# Patient Record
Sex: Male | Born: 1945 | Race: White | Hispanic: No | Marital: Single | Smoking: Never smoker
Health system: Southern US, Community
[De-identification: ages and names within clinical notes are randomized; demographics above are authoritative.]

## PROBLEM LIST (undated history)

## (undated) DIAGNOSIS — I639 Cerebral infarction, unspecified: Secondary | ICD-10-CM

## (undated) DIAGNOSIS — H269 Unspecified cataract: Secondary | ICD-10-CM

## (undated) DIAGNOSIS — N189 Chronic kidney disease, unspecified: Secondary | ICD-10-CM

## (undated) DIAGNOSIS — I10 Essential (primary) hypertension: Secondary | ICD-10-CM

## (undated) DIAGNOSIS — F329 Major depressive disorder, single episode, unspecified: Secondary | ICD-10-CM

## (undated) DIAGNOSIS — E11319 Type 2 diabetes mellitus with unspecified diabetic retinopathy without macular edema: Secondary | ICD-10-CM

## (undated) DIAGNOSIS — E119 Type 2 diabetes mellitus without complications: Secondary | ICD-10-CM

## (undated) DIAGNOSIS — F32A Depression, unspecified: Secondary | ICD-10-CM

## (undated) HISTORY — DX: Type 2 diabetes mellitus with unspecified diabetic retinopathy without macular edema: E11.319

## (undated) HISTORY — DX: Chronic kidney disease, unspecified: N18.9

## (undated) HISTORY — DX: Unspecified cataract: H26.9

## (undated) HISTORY — PX: SKIN BIOPSY: SHX1

## (undated) HISTORY — DX: Essential (primary) hypertension: I10

## (undated) HISTORY — DX: Cerebral infarction, unspecified: I63.9

## (undated) HISTORY — DX: Type 2 diabetes mellitus without complications: E11.9

## (undated) HISTORY — DX: Major depressive disorder, single episode, unspecified: F32.9

## (undated) HISTORY — PX: APPENDECTOMY: SHX54

## (undated) HISTORY — DX: Depression, unspecified: F32.A

---

## 2000-07-26 LAB — CBC AND DIFFERENTIAL
HCT: 36 % — AB (ref 41–53)
Hemoglobin: 11.8 g/dL — AB (ref 13.5–17.5)
Platelets: 228 10*3/uL (ref 150–399)
WBC: 7.6 10^3/mL

## 2015-12-29 LAB — HM DIABETES EYE EXAM

## 2016-05-04 LAB — HM COLONOSCOPY

## 2016-06-13 ENCOUNTER — Non-Acute Institutional Stay (SKILLED_NURSING_FACILITY): Payer: Medicare Other | Admitting: Internal Medicine

## 2016-06-13 ENCOUNTER — Encounter: Payer: Self-pay | Admitting: Internal Medicine

## 2016-06-13 DIAGNOSIS — N183 Chronic kidney disease, stage 3 unspecified: Secondary | ICD-10-CM | POA: Insufficient documentation

## 2016-06-13 DIAGNOSIS — N400 Enlarged prostate without lower urinary tract symptoms: Secondary | ICD-10-CM | POA: Diagnosis not present

## 2016-06-13 DIAGNOSIS — E1122 Type 2 diabetes mellitus with diabetic chronic kidney disease: Secondary | ICD-10-CM

## 2016-06-13 DIAGNOSIS — I69391 Dysphagia following cerebral infarction: Secondary | ICD-10-CM | POA: Diagnosis not present

## 2016-06-13 DIAGNOSIS — R561 Post traumatic seizures: Secondary | ICD-10-CM

## 2016-06-13 DIAGNOSIS — G8101 Flaccid hemiplegia affecting right dominant side: Secondary | ICD-10-CM

## 2016-06-13 DIAGNOSIS — E871 Hypo-osmolality and hyponatremia: Secondary | ICD-10-CM

## 2016-06-13 DIAGNOSIS — I119 Hypertensive heart disease without heart failure: Secondary | ICD-10-CM

## 2016-06-13 DIAGNOSIS — K8071 Calculus of gallbladder and bile duct without cholecystitis with obstruction: Secondary | ICD-10-CM

## 2016-06-13 DIAGNOSIS — F329 Major depressive disorder, single episode, unspecified: Secondary | ICD-10-CM | POA: Diagnosis not present

## 2016-06-13 DIAGNOSIS — N3281 Overactive bladder: Secondary | ICD-10-CM

## 2016-06-13 DIAGNOSIS — G4700 Insomnia, unspecified: Secondary | ICD-10-CM

## 2016-06-13 DIAGNOSIS — K5909 Other constipation: Secondary | ICD-10-CM

## 2016-06-13 DIAGNOSIS — L89892 Pressure ulcer of other site, stage 2: Secondary | ICD-10-CM

## 2016-06-13 DIAGNOSIS — K257 Chronic gastric ulcer without hemorrhage or perforation: Secondary | ICD-10-CM

## 2016-06-13 DIAGNOSIS — K5900 Constipation, unspecified: Secondary | ICD-10-CM

## 2016-06-13 DIAGNOSIS — D509 Iron deficiency anemia, unspecified: Secondary | ICD-10-CM | POA: Diagnosis not present

## 2016-06-13 DIAGNOSIS — G894 Chronic pain syndrome: Secondary | ICD-10-CM

## 2016-06-13 DIAGNOSIS — I69920 Aphasia following unspecified cerebrovascular disease: Secondary | ICD-10-CM

## 2016-06-13 DIAGNOSIS — E46 Unspecified protein-calorie malnutrition: Secondary | ICD-10-CM

## 2016-06-13 DIAGNOSIS — Z86718 Personal history of other venous thrombosis and embolism: Secondary | ICD-10-CM | POA: Insufficient documentation

## 2016-06-13 NOTE — Progress Notes (Signed)
LOCATION: [facility]   Code Status: Full Code  Goals of care: Advanced Directive information No flowsheet data found.   No emergency contact information on file.   Allergies  Allergen Reactions  . Latex   . Macrobid [employer] Macro]   . Morphine And Related     Chief Complaint  Patient presents with  . New Admit To SNF    New Admission     HPI:  Patient is a 70 y.o. male seen today for long term care from another SNF/ [facility] in [place]. He has PMH of hypertensive heart disease, CVA with hemiplegia, dysphagia, aphasia, post traumatic seizures, chronic pain syndrome, iron def anemia and gastric ulcer among others. He has been getting wound care to his right foot wound and completed antibiotics for it in august per monthly note at the other SNF. He has medication compliance. No fall reported.     Review of Systems:  Constitutional: Negative for fever, chills, diaphoresis.  HENT: Negative for headache, congestion, nasal discharge. Has dysphagia. Eyes: Negative for blurred vision and discharge.  Respiratory: Negative for cough, shortness of breath and wheezing.   Cardiovascular: Negative for chest pain, palpitations.  Gastrointestinal: Negative for heartburn, nausea, vomiting, abdominal pain. Last bowel movement was yesterday.  Genitourinary: Negative for dysuria and flank pain.  Musculoskeletal: Negative for back pain, fall in the facility.  Skin: Negative for itching, rash.  Neurological: Negative for dizziness Psychiatric/Behavioral: Negative for depression    Past Medical History:  Diagnosis Date  . Cataract   . CKD (chronic kidney disease)   . Depression   . Diabetes mellitus without complication (HCC)   . Diabetic retinopathy (HCC)   . Hypertension   . Stroke [facility])    History reviewed. No pertinent surgical history. Social History:   has no tobacco, alcohol, and drug history on file.  History reviewed. No pertinent family  history.  Medications:   Medication List       Accurate as of 06/13/16  2:25 PM. Always use your most recent med list.          acetaminophen 325 MG tablet Commonly known as:  TYLENOL Take 650 mg by mouth every 4 (four) hours as needed for mild pain.   acetaminophen-codeine 300-60 MG tablet Commonly known as:  TYLENOL #4 Take 1 tablet by mouth every 4 (four) hours as needed for pain.   baclofen 10 MG tablet Commonly known as:  LIORESAL Take 5 mg by mouth 3 (three) times daily.   ferrous sulfate 325 (65 FE) MG tablet Take 325 mg by mouth daily with breakfast.   finasteride 5 MG tablet Commonly known as:  PROSCAR Take 5 mg by mouth daily.   levETIRAcetam 250 MG tablet Commonly known as:  KEPPRA Take 250 mg by mouth 2 (two) times daily.   LORazepam 0.5 MG tablet Commonly known as:  ATIVAN Take 0.25 mg by mouth at bedtime.   losartan 50 MG tablet Commonly known as:  COZAAR Take 50 mg by mouth daily.   Melatonin 10 MG Tabs Take 1 tablet by mouth at bedtime.   metFORMIN 1000 MG tablet Commonly known as:  GLUCOPHAGE Take 1,000 mg by mouth 2 (two) times daily with a meal.   metoprolol tartrate 25 MG tablet Commonly known as:  LOPRESSOR Take 12.5 mg by mouth 2 (two) times daily.   multivitamin tablet Take 1 tablet by mouth daily.   omeprazole 20 MG capsule Commonly known as:  PRILOSEC Take  20 mg by mouth daily.   ondansetron 8 MG tablet Commonly known as:  ZOFRAN Take by mouth every 8 (eight) hours as needed for nausea or vomiting.   oxycodone 5 MG capsule Commonly known as:  OXY-IR Take 10 mg by mouth every 6 (six) hours as needed for pain.   pioglitazone 15 MG tablet Commonly known as:  ACTOS Take 7.5 mg by mouth daily.   polyethylene glycol packet Commonly known as:  MIRALAX / GLYCOLAX Take 17 g by mouth daily.   rivaroxaban 20 MG Tabs tablet Commonly known as:  XARELTO Take 20 mg by mouth daily.   senna 8.6 MG tablet Commonly known as:   SENOKOT Take 2 tablets by mouth 2 (two) times daily.   sertraline 100 MG tablet Commonly known as:  ZOLOFT Take 100 mg by mouth daily. Take along with 50 mg to equal 150 mg   sertraline 50 MG tablet Commonly known as:  ZOLOFT Take 50 mg by mouth daily. Take along with 100 mg to equal 150 mg   sodium chloride 1 g tablet Take 2 g by mouth 2 (two) times daily with a meal.   solifenacin 5 MG tablet Commonly known as:  VESICARE Take 5 mg by mouth daily.   sucralfate 1 g tablet Commonly known as:  CARAFATE Take 1 g by mouth 4 (four) times daily -  with meals and at bedtime.   tamsulosin 0.4 MG Caps capsule Commonly known as:  FLOMAX Take 0.4 mg by mouth daily after supper.   traZODone 50 MG tablet Commonly known as:  DESYREL Take 25 mg by mouth at bedtime.   ursodiol 500 MG tablet Commonly known as:  ACTIGALL Take 500 mg by mouth 2 (two) times daily.   vitamin B-12 1000 MCG tablet Commonly known as:  CYANOCOBALAMIN Take 1,000 mcg by mouth daily.       Immunizations:  There is no immunization history on file for this patient.   Physical Exam:  Vitals:   06/13/16 1406  BP: 127/65  Pulse: 78  Resp: 18  Temp: 98 F (36.7 C)  TempSrc: Oral  SpO2: 97%    General- elderly male, frail, in no acute distress Head- normocephalic, atraumatic Nose- no nasal discharge Throat- moist mucus membrane Eyes- PERRLA, EOMI, no pallor, no icterus, no discharge, normal conjunctiva, normal sclera Neck- no cervical lymphadenopathy Cardiovascular- normal s1,s2, + systolic murmur, trace left leg edema and 1+ right leg edema Respiratory- bilateral clear to auscultation, no wheeze, no rhonchi, no crackles, no use of accessory muscles Abdomen- bowel sounds present, soft, non tender Musculoskeletal- right sided hemiplegia present, on wheelchair, right sided spasticity present Neurological- alert and oriented to self only Skin- warm and dry, dressing to his right foot Psychiatry-  normal mood and affect    Labs reviewed: none available   Assessment/Plan   Hypertensive heart disease Without heart failure. Continue losartan 50 mg daily with metoprolol tartrate 12.5 mg bid and monitor BP and check bmp  Dysphagia SLP to evaluate, aspiration precautions  Aphasia Post CVA, to work with SLP team  Right sided hemiplegia Continue baclofen 5 mg tid for his spasticity  Dm type 2 with ckd No recent a1c for review. Monitor cbg. Continue actos 7.5 mg daily and metformin 1000 mg bid  Chronic depression Continue sertraline 150 mg daily , get psych consult. Continue lorazepam 0.25 mg qhs  BPH continue proscar 5 mg daily with tamsulosin 0.4 mg daily  OAB Continue vesicare 5 mg daily  Iron def anemia Check cbc. Continue feso4 325 mg daily  Post traumatic seizure Remains seizure free. Continue keppra 250 mg bid  Insomnia Continue trazodone 25 mg daily with his melatonin  gerd With gastric ulcer. Continue omeprazole 20 mg daily and carafate. Cbc check  Constipation continue senna 2 tab bid and miralax 17 g daily  Chronic hyponatremia Continue nacl 2 g bid and monitor bmp  ckd stage 3 Monitor bmp  Chronic pain syndrome Continue oxyIR 10 mg q6h prn pain  Cholelithiasis Continue urosidol  History of DVT On xarelto with hx of dvt for stroke prophylaxis.   Right foot ulcer Stage 2 PU to right foot, wound nurse to follow and monitor for signs of infection. Pressure ulcer prophylaxis  Protein calorie malnutrition RD to evaluate, weekly weight check   Goals of care: long term care   Labs/tests ordered: cbc, cmp, lipid panel, a1c, vitamin d  Family/ staff Communication: reviewed care plan with patient and nursing supervisor    Oneal GroutMAHIMA Orry Sigl, MD Internal Medicine [facility] [address] [place], [place] [address] Cell Phone (Monday-Friday 8 am - 5 pm): [phone] On Call: [phone] and follow  prompts after 5 pm and on weekends Office Phone: [phone] Office Fax: [phone]

## 2016-06-29 ENCOUNTER — Non-Acute Institutional Stay (SKILLED_NURSING_FACILITY): Payer: Medicare Other | Admitting: Family

## 2016-06-29 ENCOUNTER — Encounter: Payer: Self-pay | Admitting: Family

## 2016-06-29 DIAGNOSIS — M7989 Other specified soft tissue disorders: Secondary | ICD-10-CM | POA: Diagnosis not present

## 2016-06-29 NOTE — Progress Notes (Signed)
Location:  [facility] Room Number: 1102B Place of Service:  SNF [phone]) Provider: Richarda Blade, FNP-C  Extended Emergency Contact Information Primary Emergency Contact: Kretschmer,Shirley Address: [address]          [place], [place] [address] [place] Lewis of [place] Home Phone: [phone] Mobile Phone: [phone] Relation: Spouse Secondary Emergency Contact: Everitt Lewis Address: [address]          [place], [place] [address] Gilbert Lewis of [place] Home Phone: [phone] Mobile Phone: [phone] Relation: Daughter  Code Status:  Full Code Goals of care: Advanced Directive information Advanced Directives 06/29/2016  Does patient have an advance directive? No     Chief Complaint  Patient presents with  . Acute Visit    HPI:  Pt is a 70 y.o. male seen today at [facility] for an acute visit for evaluation of right leg swelling. He has a significant medical history of HTN, CVA with hemiplegia,Aphasia,Type 2 DM, BPH, Hx DVT among other conditions. He is seen in his room today. Facility Nurse reports worsening right leg swelling.     Past Medical History:  Diagnosis Date  . Cataract   . CKD (chronic kidney disease)   . Depression   . Diabetes mellitus without complication (HCC)   . Diabetic retinopathy (HCC)   . Hypertension   . Stroke [facility])    No past surgical history on file.  Allergies  Allergen Reactions  . Latex   . Macrobid [employer] Macro]   . Morphine And Related       Medication List       Accurate as of 06/29/16  3:53 PM. Always use your most recent med list.          acetaminophen 325 MG tablet Commonly known as:  TYLENOL Take 650 mg by mouth every 4 (four) hours as needed for mild pain.   acetaminophen-codeine 300-60 MG tablet Commonly known as:  TYLENOL #4 Take 1 tablet by mouth every 4 (four) hours as needed for pain.   baclofen 10 MG tablet Commonly  known as:  LIORESAL Take 5 mg by mouth 3 (three) times daily.   ferrous sulfate 325 (65 FE) MG tablet Take 325 mg by mouth daily with breakfast.   finasteride 5 MG tablet Commonly known as:  PROSCAR Take 5 mg by mouth daily.   levETIRAcetam 250 MG tablet Commonly known as:  KEPPRA Take 250 mg by mouth 2 (two) times daily.   LORazepam 0.5 MG tablet Commonly known as:  ATIVAN Take 0.25 mg by mouth at bedtime.   losartan 50 MG tablet Commonly known as:  COZAAR Take 50 mg by mouth daily.   Melatonin 10 MG Tabs Take 1 tablet by mouth at bedtime.   metFORMIN 1000 MG tablet Commonly known as:  GLUCOPHAGE Take 1,000 mg by mouth 2 (two) times daily with a meal.   metoprolol tartrate 25 MG tablet Commonly known as:  LOPRESSOR Take 12.5 mg by mouth 2 (two) times daily.   multivitamin tablet Take 1 tablet by mouth daily.   omeprazole 20 MG capsule Commonly known as:  PRILOSEC Take 20 mg by mouth daily.   ondansetron 8 MG tablet Commonly known as:  ZOFRAN Take by mouth every 8 (eight) hours as needed for nausea or vomiting.   oxycodone 5 MG capsule Commonly known as:  OXY-IR Take 10 mg by mouth every 6 (six) hours as needed for pain.  pioglitazone 15 MG tablet Commonly known as:  ACTOS Take 7.5 mg by mouth daily.   polyethylene glycol packet Commonly known as:  MIRALAX / GLYCOLAX Take 17 g by mouth daily.   PROSTAT PO Take by mouth. 30 ml twice daily for 30 days started 06/16/16   rivaroxaban 20 MG Tabs tablet Commonly known as:  XARELTO Take 20 mg by mouth daily.   senna 8.6 MG tablet Commonly known as:  SENOKOT Take 2 tablets by mouth 2 (two) times daily.   sertraline 100 MG tablet Commonly known as:  ZOLOFT Take 100 mg by mouth daily. Take along with 50 mg to equal 150 mg   sertraline 50 MG tablet Commonly known as:  ZOLOFT Take 50 mg by mouth daily. Take along with 100 mg to equal 150 mg   sodium chloride 1 g tablet Take 2 g by mouth 2 (two) times  daily with a meal.   solifenacin 5 MG tablet Commonly known as:  VESICARE Take 5 mg by mouth daily.   sucralfate 1 g tablet Commonly known as:  CARAFATE Take 1 g by mouth 4 (four) times daily -  with meals and at bedtime.   tamsulosin 0.4 MG Caps capsule Commonly known as:  FLOMAX Take 0.4 mg by mouth daily after supper.   traZODone 50 MG tablet Commonly known as:  DESYREL Take 25 mg by mouth at bedtime.   ursodiol 500 MG tablet Commonly known as:  ACTIGALL Take 500 mg by mouth 2 (two) times daily.   vitamin B-12 1000 MCG tablet Commonly known as:  CYANOCOBALAMIN Take 1,000 mcg by mouth daily.       Review of Systems  Constitutional: Negative for activity change, appetite change, chills, fatigue and fever.  HENT: Negative for congestion, rhinorrhea, sinus pain, sinus pressure, sneezing and sore throat.   Eyes: Negative.   Respiratory: Negative for cough, chest tightness, shortness of breath and wheezing.   Cardiovascular: Positive for leg swelling. Negative for chest pain and palpitations.  Gastrointestinal: Negative for abdominal distention, abdominal pain, constipation, diarrhea, nausea and vomiting.  Genitourinary: Negative for dysuria, flank pain, frequency and urgency.  Musculoskeletal: Positive for gait problem.  Skin: Negative for color change, pallor and rash.  Neurological: Negative for dizziness, seizures and headaches.  Hematological: Does not bruise/bleed easily.  Psychiatric/Behavioral: Negative for agitation, confusion, hallucinations and sleep disturbance. The patient is not nervous/anxious.     Immunization History  Administered Date(s) Administered  . Influenza-Unspecified 07/15/2015  . Pneumococcal-Unspecified 09/02/2013   Pertinent  Health Maintenance Due  Topic Date Due  . HEMOGLOBIN A1C  02-12-1946  . FOOT EXAM  07/18/1957  . OPHTHALMOLOGY EXAM  07/18/1957  . COLONOSCOPY  07/18/1997  . PNA vac Low Risk Adult (1 of 2 - PCV13) 07/18/2012  .  INFLUENZA VACCINE  05/03/2016    Vitals:   06/29/16 1529  BP: 140/74  Pulse: 70  Resp: 20  Temp: 98.8 F (37.1 C)  SpO2: 97%  Weight: 187 lb (84.8 kg)  Height: 6\' 4"  (1.93 m)   Body mass index is 22.76 kg/m. Physical Exam  Constitutional: He appears well-developed and well-nourished. No distress.  HENT:  Head: Normocephalic.  Mouth/Throat: Oropharynx is clear and moist. No oropharyngeal exudate.  Aphasia   Eyes: Conjunctivae and EOM are normal. Pupils are equal, round, and reactive to light. Right eye exhibits no discharge. Left eye exhibits no discharge. No scleral icterus.  Neck: Neck supple. No JVD present.  Cardiovascular: Normal rate, regular rhythm and  intact distal pulses.  Exam reveals no gallop and no friction rub.   No murmur heard. Pulmonary/Chest: Breath sounds normal. No respiratory distress. He has no wheezes. He has no rales.  Abdominal: Soft. Bowel sounds are normal. He exhibits no distension. There is no tenderness. There is no rebound and no guarding.  Musculoskeletal: He exhibits no tenderness or deformity.  Moves x 4 extremities. Right leg non-pitting edema  Lymphadenopathy:    He has no cervical adenopathy.  Neurological: He is alert.  Aphasia   Skin: Skin is warm and dry. No rash noted. No erythema.  Psychiatric: He has a normal mood and affect.    Assessment/Plan Right leg swelling Non-pitting edema, skin warm, non-tender to touch. Will obtain doppler studies to rule out DVT.     Family/ staff Communication: Reviewed plan of care with patient and facility Nurse supervisor.   Labs/tests ordered: doppler studies to rule out DVT.

## 2016-06-30 ENCOUNTER — Emergency Department (HOSPITAL_COMMUNITY): Payer: Medicare Other

## 2016-06-30 ENCOUNTER — Inpatient Hospital Stay (HOSPITAL_COMMUNITY)
Admission: EM | Admit: 2016-06-30 | Discharge: 2016-07-03 | DRG: 301 | Disposition: A | Discharge status: Skilled Nursing Facility | Payer: Medicare Other | Attending: Internal Medicine | Admitting: Internal Medicine

## 2016-06-30 ENCOUNTER — Encounter (HOSPITAL_COMMUNITY): Payer: Self-pay | Admitting: *Deleted

## 2016-06-30 DIAGNOSIS — Z7901 Long term (current) use of anticoagulants: Secondary | ICD-10-CM

## 2016-06-30 DIAGNOSIS — E11319 Type 2 diabetes mellitus with unspecified diabetic retinopathy without macular edema: Secondary | ICD-10-CM | POA: Diagnosis present

## 2016-06-30 DIAGNOSIS — I1310 Hypertensive heart and chronic kidney disease without heart failure, with stage 1 through stage 4 chronic kidney disease, or unspecified chronic kidney disease: Secondary | ICD-10-CM | POA: Diagnosis present

## 2016-06-30 DIAGNOSIS — R4781 Slurred speech: Secondary | ICD-10-CM | POA: Diagnosis not present

## 2016-06-30 DIAGNOSIS — Z7984 Long term (current) use of oral hypoglycemic drugs: Secondary | ICD-10-CM

## 2016-06-30 DIAGNOSIS — Z86718 Personal history of other venous thrombosis and embolism: Secondary | ICD-10-CM | POA: Diagnosis not present

## 2016-06-30 DIAGNOSIS — E1122 Type 2 diabetes mellitus with diabetic chronic kidney disease: Secondary | ICD-10-CM | POA: Diagnosis present

## 2016-06-30 DIAGNOSIS — L8990 Pressure ulcer of unspecified site, unspecified stage: Secondary | ICD-10-CM | POA: Insufficient documentation

## 2016-06-30 DIAGNOSIS — M79604 Pain in right leg: Secondary | ICD-10-CM | POA: Diagnosis present

## 2016-06-30 DIAGNOSIS — K743 Primary biliary cirrhosis: Secondary | ICD-10-CM | POA: Diagnosis present

## 2016-06-30 DIAGNOSIS — I82441 Acute embolism and thrombosis of right tibial vein: Secondary | ICD-10-CM | POA: Diagnosis not present

## 2016-06-30 DIAGNOSIS — F329 Major depressive disorder, single episode, unspecified: Secondary | ICD-10-CM | POA: Diagnosis present

## 2016-06-30 DIAGNOSIS — I82409 Acute embolism and thrombosis of unspecified deep veins of unspecified lower extremity: Secondary | ICD-10-CM | POA: Diagnosis present

## 2016-06-30 DIAGNOSIS — R609 Edema, unspecified: Secondary | ICD-10-CM | POA: Diagnosis not present

## 2016-06-30 DIAGNOSIS — I82401 Acute embolism and thrombosis of unspecified deep veins of right lower extremity: Secondary | ICD-10-CM

## 2016-06-30 DIAGNOSIS — N400 Enlarged prostate without lower urinary tract symptoms: Secondary | ICD-10-CM | POA: Diagnosis present

## 2016-06-30 DIAGNOSIS — N183 Chronic kidney disease, stage 3 unspecified: Secondary | ICD-10-CM | POA: Diagnosis present

## 2016-06-30 DIAGNOSIS — I119 Hypertensive heart disease without heart failure: Secondary | ICD-10-CM | POA: Diagnosis present

## 2016-06-30 DIAGNOSIS — E11621 Type 2 diabetes mellitus with foot ulcer: Secondary | ICD-10-CM | POA: Diagnosis present

## 2016-06-30 DIAGNOSIS — I69320 Aphasia following cerebral infarction: Secondary | ICD-10-CM | POA: Diagnosis not present

## 2016-06-30 DIAGNOSIS — H269 Unspecified cataract: Secondary | ICD-10-CM | POA: Diagnosis present

## 2016-06-30 DIAGNOSIS — R479 Unspecified speech disturbances: Secondary | ICD-10-CM

## 2016-06-30 DIAGNOSIS — G40909 Epilepsy, unspecified, not intractable, without status epilepticus: Secondary | ICD-10-CM | POA: Diagnosis present

## 2016-06-30 DIAGNOSIS — D509 Iron deficiency anemia, unspecified: Secondary | ICD-10-CM | POA: Diagnosis present

## 2016-06-30 LAB — BRAIN NATRIURETIC PEPTIDE: B Natriuretic Peptide: 57.3 pg/mL (ref 0.0–100.0)

## 2016-06-30 LAB — PROTIME-INR
INR: 1.2
Prothrombin Time: 15.3 seconds — ABNORMAL HIGH (ref 11.4–15.2)

## 2016-06-30 LAB — CBC WITH DIFFERENTIAL/PLATELET
BASOS ABS: 0 10*3/uL (ref 0.0–0.1)
Basophils Relative: 0 %
EOS ABS: 0.1 10*3/uL (ref 0.0–0.7)
EOS PCT: 1 %
HCT: 37.1 % — ABNORMAL LOW (ref 39.0–52.0)
Hemoglobin: 12 g/dL — ABNORMAL LOW (ref 13.0–17.0)
LYMPHS PCT: 27 %
Lymphs Abs: 2 10*3/uL (ref 0.7–4.0)
MCH: 28.4 pg (ref 26.0–34.0)
MCHC: 32.3 g/dL (ref 30.0–36.0)
MCV: 87.7 fL (ref 78.0–100.0)
Monocytes Absolute: 0.6 10*3/uL (ref 0.1–1.0)
Monocytes Relative: 8 %
NEUTROS PCT: 64 %
Neutro Abs: 4.9 10*3/uL (ref 1.7–7.7)
PLATELETS: 253 10*3/uL (ref 150–400)
RBC: 4.23 MIL/uL (ref 4.22–5.81)
RDW: 13.4 % (ref 11.5–15.5)
WBC: 7.6 10*3/uL (ref 4.0–10.5)

## 2016-06-30 LAB — COMPREHENSIVE METABOLIC PANEL
ALT: 13 U/L — ABNORMAL LOW (ref 17–63)
AST: 18 U/L (ref 15–41)
Albumin: 3.5 g/dL (ref 3.5–5.0)
Alkaline Phosphatase: 142 U/L — ABNORMAL HIGH (ref 38–126)
Anion gap: 8 (ref 5–15)
BILIRUBIN TOTAL: 0.6 mg/dL (ref 0.3–1.2)
BUN: 19 mg/dL (ref 6–20)
CO2: 28 mmol/L (ref 22–32)
CREATININE: 0.87 mg/dL (ref 0.61–1.24)
Calcium: 9.2 mg/dL (ref 8.9–10.3)
Chloride: 100 mmol/L — ABNORMAL LOW (ref 101–111)
Glucose, Bld: 122 mg/dL — ABNORMAL HIGH (ref 65–99)
POTASSIUM: 4.3 mmol/L (ref 3.5–5.1)
Sodium: 136 mmol/L (ref 135–145)
TOTAL PROTEIN: 6.6 g/dL (ref 6.5–8.1)

## 2016-06-30 LAB — GLUCOSE, CAPILLARY: GLUCOSE-CAPILLARY: 91 mg/dL (ref 65–99)

## 2016-06-30 LAB — APTT: aPTT: 29 seconds (ref 24–36)

## 2016-06-30 LAB — TROPONIN I: Troponin I: <0.03 ng/mL (ref <0.03)

## 2016-06-30 LAB — HEPARIN LEVEL (UNFRACTIONATED): Heparin Unfractionated: 1.28 IU/mL — ABNORMAL HIGH (ref 0.30–0.70)

## 2016-06-30 MED ORDER — OXYCODONE HCL 5 MG PO TABS
10.0000 mg | ORAL_TABLET | Freq: Four times a day (QID) | ORAL | Status: DC | PRN
  Administered 2016-07-03: 10 mg via ORAL
  Filled 2016-06-30: qty 2

## 2016-06-30 MED ORDER — INSULIN ASPART 100 UNIT/ML ~~LOC~~ SOLN
0.0000 [IU] | Freq: Three times a day (TID) | SUBCUTANEOUS | Status: DC
  Administered 2016-07-01: 1 [IU] via SUBCUTANEOUS
  Administered 2016-07-01: 2 [IU] via SUBCUTANEOUS
  Administered 2016-07-02: 1 [IU] via SUBCUTANEOUS
  Administered 2016-07-02 – 2016-07-03 (×2): 2 [IU] via SUBCUTANEOUS

## 2016-06-30 MED ORDER — PIOGLITAZONE HCL 15 MG PO TABS
7.5000 mg | ORAL_TABLET | Freq: Every day | ORAL | Status: DC
  Administered 2016-07-01 – 2016-07-03 (×2): 7.5 mg via ORAL
  Filled 2016-06-30 (×3): qty 0.5

## 2016-06-30 MED ORDER — TRAZODONE HCL 50 MG PO TABS: 25.0000 mg | ORAL_TABLET | Freq: Every evening | ORAL | Status: DC | PRN

## 2016-06-30 MED ORDER — URSODIOL 500 MG PO TABS: 500.0000 mg | ORAL_TABLET | Freq: Two times a day (BID) | ORAL | Status: DC

## 2016-06-30 MED ORDER — INSULIN ASPART 100 UNIT/ML ~~LOC~~ SOLN: 0.0000 [IU] | Freq: Every day | SUBCUTANEOUS | Status: DC

## 2016-06-30 MED ORDER — PRO-STAT SUGAR FREE PO LIQD
30.0000 mL | Freq: Two times a day (BID) | ORAL | Status: DC
  Administered 2016-07-01 – 2016-07-03 (×4): 30 mL via ORAL
  Filled 2016-06-30 (×5): qty 30

## 2016-06-30 MED ORDER — SODIUM CHLORIDE 0.9% FLUSH: 3.0000 mL | INTRAVENOUS | Status: DC | PRN

## 2016-06-30 MED ORDER — TAMSULOSIN HCL 0.4 MG PO CAPS
0.4000 mg | ORAL_CAPSULE | Freq: Every day | ORAL | Status: DC
  Administered 2016-07-01 – 2016-07-02 (×2): 0.4 mg via ORAL
  Filled 2016-06-30 (×2): qty 1

## 2016-06-30 MED ORDER — DARIFENACIN HYDROBROMIDE ER 7.5 MG PO TB24
7.5000 mg | ORAL_TABLET | Freq: Every day | ORAL | Status: DC
  Administered 2016-07-01 – 2016-07-03 (×2): 7.5 mg via ORAL
  Filled 2016-06-30 (×3): qty 1

## 2016-06-30 MED ORDER — SERTRALINE HCL 50 MG PO TABS
150.0000 mg | ORAL_TABLET | Freq: Every day | ORAL | Status: DC
  Administered 2016-07-01: 50 mg via ORAL
  Administered 2016-07-03: 150 mg via ORAL
  Filled 2016-06-30 (×3): qty 1

## 2016-06-30 MED ORDER — ACETAMINOPHEN-CODEINE #3 300-30 MG PO TABS
1.0000 | ORAL_TABLET | ORAL | Status: DC | PRN
  Administered 2016-07-02: 1 via ORAL
  Filled 2016-06-30: qty 1

## 2016-06-30 MED ORDER — LOSARTAN POTASSIUM 50 MG PO TABS
50.0000 mg | ORAL_TABLET | Freq: Every day | ORAL | Status: DC
  Administered 2016-07-01 – 2016-07-03 (×2): 50 mg via ORAL
  Filled 2016-06-30 (×3): qty 1

## 2016-06-30 MED ORDER — SUCRALFATE 1 G PO TABS
1.0000 g | ORAL_TABLET | Freq: Three times a day (TID) | ORAL | Status: DC
  Administered 2016-07-01 – 2016-07-03 (×9): 1 g via ORAL
  Filled 2016-06-30 (×9): qty 1

## 2016-06-30 MED ORDER — SODIUM CHLORIDE 0.9% FLUSH
3.0000 mL | Freq: Two times a day (BID) | INTRAVENOUS | Status: DC
  Administered 2016-07-01 – 2016-07-02 (×2): 3 mL via INTRAVENOUS

## 2016-06-30 MED ORDER — SODIUM CHLORIDE 0.9 % IV SOLN: 250.0000 mL | INTRAVENOUS | Status: DC | PRN

## 2016-06-30 MED ORDER — IOPAMIDOL (ISOVUE-370) INJECTION 76%
INTRAVENOUS | Status: AC
  Administered 2016-06-30: 100 mL
  Filled 2016-06-30: qty 100

## 2016-06-30 MED ORDER — HEPARIN (PORCINE) IN NACL 100-0.45 UNIT/ML-% IJ SOLN
1300.0000 [IU]/h | INTRAMUSCULAR | Status: DC
  Administered 2016-06-30: 1350 [IU]/h via INTRAVENOUS
  Administered 2016-07-01: 1450 [IU]/h via INTRAVENOUS
  Administered 2016-07-03: 1300 [IU]/h via INTRAVENOUS
  Filled 2016-06-30 (×4): qty 250

## 2016-06-30 MED ORDER — SODIUM CHLORIDE 0.9% FLUSH
3.0000 mL | Freq: Two times a day (BID) | INTRAVENOUS | Status: DC
  Administered 2016-07-01 – 2016-07-02 (×3): 3 mL via INTRAVENOUS

## 2016-06-30 MED ORDER — MELATONIN 10 MG PO TABS: 10.0000 mg | ORAL_TABLET | Freq: Every day | ORAL | Status: DC

## 2016-06-30 MED ORDER — METOPROLOL TARTRATE 12.5 MG HALF TABLET
12.5000 mg | ORAL_TABLET | Freq: Two times a day (BID) | ORAL | Status: DC
  Administered 2016-07-01 – 2016-07-03 (×5): 12.5 mg via ORAL
  Filled 2016-06-30 (×6): qty 1

## 2016-06-30 MED ORDER — SENNA 8.6 MG PO TABS
2.0000 | ORAL_TABLET | Freq: Two times a day (BID) | ORAL | Status: DC
  Administered 2016-07-01 – 2016-07-03 (×5): 17.2 mg via ORAL
  Filled 2016-06-30 (×6): qty 2

## 2016-06-30 MED ORDER — ACETAMINOPHEN 650 MG RE SUPP: 650.0000 mg | Freq: Four times a day (QID) | RECTAL | Status: DC | PRN

## 2016-06-30 MED ORDER — BACLOFEN 10 MG PO TABS
10.0000 mg | ORAL_TABLET | Freq: Three times a day (TID) | ORAL | Status: DC
  Administered 2016-07-01 – 2016-07-03 (×7): 10 mg via ORAL
  Filled 2016-06-30 (×10): qty 1

## 2016-06-30 MED ORDER — MELATONIN 3 MG PO TABS
9.0000 mg | ORAL_TABLET | Freq: Every day | ORAL | Status: DC
  Administered 2016-07-01 – 2016-07-02 (×3): 9 mg via ORAL
  Filled 2016-06-30 (×3): qty 3

## 2016-06-30 MED ORDER — FINASTERIDE 5 MG PO TABS
5.0000 mg | ORAL_TABLET | Freq: Every evening | ORAL | Status: DC
  Administered 2016-07-01 – 2016-07-02 (×3): 5 mg via ORAL
  Filled 2016-06-30 (×2): qty 1

## 2016-06-30 MED ORDER — FERROUS SULFATE 325 (65 FE) MG PO TABS
325.0000 mg | ORAL_TABLET | Freq: Every day | ORAL | Status: DC
  Administered 2016-07-01 – 2016-07-03 (×3): 325 mg via ORAL
  Filled 2016-06-30 (×3): qty 1

## 2016-06-30 MED ORDER — LORAZEPAM 0.5 MG PO TABS
0.2500 mg | ORAL_TABLET | Freq: Every day | ORAL | Status: DC
  Administered 2016-07-01 – 2016-07-02 (×3): 0.25 mg via ORAL
  Filled 2016-06-30 (×3): qty 1

## 2016-06-30 MED ORDER — ADULT MULTIVITAMIN W/MINERALS CH
1.0000 | ORAL_TABLET | Freq: Every day | ORAL | Status: DC
  Administered 2016-07-01 – 2016-07-03 (×2): 1 via ORAL
  Filled 2016-06-30 (×3): qty 1

## 2016-06-30 MED ORDER — ACETAMINOPHEN-CODEINE 300-60 MG PO TABS: 1.0000 | ORAL_TABLET | ORAL | Status: DC | PRN

## 2016-06-30 MED ORDER — LEVETIRACETAM 250 MG PO TABS
250.0000 mg | ORAL_TABLET | Freq: Two times a day (BID) | ORAL | Status: DC
  Administered 2016-07-01 – 2016-07-02 (×3): 250 mg via ORAL
  Filled 2016-06-30 (×4): qty 1

## 2016-06-30 MED ORDER — ACETAMINOPHEN 325 MG PO TABS: 650.0000 mg | ORAL_TABLET | Freq: Four times a day (QID) | ORAL | Status: DC | PRN

## 2016-06-30 MED ORDER — LORAZEPAM 2 MG/ML IJ SOLN
1.0000 mg | Freq: Once | INTRAMUSCULAR | Status: AC
  Administered 2016-06-30: 1 mg via INTRAMUSCULAR
  Filled 2016-06-30: qty 1

## 2016-06-30 MED ORDER — PANTOPRAZOLE SODIUM 40 MG PO TBEC
40.0000 mg | DELAYED_RELEASE_TABLET | Freq: Every day | ORAL | Status: DC
  Administered 2016-07-01 – 2016-07-03 (×2): 40 mg via ORAL
  Filled 2016-06-30 (×3): qty 1

## 2016-06-30 MED ORDER — ONDANSETRON HCL 4 MG PO TABS: 8.0000 mg | ORAL_TABLET | Freq: Three times a day (TID) | ORAL | Status: DC | PRN

## 2016-06-30 MED ORDER — URSODIOL 300 MG PO CAPS
600.0000 mg | ORAL_CAPSULE | Freq: Two times a day (BID) | ORAL | Status: DC
  Administered 2016-07-01 – 2016-07-03 (×5): 600 mg via ORAL
  Filled 2016-06-30 (×7): qty 2

## 2016-06-30 MED ORDER — SODIUM CHLORIDE 1 G PO TABS
2.0000 g | ORAL_TABLET | Freq: Two times a day (BID) | ORAL | Status: DC
  Administered 2016-07-01 – 2016-07-03 (×5): 2 g via ORAL
  Filled 2016-06-30 (×5): qty 2

## 2016-06-30 MED ORDER — VITAMIN B-12 1000 MCG PO TABS
1000.0000 ug | ORAL_TABLET | Freq: Every day | ORAL | Status: DC
  Administered 2016-07-01 – 2016-07-03 (×2): 1000 ug via ORAL
  Filled 2016-06-30 (×3): qty 1

## 2016-06-30 MED ORDER — POLYETHYLENE GLYCOL 3350 17 G PO PACK
17.0000 g | PACK | Freq: Every day | ORAL | Status: DC
  Administered 2016-07-01: 17 g via ORAL
  Filled 2016-06-30 (×2): qty 1

## 2016-06-30 NOTE — ED Notes (Signed)
Patient placed on 2L O2 

## 2016-06-30 NOTE — Progress Notes (Signed)
ANTICOAGULATION CONSULT NOTE - Initial Consult  Pharmacy Consult for heparin (on Xarelto PTA for DVT) Indication: rule-out pulmonary embolus  Allergies  Allergen Reactions  . Latex Other (See Comments)    Per MAR  . Macrobid [employer][Nitrofurantoin Monohyd Macro] Other (See Comments)    Per MAR  . Morphine And Related Other (See Comments)    [facility]    Patient Measurements: Height: 6\' 4"  (193 cm) Weight: 187 lb (84.8 kg) IBW/kg (Calculated) : 86.8 Heparin Dosing Weight: 85 kg   Vital Signs: Temp: 97.4 F (36.3 C) (09/28 1558) Temp Source: Oral (09/28 1558) BP: 167/85 (09/28 1558) Pulse Rate: 76 (09/28 1558)  Labs:  Recent Labs  06/30/16 1616  HGB 12.0*  HCT 37.1*  PLT 253  APTT 29  LABPROT 15.3*  INR 1.20    Estimated Creatinine Clearance: 97.5 mL/min (by C-G formula based on SCr of 0.87 mg/dL).   Medical History: Past Medical History:  Diagnosis Date  . Cataract   . CKD (chronic kidney disease)   . Depression   . Diabetes mellitus without complication (HCC)   . Diabetic retinopathy (HCC)   . Hypertension   . Stroke [facility](HCC)    Assessment: [age] yo male admitted from nursing home with leg swelling and shortness of breath. Ultrasound at facility PTA shows DVT. Patient has been on Xarelto for prior DVT - unknown if same as current finding. New concern for PE - pharmacy to dose heparin.   Last dose of Xarelto 9/27 at unknown time.   Due to possible new DVT/PE on Xarelto, heparin gtt started at without bolus. Baseline aPTT 29 and baseline heparin level 1.28.   CBC stable, with slightly low Hgb at 12. No overt signs or symptoms of bleeding noted.   Goal of Therapy:  Heparin level 0.3-0.7 units/ml aPTT 66-102 seconds Monitor platelets by anticoagulation protocol: Yes   Plan:  No bolus - PTA Xarelto  Start heparin infusion at 1350 units/hr Check anti-Xa level and aPTT in 6 hours and daily while on heparin Continue to monitor H&H and platelets  York CeriseKatherine Cook,  PharmD Pharmacy Resident  Pager [phone] 06/30/16 5:37 PM

## 2016-06-30 NOTE — ED Triage Notes (Signed)
[facility] c/o right leg pain, swelling, warmth and redness. Per EMS pt received ultrasound at facility that showed a DVT. Pt is on xarelto. Pt appears to now be SOB at times.

## 2016-06-30 NOTE — ED Notes (Signed)
Patient taken to CT.

## 2016-06-30 NOTE — H&P (Addendum)
TRH H&P   Patient Demographics:    Gilbert Lewis, is a 70 y.o. male  MRN: [id number]   DOB - 1946/01/03  Admit Date - 06/30/2016  Outpatient Primary MD for the patient is No PCP Per Patient  Dr. Glade Lloyd [facility])  Referring MD/NP/PA: Jonette Eva   Outpatient Specialists:    Patient coming from: [facility]  Chief Complaint  Patient presents with  . Leg Pain      HPI:    Gilbert Lewis  is a 70 y.o. male,  w ? Hx of DVT (at SNF), has been on xarelto prior to admission 06/10/2016 to [facility] place, ) CVA  w aphasia, dysphagia, apparently presents due to leg pain. And slight dyspnea.   Pt denies fever, chills, cough, cp, palp, n/v.   In ED, CTA chest negative for PE.  Pt on xarelto, while in ED ? Slurred speech, MRI brain pending.  Pt will be admitted for DVT, unclear how old the DVT is at this time.    Review of systems:    In addition to the HPI above,   No Fever-chills, No Headache, No changes with Vision or hearing, No problems swallowing food or Liquids, No Chest pain, Cough  No Abdominal pain, No Nausea or Vommitting, Bowel movements are regular, No Blood in stool or Urine, No dysuria, No new skin rashes or bruises, No new joints pains-aches,  No new weakness, tingling, numbness in any extremity, No recent weight gain or loss, No polyuria, polydypsia or polyphagia, No significant Mental Stressors.  A full 10 point Review of Systems was done, except as stated above, all other Review of Systems were negative.   With Past History of the following :    Past Medical History:  Diagnosis Date  . Cataract   . CKD (chronic kidney disease)   . Depression   . Diabetes mellitus without complication (HCC)   . Diabetic retinopathy (HCC)   . Hypertension   . Stroke [facility])       History reviewed. No pertinent surgical history.  Unknown due to patient being a  poor historian   Social History:     Social History  Substance Use Topics  . Smoking status: Never Smoker  . Smokeless tobacco: Never Used  . Alcohol use No     Lives - at [facility] -   Able to walk a few steps per patient   Family History :     Family History  Problem Relation Age of Onset  . Family history unknown: Yes   Unknown due to aphasia   Home Medications:   Prior to Admission medications   Medication Sig Start Date End Date Taking? Authorizing Provider  acetaminophen (TYLENOL) 325 MG tablet Take 650 mg by mouth every 4 (four) hours as needed for mild pain.   Yes Historical Provider, MD  acetaminophen-codeine (TYLENOL #  4) 300-60 MG tablet Take 1 tablet by mouth every 4 (four) hours as needed for pain.   Yes Historical Provider, MD  Amino Acids-Protein Hydrolys (FEEDING SUPPLEMENT, PRO-STAT SUGAR FREE 64,) LIQD Take 30 mLs by mouth 2 (two) times daily.   Yes Historical Provider, MD  baclofen (LIORESAL) 10 MG tablet Take 10 mg by mouth 3 (three) times daily.   Yes Historical Provider, MD  ferrous sulfate 325 (65 FE) MG tablet Take 325 mg by mouth daily with breakfast.   Yes Historical Provider, MD  finasteride (PROSCAR) 5 MG tablet Take 5 mg by mouth every evening.    Yes Historical Provider, MD  levETIRAcetam (KEPPRA) 250 MG tablet Take 250 mg by mouth 2 (two) times daily.   Yes Historical Provider, MD  LORazepam (ATIVAN) 0.5 MG tablet Take 0.25 mg by mouth at bedtime.   Yes Historical Provider, MD  losartan (COZAAR) 50 MG tablet Take 50 mg by mouth daily.   Yes Historical Provider, MD  Melatonin 10 MG TABS Take 10 mg by mouth at bedtime.    Yes Historical Provider, MD  metFORMIN (GLUCOPHAGE) 1000 MG tablet Take 1,000 mg by mouth 2 (two) times daily with a meal.   Yes Historical Provider, MD  metoprolol tartrate (LOPRESSOR) 25 MG tablet Take 12.5 mg by mouth 2 (two) times daily.   Yes Historical Provider, MD  Multiple Vitamins-Minerals (DECUBI-VITE)  CAPS Take 1 capsule by mouth daily.   Yes Historical Provider, MD  omeprazole (PRILOSEC) 20 MG capsule Take 20 mg by mouth daily.   Yes Historical Provider, MD  ondansetron (ZOFRAN) 8 MG tablet Take 8 mg by mouth every 8 (eight) hours as needed for nausea or vomiting.    Yes Historical Provider, MD  Oxycodone HCl 10 MG TABS Take 10 mg by mouth every 6 (six) hours as needed (pain).   Yes Historical Provider, MD  pioglitazone (ACTOS) 15 MG tablet Take 7.5 mg by mouth daily.   Yes Historical Provider, MD  polyethylene glycol (MIRALAX / GLYCOLAX) packet Take 17 g by mouth daily.   Yes Historical Provider, MD  rivaroxaban (XARELTO) 20 MG TABS tablet Take 20 mg by mouth daily with supper.    Yes Historical Provider, MD  senna (SENOKOT) 8.6 MG tablet Take 2 tablets by mouth 2 (two) times daily.   Yes Historical Provider, MD  sertraline (ZOLOFT) 100 MG tablet Take 150 mg by mouth daily.   Yes Historical Provider, MD  sodium chloride 1 g tablet Take 2 g by mouth 2 (two) times daily with a meal.   Yes Historical Provider, MD  solifenacin (VESICARE) 5 MG tablet Take 5 mg by mouth daily.   Yes Historical Provider, MD  sucralfate (CARAFATE) 1 g tablet Take 1 g by mouth 4 (four) times daily -  with meals and at bedtime.   Yes Historical Provider, MD  tamsulosin (FLOMAX) 0.4 MG CAPS capsule Take 0.4 mg by mouth daily after supper.   Yes Historical Provider, MD  traZODone (DESYREL) 50 MG tablet Take 25 mg by mouth at bedtime as needed for sleep.    Yes Historical Provider, MD  ursodiol (ACTIGALL) 500 MG tablet Take 500 mg by mouth 2 (two) times daily.   Yes Historical Provider, MD  vitamin B-12 (CYANOCOBALAMIN) 1000 MCG tablet Take 1,000 mcg by mouth daily.   Yes Historical Provider, MD     Allergies:     Allergies  Allergen Reactions  . Latex Other (See Comments)    Per MAR  .  Macrobid [employer][Nitrofurantoin Monohyd Macro] Other (See Comments)    Per MAR  . Morphine And Related Other (See Comments)    Per MAR       Physical Exam:   Vitals  Blood pressure 138/72, pulse 91, temperature 97.4 F (36.3 C), temperature source Oral, resp. rate 12, height 6\' 4"  (1.93 m), weight 84.8 kg (187 lb), SpO2 96 %.   1. General lying in bed in NAD,    2. Normal affect and insight, Not Suicidal or Homicidal, Awake Alert, Oriented X 3.  3. No F.N deficits, ALL C.Nerves Intact, Strength 5/5 on the left,  0/5 on the right upper and lower ext, Sensation intact all 4 extremities, Plantars down going.  + aphasia  4. Ears and Eyes appear Normal, Conjunctivae clear, PERRLA. Moist Oral Mucosa.  5. Supple Neck, No JVD, No cervical lymphadenopathy appriciated, No Carotid Bruits.  6. Symmetrical Chest wall movement, Good air movement bilaterally, CTAB.  7. RRR, No Gallops, Rubs or Murmurs, No Parasternal Heave.  8. Positive Bowel Sounds, Abdomen Soft, No tenderness, No organomegaly appriciated,No rebound -guarding or rigidity.  9.  No Cyanosis, Normal Skin Turgor, + edema of the right distal lower ext  10. Good muscle tone,  joints appear normal , no effusions, Normal ROM.  11. No Palpable Lymph Nodes in Neck or Axillae   Small 1x22mm skin ulcer on the bottom of the right foot under 5th mtp    Data Review:    CBC  Recent Labs Lab 06/30/16 1616  WBC 7.6  HGB 12.0*  HCT 37.1*  PLT 253  MCV 87.7  MCH 28.4  MCHC 32.3  RDW 13.4  LYMPHSABS 2.0  MONOABS 0.6  EOSABS 0.1  BASOSABS 0.0   ------------------------------------------------------------------------------------------------------------------  Chemistries   Recent Labs Lab 06/30/16 1616  NA 136  K 4.3  CL 100*  CO2 28  GLUCOSE 122*  BUN 19  CREATININE 0.87  CALCIUM 9.2  AST 18  ALT 13*  ALKPHOS 142*  BILITOT 0.6   ------------------------------------------------------------------------------------------------------------------ estimated creatinine clearance is 97.5 mL/min (by C-G formula based on SCr of 0.87  mg/dL). ------------------------------------------------------------------------------------------------------------------ No results for input(s): TSH, T4TOTAL, T3FREE, THYROIDAB in the last 72 hours.  Invalid input(s): FREET3  Coagulation profile  Recent Labs Lab 06/30/16 1616  INR 1.20   ------------------------------------------------------------------------------------------------------------------- No results for input(s): DDIMER in the last 72 hours. -------------------------------------------------------------------------------------------------------------------  Cardiac Enzymes  Recent Labs Lab 06/30/16 1616  TROPONINI <0.03   ------------------------------------------------------------------------------------------------------------------    Component Value Date/Time   BNP 57.3 06/30/2016 1616     ---------------------------------------------------------------------------------------------------------------  Urinalysis No results found for: COLORURINE, APPEARANCEUR, LABSPEC, PHURINE, GLUCOSEU, HGBUR, BILIRUBINUR, KETONESUR, PROTEINUR, UROBILINOGEN, NITRITE, LEUKOCYTESUR  ----------------------------------------------------------------------------------------------------------------   Imaging Results:    Ct Angio Chest Pe W And/or Wo Contrast  Result Date: 06/30/2016 CLINICAL DATA:  70 year old presenting from a nursing home with lower extremity edema and shortness of breath. By report, ultrasound performed at the facility demonstrates DVT. EXAM: CT ANGIOGRAPHY CHEST WITH CONTRAST TECHNIQUE: Multidetector CT imaging of the chest was performed using the standard protocol during bolus administration of intravenous contrast. Multiplanar CT image reconstructions and MIPs were obtained to evaluate the vascular anatomy. CONTRAST:  100 mL Isovue 370 IV. COMPARISON:  None. FINDINGS: Technical quality: Adequate. Respiratory motion blurs images of the lung bases. As the  patient was unable to raise the arms, beam hardening streak artifact is present. Cardiovascular: No filling defects within either main pulmonary artery or their branches in either lung to suggest pulmonary embolism. Heart  size upper normal to slightly enlarged. No pericardial effusion. Mild LAD coronary atherosclerosis. Compression of the right atrium by the elevated right hemidiaphragm. Thoracic and upper abdominal aorta tortuous and mildly atherosclerotic without evidence of aneurysm or dissection. Proximal great vessels widely patent with mild atherosclerosis. Mediastinum/Nodes: No pathologically enlarged mediastinal, hilar or axillary lymph nodes. No mediastinal masses. Normal-appearing esophagus. Thyroid gland normal in size containing a 7 mm nodule in the lower pole of the right lobe. Lungs/Pleura: Scar/atelectasis involving the right lower lobe due to the elevated right hemidiaphragm. Mild dependent atelectasis posteriorly in both lower lobes, as expected. Lungs otherwise clear. No pulmonary parenchymal nodules or masses. No pleural effusions. Central airways patent without significant bronchial wall thickening. Upper Abdomen: Large stool burden throughout the visualized colon. Interposition of the hepatic flexure the colon between the liver and the right hemidiaphragm. Likely benign complex cysts arising from the upper pole and mid right kidney. Musculoskeletal: Osseous demineralization. 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 Exaggeration of the usual thoracic kyphosis. Review of the MIP images confirms the above findings. IMPRESSION: 1. No evidence of pulmonary embolism. 2. Borderline heart size. Mild LAD coronary atherosclerosis. Mild thoracic and upper abdominal aortic atherosclerosis. 3. Elevation of the right hemidiaphragm with scar/atelectasis involving the right lower lobe. No acute cardiopulmonary disease otherwise. 4. Likely benign complex cysts involving the upper pole and mid right  kidney. 5. Large stool burden throughout the visualized colon. 6. 7 mm right lobe thyroid nodule which does not require further imaging follow-up. Electronically Signed   By: Hulan Saas M.D.   On: 06/30/2016 18:33      Assessment & Plan:    Principal Problem:   DVT (deep venous thrombosis), right Active Problems:   Benign hypertensive heart disease without heart failure   Type 2 diabetes mellitus with stage 3 chronic kidney disease, without long-term current use of insulin (HCC)   Anemia, iron deficiency    1. DVT while on xarelto Start on heparin iv Consider starting coumadin in the am.  Check pt , ptt, cbc   2. Anemia Check cbc in am, check ferritin, iron, tibc, b12, folate  3. Dm2 fsbs ac and qhs, iss Hold metformin due to recent dye exposure with CTA  4. 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 Slurred speech ? appears to be old per pt Check MRI brain  6. Seizure do Cont keppra  7. Bph Cont flomax Cont finasteride Cont enablex  DVT Prophylaxis Heparin -  SCDs  AM Labs Ordered, also please review Full Orders  Family Communication: Admission, patients condition and plan of care including tests being ordered have been discussed with the patient who indicate understanding and agree with the plan and Code Status.  Code Status  FULL  CODE  Likely DC to  SNF  Condition GUARDED    Consults called:   Admission status: inpatient  Time spent in minutes : 45 minutes   Pearson Grippe M.D on 06/30/2016 at 8:35 PM  Between 7am to 7pm - Pager - [phone]. After 7pm go to [email or web address] - password [facility]  [facility] - Office  [phone]

## 2016-06-30 NOTE — ED Provider Notes (Signed)
[facility]-EMERGENCY DEPT Provider Note  CSN: [id number] Arrival Date & Time: 06/30/16 @ 1545  History    Chief Complaint Chief Complaint  Patient presents with  . Leg Pain    HPI Gilbert Lewis is a 70 y.o. male. Patient presents as a transfer from nursing facility after patient had right extremity venous ultrasound for concerns of leg swelling that is been chronic and per the patient over several months. Patient endorses double ache that is diffuse in right lower family. Patient has focal thrombus in mid femoral vein. Patient has been on Xarelto 20 mg daily for unknown time and per documentation was placed on this for previous DVT. Patient endorses significant shortness of breath and endorses no chest pain or abdominal pain. Patient denies fevers and chills. Patient is a poor historian and when asked regarding other questions he states that he does not know.  Past Medical & Surgical History    Past Medical History:  Diagnosis Date  . Cataract   . CKD (chronic kidney disease)   . Depression   . Diabetes mellitus without complication (HCC)   . Diabetic retinopathy (HCC)   . Hypertension   . Stroke [facility])    Patient Active Problem List   Diagnosis Date Noted  . Pressure injury of skin 07/01/2016  . DVT (deep venous thrombosis), right 06/30/2016  . Slurred speech   . Benign hypertensive heart disease without heart failure 06/13/2016  . 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 Type 2 diabetes mellitus with stage 3 chronic kidney disease, without long-term current use of insulin (HCC) 06/13/2016  . Major depression, chronic 06/13/2016  . BPH (benign prostatic hyperplasia) 06/13/2016  . OAB (overactive bladder) 06/13/2016  . Anemia, iron deficiency 06/13/2016  . 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 Insomnia 06/13/2016  . Chronic  gastric ulcer 06/13/2016  . CKD (chronic kidney disease) stage 3, GFR 30-59 ml/min 06/13/2016  . Chronic constipation 06/13/2016  . Chronic hyponatremia 06/13/2016  . Chronic pain syndrome 06/13/2016  . Calculus of gallbladder and bile duct with obstruction without cholecystitis 06/13/2016  . History of DVT (deep vein thrombosis) 06/13/2016   History reviewed. No pertinent surgical history.  Family & Social History    Family History  Problem Relation Age of Onset  . Family history unknown: Yes   Social History  Substance Use Topics  . Smoking status: Never Smoker  . Smokeless tobacco: Never Used  . Alcohol use No    Home Medications    Prior to Admission medications   Medication Sig Start Date End Date Taking? Authorizing Provider  acetaminophen (TYLENOL) 325 MG tablet Take 650 mg by mouth every 4 (four) hours as needed for mild pain.   Yes Historical Provider, MD  Amino Acids-Protein Hydrolys (FEEDING SUPPLEMENT, PRO-STAT SUGAR FREE 64,) LIQD Take 30 mLs by mouth 2 (two) times daily.   Yes Historical Provider, MD  baclofen (LIORESAL) 10 MG tablet Take 10 mg by mouth 3 (three) times daily.   Yes Historical Provider, MD  ferrous sulfate 325 (65 FE) MG tablet Take 325 mg by mouth daily with breakfast.   Yes Historical Provider, MD  finasteride (PROSCAR) 5 MG tablet Take 5 mg by mouth every evening.    Yes Historical Provider, MD  levETIRAcetam (KEPPRA) 250 MG tablet Take 250 mg by mouth 2 (two) times daily.   Yes Historical Provider, MD  losartan (COZAAR) 50 MG tablet Take 50 mg by mouth daily.   Yes Historical Provider, MD  Melatonin 10 MG TABS Take 10 mg by mouth at bedtime.    Yes Historical Provider, MD  metFORMIN (GLUCOPHAGE) 1000 MG tablet Take 1,000 mg by mouth 2 (two) times daily with a meal.   Yes Historical Provider, MD  metoprolol tartrate (LOPRESSOR) 25 MG tablet Take 12.5 mg by mouth 2 (two) times daily.   Yes Historical Provider, MD  Multiple Vitamins-Minerals  (DECUBI-VITE) CAPS Take 1 capsule by mouth daily.   Yes Historical Provider, MD  omeprazole (PRILOSEC) 20 MG capsule Take 20 mg by mouth daily.   Yes Historical Provider, MD  ondansetron (ZOFRAN) 8 MG tablet Take 8 mg by mouth every 8 (eight) hours as needed for nausea or vomiting.    Yes Historical Provider, MD  pioglitazone (ACTOS) 15 MG tablet Take 7.5 mg by mouth daily.   Yes Historical Provider, MD  polyethylene glycol (MIRALAX / GLYCOLAX) packet Take 17 g by mouth daily.   Yes Historical Provider, MD  senna (SENOKOT) 8.6 MG tablet Take 2 tablets by mouth 2 (two) times daily.   Yes Historical Provider, MD  sertraline (ZOLOFT) 100 MG tablet Take 150 mg by mouth daily.   Yes Historical Provider, MD  sodium chloride 1 g tablet Take 2 g by mouth 2 (two) times daily with a meal.   Yes Historical Provider, MD  solifenacin (VESICARE) 5 MG tablet Take 5 mg by mouth daily.   Yes Historical Provider, MD  sucralfate (CARAFATE) 1 g tablet Take 1 g by mouth 4 (four) times daily -  with meals and at bedtime.   Yes Historical Provider, MD  tamsulosin (FLOMAX) 0.4 MG CAPS capsule Take 0.4 mg by mouth daily after supper.   Yes Historical Provider, MD  traZODone (DESYREL) 50 MG tablet Take 25 mg by mouth at bedtime as needed for sleep.    Yes Historical Provider, MD  ursodiol (ACTIGALL) 500 MG tablet Take 500 mg by mouth 2 (two) times daily.   Yes Historical Provider, MD  vitamin B-12 (CYANOCOBALAMIN) 1000 MCG tablet Take 1,000 mcg by mouth daily.   Yes Historical Provider, MD  enoxaparin (LOVENOX) 120 MG/0.8ML injection Inject 0.8 mLs (120 mg total) into the skin daily. 07/03/16   Belkys A Regalado, MD  LORazepam (ATIVAN) 0.5 MG tablet Take 0.5 tablets (0.25 mg total) by mouth at bedtime. 07/03/16   Belkys A Regalado, MD  Oxycodone HCl 10 MG TABS Take 1 tablet (10 mg total) by mouth every 6 (six) hours as needed (pain). 07/03/16   Belkys A Regalado, MD  warfarin (COUMADIN) 7.5 MG tablet Take 1 tablet (7.5 mg  total) by mouth daily at 6 PM. 07/03/16   Alba CoryBelkys A Regalado, MD    Allergies    Latex; Macrobid [nitrofurantoin monohyd macro]; and Morphine and related  I reviewed & agree with nursing's documentation on the patient's past medical, surgical, social & family histories as well as their allergies.  Review of Systems  Complete ROS obtained, and is negative except as stated in HPI.   Physical Exam  Updated Vital Signs BP (!) 145/64 (BP Location: Right Arm)   Pulse 60   Temp 97.5 F (36.4 C) (Oral)   Resp 12   Ht 6\' 4"  (1.93 m)   Wt 81.1 kg   SpO2 100%   BMI 21.78 kg/m  I have reviewed the triage vital signs and the nursing notes. Physical Exam  Constitutional:  He is oriented to person, place, and time. He appears well-developed and well-nourished.  Non-toxic appearance. He does not appear ill. No distress.  HENT:  Head: Normocephalic and atraumatic.  Right Ear: External ear normal.  Left Ear: External ear normal.  Mouth/Throat: Oropharynx is clear and moist.  Eyes: EOM are normal. Pupils are equal, round, and reactive to light. No scleral icterus.  Neck: Normal range of motion. Neck supple. No JVD present. No tracheal deviation present.  Cardiovascular: Normal rate, regular rhythm, normal heart sounds and intact distal pulses.   No murmur heard. Pulmonary/Chest: Breath sounds normal. No stridor. He is in respiratory distress (Effort increased w/ significant dyspnea). He has no wheezes. He has no rales.  Abdominal: Soft. Bowel sounds are normal. He exhibits no distension. There is no tenderness. There is no rebound and no guarding.  Musculoskeletal: Normal range of motion. He exhibits edema (RLE) and tenderness (RLE). He exhibits no deformity.  Neurological: He is alert and oriented to person, place, and time. He has normal strength and normal reflexes. No cranial nerve deficit or sensory deficit. He exhibits abnormal muscle tone (known contractures in RUE).  Right sided weakness  in upper and lower extremities known baseline from previous CVA.  Skin: Skin is warm and dry. Capillary refill takes less than 2 seconds. No rash noted. There is erythema. No pallor.  No warmth or induration or d/c or streaking erythema per RLE. No ulcers or wound or abrasions per lower extremity.  Psychiatric: He has a normal mood and affect. His behavior is normal.  Nursing note and vitals reviewed.   ED Treatments & Results   Labs (only abnormal results are displayed) Labs Reviewed  COMPREHENSIVE METABOLIC PANEL - Abnormal; Notable for the following:       Result Value   Chloride 100 (*)    Glucose, Bld 122 (*)    ALT 13 (*)    Alkaline Phosphatase 142 (*)    All other components within normal limits  CBC WITH DIFFERENTIAL/PLATELET - Abnormal; Notable for the following:    Hemoglobin 12.0 (*)    HCT 37.1 (*)    All other components within normal limits  PROTIME-INR - Abnormal; Notable for the following:    Prothrombin Time 15.3 (*)    All other components within normal limits  HEPARIN LEVEL (UNFRACTIONATED) - Abnormal; Notable for the following:    Heparin Unfractionated 1.28 (*)    All other components within normal limits  APTT - Abnormal; Notable for the following:    aPTT 58 (*)    All other components within normal limits  HEPARIN LEVEL (UNFRACTIONATED) - Abnormal; Notable for the following:    Heparin Unfractionated 0.77 (*)    All other components within normal limits  CBC - Abnormal; Notable for the following:    RBC 3.94 (*)    Hemoglobin 11.1 (*)    HCT 34.4 (*)    All other components within normal limits  COMPREHENSIVE METABOLIC PANEL - Abnormal; Notable for the following:    Sodium 134 (*)    Chloride 99 (*)    Glucose, Bld 116 (*)    Total Protein 6.2 (*)    Albumin 3.3 (*)    ALT 12 (*)    Alkaline Phosphatase 134 (*)    All other components within normal limits  LIPID PANEL - Abnormal; Notable for the following:    HDL 37 (*)    All other  components within normal limits  FERRITIN - Abnormal; Notable  for the following:    Ferritin 17 (*)    All other components within normal limits  IRON AND TIBC - Abnormal; Notable for the following:    Iron 44 (*)    Saturation Ratios 15 (*)    All other components within normal limits  HEPARIN LEVEL (UNFRACTIONATED) - Abnormal; Notable for the following:    Heparin Unfractionated 0.89 (*)    All other components within normal limits  APTT - Abnormal; Notable for the following:    aPTT 104 (*)    All other components within normal limits  GLUCOSE, CAPILLARY - Abnormal; Notable for the following:    Glucose-Capillary 116 (*)    All other components within normal limits  GLUCOSE, CAPILLARY - Abnormal; Notable for the following:    Glucose-Capillary 142 (*)    All other components within normal limits  GLUCOSE, CAPILLARY - Abnormal; Notable for the following:    Glucose-Capillary 183 (*)    All other components within normal limits  CBC - Abnormal; Notable for the following:    RBC 3.89 (*)    Hemoglobin 11.1 (*)    HCT 33.6 (*)    All other components within normal limits  APTT - Abnormal; Notable for the following:    aPTT 120 (*)    All other components within normal limits  GLUCOSE, CAPILLARY - Abnormal; Notable for the following:    Glucose-Capillary 115 (*)    All other components within normal limits  APTT - Abnormal; Notable for the following:    aPTT 70 (*)    All other components within normal limits  GLUCOSE, CAPILLARY - Abnormal; Notable for the following:    Glucose-Capillary 114 (*)    All other components within normal limits  GLUCOSE, CAPILLARY - Abnormal; Notable for the following:    Glucose-Capillary 151 (*)    All other components within normal limits  GLUCOSE, CAPILLARY - Abnormal; Notable for the following:    Glucose-Capillary 130 (*)    All other components within normal limits  CBC - Abnormal; Notable for the following:    RBC 3.93 (*)     Hemoglobin 11.3 (*)    HCT 34.5 (*)    All other components within normal limits  PROTIME-INR - Abnormal; Notable for the following:    Prothrombin Time 17.4 (*)    All other components within normal limits  GLUCOSE, CAPILLARY - Abnormal; Notable for the following:    Glucose-Capillary 145 (*)    All other components within normal limits  GLUCOSE, CAPILLARY - Abnormal; Notable for the following:    Glucose-Capillary 102 (*)    All other components within normal limits  GLUCOSE, CAPILLARY - Abnormal; Notable for the following:    Glucose-Capillary 156 (*)    All other components within normal limits  MRSA PCR SCREENING  APTT  BRAIN NATRIURETIC PEPTIDE  TROPONIN I  HEMOGLOBIN A1C  VITAMIN B12  HEMOGLOBIN A1C  GLUCOSE, CAPILLARY  PROTIME-INR  HEPARIN LEVEL (UNFRACTIONATED)  HEPARIN LEVEL (UNFRACTIONATED)  FOLATE RBC    EKG    EKG Interpretation  Date/Time:  Thursday June 30 2016 15:57:09 EDT Ventricular Rate:  76 PR Interval:    QRS Duration: 99 QT Interval:  391 QTC Calculation: 440 R Axis:   4 Text Interpretation:  Sinus rhythm Confirmed by HAVILAND MD, JULIE ([id number]) on 06/30/2016 4:18:02 PM       Radiology No results found.  Pertinent labs & imaging results that were available during my care of the patient  were personally visualized by me and considered in my medical decision making, please see chart for details.  Procedures (including critical care time) Procedures  Medications Ordered in ED Medications  iopamidol (ISOVUE-370) 76 % injection (100 mLs  Contrast Given 06/30/16 1755)  LORazepam (ATIVAN) injection 1 mg (1 mg Intramuscular Given 06/30/16 1918)  warfarin (COUMADIN) tablet 7.5 mg (7.5 mg Oral Given 07/01/16 1808)  warfarin (COUMADIN) tablet 7.5 mg (7.5 mg Oral Given 07/02/16 1820)    Initial Impression & Assessment and Plan & ED Course   Patient presents emergency department after recently been discovered to have unprovoked right lower  extremity DVT in mid [place] only diagnosed on right extremity ultrasound which I independently reviewed. Review also reveals that they were unable to visualize popliteal and distal veins along with the distal femoral vein. Patient is known to be on Xarelto documentation of nursing facility patient has been taking. Patient was originally placed on this medication for previous DVT.  Patient is not hypotensive hypoxic or tachycardic however is significantly short of breath. Consideration involves extension of DVT into pulmonary arteries.   I obtained consultation from hematology oncology for consideration of further anticoagulation given patient's high risk for pulmonary embolism at this time. Decision made to place patient on Heparin. I obtained a CT chest angiogram to evaluate for extension into PE along with other cardiac laboratory work including BNP and troponin along with EKG. Patient's EKG is unremarkable for signs of ischemia and shows no evidence of acute myocardial infarction. Patient has no arrhythmia and no evidence of right heart strain on EKG.  CTA Chest w/o acute PE. Due to unprovoked DVT of unknown chronicity on Xarelto will require admission and further evaluation of hypercoagulability.  In light of above & the complexity of the patient's medical condition, I believe the patient will require admission for continued medical intervention. ED Course in its entirety was reviewed w/ the patient. I therefore consulted the Hospitalist Service for admission & we discussed the patient's ED course & they have agreed to admit. They request no further interventions prior to the patient's transport from the ED. Patient stable for transport. Level of Care determined by the Admitting Service.   Final Clinical Impressions(s) & ED Diagnoses   1. Slurred speech   2. Speech abnormality    Patient care discussed with the attending physician, who oversaw their evaluation & treatment & voiced agreement.  Note: This document was prepared using Dragon voice recognition software and may include unintentional dictation errors.  House Officer: Jonette Eva, MD, Emergency Medicine Resident.   Jonette Eva, MD 07/04/16 1610    Jacalyn Lefevre, MD 07/05/16 [phone]

## 2016-06-30 NOTE — ED Notes (Signed)
During shift change patient was found to be confused and trying to get out of bed. Patient was aphasic with slurred speech. Hx of CVA. MD aware and 1 mg ativan given. Patient calm at this time. Denies pain.

## 2016-07-01 ENCOUNTER — Inpatient Hospital Stay (HOSPITAL_COMMUNITY): Payer: Medicare Other

## 2016-07-01 DIAGNOSIS — L8990 Pressure ulcer of unspecified site, unspecified stage: Secondary | ICD-10-CM | POA: Insufficient documentation

## 2016-07-01 LAB — COMPREHENSIVE METABOLIC PANEL
ALK PHOS: 134 U/L — AB (ref 38–126)
ALT: 12 U/L — AB (ref 17–63)
AST: 16 U/L (ref 15–41)
Albumin: 3.3 g/dL — ABNORMAL LOW (ref 3.5–5.0)
Anion gap: 8 (ref 5–15)
BILIRUBIN TOTAL: 0.7 mg/dL (ref 0.3–1.2)
BUN: 16 mg/dL (ref 6–20)
CALCIUM: 8.9 mg/dL (ref 8.9–10.3)
CHLORIDE: 99 mmol/L — AB (ref 101–111)
CO2: 27 mmol/L (ref 22–32)
CREATININE: 0.86 mg/dL (ref 0.61–1.24)
Glucose, Bld: 116 mg/dL — ABNORMAL HIGH (ref 65–99)
Potassium: 3.6 mmol/L (ref 3.5–5.1)
Sodium: 134 mmol/L — ABNORMAL LOW (ref 135–145)
TOTAL PROTEIN: 6.2 g/dL — AB (ref 6.5–8.1)

## 2016-07-01 LAB — GLUCOSE, CAPILLARY
GLUCOSE-CAPILLARY: 116 mg/dL — AB (ref 65–99)
GLUCOSE-CAPILLARY: 142 mg/dL — AB (ref 65–99)
Glucose-Capillary: 183 mg/dL — ABNORMAL HIGH (ref 65–99)
Glucose-Capillary: 115 mg/dL — ABNORMAL HIGH (ref 65–99)

## 2016-07-01 LAB — IRON AND TIBC
IRON: 44 ug/dL — AB (ref 45–182)
Saturation Ratios: 15 % — ABNORMAL LOW (ref 17.9–39.5)
TIBC: 291 ug/dL (ref 250–450)
UIBC: 247 ug/dL

## 2016-07-01 LAB — CBC
HCT: 34.4 % — ABNORMAL LOW (ref 39.0–52.0)
Hemoglobin: 11.1 g/dL — ABNORMAL LOW (ref 13.0–17.0)
MCH: 28.2 pg (ref 26.0–34.0)
MCHC: 32.3 g/dL (ref 30.0–36.0)
MCV: 87.3 fL (ref 78.0–100.0)
PLATELETS: 237 10*3/uL (ref 150–400)
RBC: 3.94 MIL/uL — ABNORMAL LOW (ref 4.22–5.81)
RDW: 13.4 % (ref 11.5–15.5)
WBC: 6.7 10*3/uL (ref 4.0–10.5)

## 2016-07-01 LAB — LIPID PANEL
CHOLESTEROL: 127 mg/dL (ref 0–200)
HDL: 37 mg/dL — ABNORMAL LOW (ref >40)
LDL Cholesterol: 78 mg/dL (ref 0–99)
Total CHOL/HDL Ratio: 3.4 RATIO
Triglycerides: 59 mg/dL (ref <150)
VLDL: 12 mg/dL (ref 0–40)

## 2016-07-01 LAB — MRSA PCR SCREENING: MRSA by PCR: NEGATIVE

## 2016-07-01 LAB — FERRITIN: FERRITIN: 17 ng/mL — AB (ref 24–336)

## 2016-07-01 LAB — VITAMIN B12: Vitamin B-12: 700 pg/mL (ref 180–914)

## 2016-07-01 LAB — APTT
APTT: 104 s — AB (ref 24–36)
APTT: 58 s — AB (ref 24–36)

## 2016-07-01 LAB — HEPARIN LEVEL (UNFRACTIONATED)
Heparin Unfractionated: 0.77 IU/mL — ABNORMAL HIGH (ref 0.30–0.70)
Heparin Unfractionated: 0.89 IU/mL — ABNORMAL HIGH (ref 0.30–0.70)

## 2016-07-01 MED ORDER — WARFARIN SODIUM 7.5 MG PO TABS
7.5000 mg | ORAL_TABLET | Freq: Once | ORAL | Status: AC
  Administered 2016-07-01: 7.5 mg via ORAL
  Filled 2016-07-01: qty 1

## 2016-07-01 MED ORDER — COUMADIN BOOK
Freq: Once | Status: DC
  Filled 2016-07-01: qty 1

## 2016-07-01 MED ORDER — WARFARIN - PHARMACIST DOSING INPATIENT: Freq: Every day | Status: DC

## 2016-07-01 NOTE — Progress Notes (Signed)
MRI not done. Patient unable to lay flat for MRI.

## 2016-07-01 NOTE — Progress Notes (Signed)
PROGRESS NOTE    Gilbert Lewis  ZOX:[id number] DOB: 1946-05-25 DOA: 06/30/2016 PCP: No PCP Per Patient   Brief Narrative: Gilbert Lewis  is a 70 y.o. male,  w ? Hx of DVT (at SNF), has been on xarelto prior to admission 06/10/2016 to [facility], ) CVA  w aphasia, dysphagia, apparently presents due to leg pain. And slight dyspnea.   Pt denies fever, chills, cough, cp, palp, n/v.   In ED, CTA chest negative for PE.  Pt on xarelto, while in ED ? Slurred speech, MRI brain pending.  Pt will be admitted for DVT, unclear how old the DVT is at this time.    Assessment & Plan:   Principal Problem:   DVT (deep venous thrombosis), right Active Problems:   Benign hypertensive heart disease without heart failure   Type 2 diabetes mellitus with stage 3 chronic kidney disease, without long-term current use of insulin (HCC)   Anemia, iron deficiency   Pressure injury of skin  1-DVT while on xarelto Continue with  heparin iv Start coumadin.  Repeat doppler  Family wishes for patient to be on coumadin.   2. Anemia Iron 44, sat 15, B 12 700  3. Dm2 fsbs ac and qhs, iss Hold metformin due to recent dye exposure with CTA  4. 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 Slurred speech ? appears to be old per pt Old finding per family.  Patient at baseline.  Cancel MRI.   6. Seizure do Cont keppra  7. BPH Cont flomax Cont finasteride Cont enablex    DVT prophylaxis: on heparin  Code Status:  Full code.  Family Communication: care discussed with wife and daughter  Disposition Plan: remain inpatient for treatment of DVT   Consultants:   none  Procedures:  doppler   Antimicrobials;  none   Subjective: Sitting in the chair.  Wife at bedside.  Patient has history of DVT last year.  He was at one time on coumadin, now on xarelto.  She does not wants him on xarelto again, whishes coumadin for patient  Per family patient has at time  slurred speech, he is at baseline at this time'  Objective: Vitals:   06/30/16 2100 06/30/16 2131 06/30/16 2222 07/01/16 0523  BP: 135/71 146/80 (!) 145/75 (!) 143/75  Pulse: 84 86 80 69  Resp: 12 13 16 18   Temp:   97.7 F (36.5 C) 97.4 F (36.3 C)  TempSrc:   Oral Oral  SpO2: 97% 98% 97% 99%  Weight:   81.7 kg (180 lb 1.9 oz) 82.2 kg (181 lb 3.5 oz)  Height:        Intake/Output Summary (Last 24 hours) at 07/01/16 1507 Last data filed at 07/01/16 0300  Gross per 24 hour  Intake              786 ml  Output                0 ml  Net              786 ml   Filed Weights   06/30/16 1553 06/30/16 2222 07/01/16 0523  Weight: 84.8 kg (187 lb) 81.7 kg (180 lb 1.9 oz) 82.2 kg (181 lb 3.5 oz)    Examination:  General exam: Appears calm and comfortable , sitting in the chair.  Respiratory system: Clear to auscultation. Respiratory effort normal. Cardiovascular system: S1 & S2 heard, RRR. No JVD, murmurs,  rubs, gallops or clicks. No pedal edema. Gastrointestinal system: Abdomen is nondistended, soft and nontender. No organomegaly or masses felt. Normal bowel sounds heard. Central nervous system: Alert and oriented. No focal neurological deficits. Extremities: edema right LE.  Skin: No rashes, lesions or ulcers     Data Reviewed: I have personally reviewed following labs and imaging studies  CBC:  Recent Labs Lab 06/30/16 1616 07/01/16 0338  WBC 7.6 6.7  NEUTROABS 4.9  --   HGB 12.0* 11.1*  HCT 37.1* 34.4*  MCV 87.7 87.3  PLT 253 237   Basic Metabolic Panel:  Recent Labs Lab 06/30/16 1616 07/01/16 0338  NA 136 134*  K 4.3 3.6  CL 100* 99*  CO2 28 27  GLUCOSE 122* 116*  BUN 19 16  CREATININE 0.87 0.86  CALCIUM 9.2 8.9   GFR: Estimated Creatinine Clearance: 95.6 mL/min (by C-G formula based on SCr of 0.86 mg/dL). Liver Function Tests:  Recent Labs Lab 06/30/16 1616 07/01/16 0338  AST 18 16  ALT 13* 12*  ALKPHOS 142* 134*  BILITOT 0.6 0.7  PROT 6.6  6.2*  ALBUMIN 3.5 3.3*   No results for input(s): LIPASE, AMYLASE in the last 168 hours. No results for input(s): AMMONIA in the last 168 hours. Coagulation Profile:  Recent Labs Lab 06/30/16 1616  INR 1.20   Cardiac Enzymes:  Recent Labs Lab 06/30/16 1616  TROPONINI <0.03   BNP (last 3 results) No results for input(s): PROBNP in the last 8760 hours. HbA1C: No results for input(s): HGBA1C in the last 72 hours. CBG:  Recent Labs Lab 06/30/16 2252 07/01/16 0652 07/01/16 1228  GLUCAP 91 116* 142*   Lipid Profile:  Recent Labs  07/01/16 0338  CHOL 127  HDL 37*  LDLCALC 78  TRIG 59  CHOLHDL 3.4   Thyroid Function Tests: No results for input(s): TSH, T4TOTAL, FREET4, T3FREE, THYROIDAB in the last 72 hours. Anemia Panel:  Recent Labs  07/01/16 0338  VITAMINB12 700  FERRITIN 17*  TIBC 291  IRON 44*   Sepsis Labs: No results for input(s): PROCALCITON, LATICACIDVEN in the last 168 hours.  Recent Results (from the past 240 hour(s))  MRSA PCR Screening     Status: None   Collection Time: 06/30/16 10:26 PM  Result Value Ref Range Status   MRSA by PCR NEGATIVE NEGATIVE Final    Comment:        The GeneXpert MRSA Assay (FDA approved for NASAL specimens only), is one component of a comprehensive MRSA colonization surveillance program. It is not intended to diagnose MRSA infection nor to guide or monitor treatment for MRSA infections.          Radiology Studies: Ct Angio Chest Pe W And/or Wo Contrast  Result Date: 06/30/2016 CLINICAL DATA:  70 year old presenting from a nursing home with lower extremity edema and shortness of breath. By report, ultrasound performed at the facility demonstrates DVT. EXAM: CT ANGIOGRAPHY CHEST WITH CONTRAST TECHNIQUE: Multidetector CT imaging of the chest was performed using the standard protocol during bolus administration of intravenous contrast. Multiplanar CT image reconstructions and MIPs were obtained to evaluate  the vascular anatomy. CONTRAST:  100 mL Isovue 370 IV. COMPARISON:  None. FINDINGS: Technical quality: Adequate. Respiratory motion blurs images of the lung bases. As the patient was unable to raise the arms, beam hardening streak artifact is present. Cardiovascular: No filling defects within either main pulmonary artery or their branches in either lung to suggest pulmonary embolism. Heart size upper normal to slightly  enlarged. No pericardial effusion. Mild LAD coronary atherosclerosis. Compression of the right atrium by the elevated right hemidiaphragm. Thoracic and upper abdominal aorta tortuous and mildly atherosclerotic without evidence of aneurysm or dissection. Proximal great vessels widely patent with mild atherosclerosis. Mediastinum/Nodes: No pathologically enlarged mediastinal, hilar or axillary lymph nodes. No mediastinal masses. Normal-appearing esophagus. Thyroid gland normal in size containing a 7 mm nodule in the lower pole of the right lobe. Lungs/Pleura: Scar/atelectasis involving the right lower lobe due to the elevated right hemidiaphragm. Mild dependent atelectasis posteriorly in both lower lobes, as expected. Lungs otherwise clear. No pulmonary parenchymal nodules or masses. No pleural effusions. Central airways patent without significant bronchial wall thickening. Upper Abdomen: Large stool burden throughout the visualized colon. Interposition of the hepatic flexure the colon between the liver and the right hemidiaphragm. Likely benign complex cysts arising from the upper pole and mid right kidney. Musculoskeletal: Osseous demineralization. 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 Exaggeration of the usual thoracic kyphosis. Review of the MIP images confirms the above findings. IMPRESSION: 1. No evidence of pulmonary embolism. 2. Borderline heart size. Mild LAD coronary atherosclerosis. Mild thoracic and upper abdominal aortic atherosclerosis. 3. Elevation of the right hemidiaphragm  with scar/atelectasis involving the right lower lobe. No acute cardiopulmonary disease otherwise. 4. Likely benign complex cysts involving the upper pole and mid right kidney. 5. Large stool burden throughout the visualized colon. 6. 7 mm right lobe thyroid nodule which does not require further imaging follow-up. Electronically Signed   By: Hulan Saashomas  Lawrence M.D.   On: 06/30/2016 18:33        Scheduled Meds: . baclofen  10 mg Oral TID  . darifenacin  7.5 mg Oral Daily  . feeding supplement (PRO-STAT SUGAR FREE 64)  30 mL Oral BID  . ferrous sulfate  325 mg Oral Q breakfast  . finasteride  5 mg Oral QPM  . insulin aspart  0-5 Units Subcutaneous QHS  . insulin aspart  0-9 Units Subcutaneous TID WC  . levETIRAcetam  250 mg Oral BID  . LORazepam  0.25 mg Oral QHS  . losartan  50 mg Oral Daily  . Melatonin  9 mg Oral QHS  . metoprolol tartrate  12.5 mg Oral BID  . multivitamin with minerals  1 tablet Oral Daily  . pantoprazole  40 mg Oral Daily  . pioglitazone  7.5 mg Oral Daily  . polyethylene glycol  17 g Oral Daily  . senna  2 tablet Oral BID  . sertraline  150 mg Oral Daily  . sodium chloride flush  3 mL Intravenous Q12H  . sodium chloride flush  3 mL Intravenous Q12H  . sodium chloride  2 g Oral BID WC  . sucralfate  1 g Oral TID WC & HS  . tamsulosin  0.4 mg Oral QPC supper  . ursodiol  600 mg Oral BID  . vitamin B-12  1,000 mcg Oral Daily   Continuous Infusions: . heparin 1,400 Units/hr (07/01/16 1121)     LOS: 1 day    Time spent: 35 minutes.     Alba Coryegalado, Hadrian Yarbrough A, MD [facility] Pager [phone] If 7PM-7AM, please contact night-coverage [email or web address] Password [email or web address] 07/01/2016, 3:07 PM

## 2016-07-01 NOTE — Progress Notes (Signed)
ANTICOAGULATION CONSULT NOTE - Initial Consult  Pharmacy Consult:  Coumadin Indication:  DVT  Allergies  Allergen Reactions  . Latex Other (See Comments)    Per MAR  . Macrobid [employer][Nitrofurantoin Monohyd Macro] Other (See Comments)    Per MAR  . Morphine And Related Other (See Comments)    [facility]    Patient Measurements: Height: 6\' 4"  (193 cm) Weight: 181 lb 3.5 oz (82.2 kg) IBW/kg (Calculated) : 86.8  Vital Signs: Temp: 97.4 F (36.3 C) (09/29 0523) Temp Source: Oral (09/29 0523) BP: 143/75 (09/29 0523) Pulse Rate: 69 (09/29 0523)  Labs:  Recent Labs  06/30/16 1616 07/01/16 0017 07/01/16 0338 07/01/16 0955 07/01/16 1002  HGB 12.0*  --  11.1*  --   --   HCT 37.1*  --  34.4*  --   --   PLT 253  --  237  --   --   APTT 29 58*  --   --  104*  LABPROT 15.3*  --   --   --   --   INR 1.20  --   --   --   --   HEPARINUNFRC 1.28* 0.77*  --  0.89*  --   CREATININE 0.87  --  0.86  --   --   TROPONINI <0.03  --   --   --   --     Estimated Creatinine Clearance: 95.6 mL/min (by C-G formula based on SCr of 0.86 mg/dL).   Medical History: Past Medical History:  Diagnosis Date  . Cataract   . CKD (chronic kidney disease)   . Depression   . Diabetes mellitus without complication (HCC)   . Diabetic retinopathy (HCC)   . Hypertension   . Stroke [facility](HCC)      Assessment: [age] YOM on Xarelto PTA for history of DVT.  Ultrasound from outside facility showed continued DVT and patient was transitioned to IV heparin for possible PE.  PE has been ruled out.  Patient continues on IV heparin and now to add Coumadin due to possible Xarelto failure.  Baseline INR 1.2, which could have been affected by Xarelto.  No overt bleeding reported.   Goal of Therapy:  INR 2-3    Plan:  - Coumadin 7.5mg  PO today - Daily PT / INR - Coumadin book   Duglas Heier D. Laney Potashang, PharmD, BCPS Pager:  [phone] 07/01/2016, 3:14 PM

## 2016-07-01 NOTE — Consult Note (Addendum)
WOC Nurse wound consult note Reason for Consult: Foot ulcer (base of 5th toe, R foot) Wound type: Full thickness Pressure Ulcer POA: No, not pressure ulcer Measurement: 0.5cm x 1cm x 0.1cm Wound bed: dry, pale Drainage (amount, consistency, odor) no drainage, no odor Periwound: dry, intact Dressing procedure/placement/frequency: I have provided nurses with orders for Cleanse with NS, pat dry, apply hydrogel, cover with NS moistened 2x2, cover with dry gauze and paper tape. We will not follow, but will remain available to this patient, to nursing, and the medical and/or surgical teams.  Please re-consult if we need to assist further.    Barnett HatterMelinda Kamora Vossler, RN-C, WTA-C Wound Treatment Associate

## 2016-07-01 NOTE — NC FL2 (Signed)
[place] MEDICAID [id number] LEVEL OF CARE SCREENING TOOL     IDENTIFICATION  Patient Name: Gilbert Lewis Birthdate: 03/01/1946 Sex: male Admission Date (Current Location): 06/30/2016  [facility] and [place] Number:  Producer, television/film/video and Address:  [facility], [address], [place], [place] [address]      Provider Number: [id number]  Attending Physician Name and Address:  Alba Cory, MD  Relative Name and Phone Number:       Current Level of Care: Hospital Recommended Level of Care: Skilled Nursing Facility Prior Approval Number:    Date Approved/Denied:   PASRR Number:    Discharge Plan: SNF    Current Diagnoses: Patient Active Problem List   Diagnosis Date Noted  . Pressure injury of skin 07/01/2016  . DVT (deep venous thrombosis), right 06/30/2016  . Slurred speech   . Benign hypertensive heart disease without heart failure 06/13/2016  . 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 Type 2 diabetes mellitus with stage 3 chronic kidney disease, without long-term current use of insulin (HCC) 06/13/2016  . Major depression, chronic (HCC) 06/13/2016  . BPH (benign prostatic hyperplasia) 06/13/2016  . OAB (overactive bladder) 06/13/2016  . Anemia, iron deficiency 06/13/2016  . 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 Insomnia 06/13/2016  . Chronic gastric ulcer 06/13/2016  . CKD (chronic kidney disease) stage 3, GFR 30-59 ml/min 06/13/2016  . Chronic constipation 06/13/2016  . Chronic hyponatremia 06/13/2016  . Chronic pain syndrome 06/13/2016  . Calculus of gallbladder and bile duct with obstruction without cholecystitis 06/13/2016  . History of DVT (deep vein thrombosis) 06/13/2016    Orientation RESPIRATION BLADDER Height & Weight     Self, Time, Situation, Place  Normal  Incontinent Weight: 181 lb 3.5 oz (82.2 kg) Height:  6\' 4"  (193 cm)  BEHAVIORAL SYMPTOMS/MOOD NEUROLOGICAL BOWEL NUTRITION STATUS   (none)  (None) Incontinent Diet (Heart health/ carb modifed )  AMBULATORY STATUS COMMUNICATION OF NEEDS Skin   Total Care Verbally PU Stage and Appropriate Care   PU Stage 2 Dressing:  (right foam dressing )                   Personal Care Assistance Level of Assistance  Bathing, Feeding, Dressing Bathing Assistance: Maximum assistance Feeding assistance: Maximum assistance Dressing Assistance: Maximum assistance     Functional Limitations Info  Sight, Hearing, Speech Sight Info: Adequate Hearing Info: Adequate Speech Info: Adequate    SPECIAL CARE FACTORS FREQUENCY                       Contractures Contractures Info: Not present    Additional Factors Info  Code Status, Insulin Sliding Scale, Allergies, Psychotropic Code Status Info: FULL Allergies Info: Latex, Macrobid Nitrofurantoin Monohyd Macro, Morphine And Related Psychotropic Info: sertraline (ZOLOFT) tablet 150 mg Dose: 150 mg Freq: Daily Route: PO Insulin Sliding Scale Info: insulin aspart (novoLOG) injection 0-9 Units Dose: 0-9 Units Freq: 3 times daily with meals Route: [place]       Current Medications (07/01/2016):  This is the current hospital active medication list Current Facility-Administered Medications  Medication Dose Route Frequency Provider Last Rate Last Dose  . 0.9 %  sodium chloride infusion  250 mL Intravenous PRN Pearson Grippe, MD      . acetaminophen (TYLENOL) tablet 650 mg  650 mg Oral  Q6H PRN Pearson Grippe, MD       Or  . acetaminophen (TYLENOL) suppository 650 mg  650 mg Rectal Q6H PRN Pearson Grippe, MD      . acetaminophen-codeine (TYLENOL #3) 300-30 MG per tablet 1 tablet  1 tablet Oral Q4H PRN Pearson Grippe, MD      . baclofen (LIORESAL) tablet 10 mg  10 mg Oral TID Pearson Grippe, MD   10 mg at 07/01/16 0950  . darifenacin (ENABLEX) 24 hr tablet 7.5 mg  7.5 mg Oral Daily  Pearson Grippe, MD   7.5 mg at 07/01/16 0950  . feeding supplement (PRO-STAT SUGAR FREE 64) liquid 30 mL  30 mL Oral BID Pearson Grippe, MD   30 mL at 07/01/16 0946  . ferrous sulfate tablet 325 mg  325 mg Oral Q breakfast Pearson Grippe, MD   325 mg at 07/01/16 0751  . finasteride (PROSCAR) tablet 5 mg  5 mg Oral QPM Pearson Grippe, MD   5 mg at 07/01/16 0009  . heparin ADULT infusion 100 units/mL (25000 units/273mL sodium chloride 0.45%)  1,400 Units/hr Intravenous Continuous Belkys A Regalado, MD 14 mL/hr at 07/01/16 1121 1,400 Units/hr at 07/01/16 1121  . insulin aspart (novoLOG) injection 0-5 Units  0-5 Units Subcutaneous QHS Pearson Grippe, MD      . insulin aspart (novoLOG) injection 0-9 Units  0-9 Units Subcutaneous TID WC Pearson Grippe, MD      . levETIRAcetam (KEPPRA) tablet 250 mg  250 mg Oral BID Pearson Grippe, MD   250 mg at 07/01/16 0950  . LORazepam (ATIVAN) tablet 0.25 mg  0.25 mg Oral QHS Pearson Grippe, MD   0.25 mg at 07/01/16 0011  . losartan (COZAAR) tablet 50 mg  50 mg Oral Daily Pearson Grippe, MD   50 mg at 07/01/16 0951  . Melatonin TABS 9 mg  9 mg Oral QHS Pearson Grippe, MD   9 mg at 07/01/16 0008  . metoprolol tartrate (LOPRESSOR) tablet 12.5 mg  12.5 mg Oral BID Pearson Grippe, MD   12.5 mg at 07/01/16 0950  . multivitamin with minerals tablet 1 tablet  1 tablet Oral Daily Pearson Grippe, MD   1 tablet at 07/01/16 0949  . ondansetron (ZOFRAN) tablet 8 mg  8 mg Oral Q8H PRN Pearson Grippe, MD      . oxyCODONE (Oxy IR/ROXICODONE) immediate release tablet 10 mg  10 mg Oral Q6H PRN Pearson Grippe, MD      . pantoprazole (PROTONIX) EC tablet 40 mg  40 mg Oral Daily Pearson Grippe, MD   40 mg at 07/01/16 1000  . pioglitazone (ACTOS) tablet 7.5 mg  7.5 mg Oral Daily Pearson Grippe, MD   7.5 mg at 07/01/16 0947  . polyethylene glycol (MIRALAX / GLYCOLAX) packet 17 g  17 g Oral Daily Pearson Grippe, MD   17 g at 07/01/16 0954  . senna (SENOKOT) tablet 17.2 mg  2 tablet Oral BID Pearson Grippe, MD   17.2 mg at 07/01/16 0949  . sertraline (ZOLOFT) tablet 150 mg  150  mg Oral Daily Pearson Grippe, MD   50 mg at 07/01/16 0947  . sodium chloride flush (NS) 0.9 % injection 3 mL  3 mL Intravenous Q12H Pearson Grippe, MD   3 mL at 07/01/16 1000  . sodium chloride flush (NS) 0.9 % injection 3 mL  3 mL Intravenous Q12H Pearson Grippe, MD   3 mL at 07/01/16 1000  . sodium chloride flush (NS) 0.9 % injection 3 mL  3 mL Intravenous PRN Pearson GrippeJames Kim, MD      . sodium chloride tablet 2 g  2 g Oral BID WC Pearson GrippeJames Kim, MD   2 g at 07/01/16 0959  . sucralfate (CARAFATE) tablet 1 g  1 g Oral TID WC & HS Pearson GrippeJames Kim, MD   1 g at 07/01/16 0948  . tamsulosin (FLOMAX) capsule 0.4 mg  0.4 mg Oral QPC supper Pearson GrippeJames Kim, MD      . traZODone (DESYREL) tablet 25 mg  25 mg Oral QHS PRN Pearson GrippeJames Kim, MD      . ursodiol (ACTIGALL) capsule 600 mg  600 mg Oral BID Pearson GrippeJames Kim, MD   600 mg at 07/01/16 0949  . vitamin B-12 (CYANOCOBALAMIN) tablet 1,000 mcg  1,000 mcg Oral Daily Pearson GrippeJames Kim, MD   1,000 mcg at 07/01/16 [id number]     Discharge Medications: Please see discharge summary for a list of discharge medications.  Relevant Imaging Results:  Relevant Lab Results:   Additional Information SSN: [id number]  Reggy EyeLaShonda A Vaniyah Lansky, LCSW

## 2016-07-01 NOTE — Progress Notes (Signed)
ANTICOAGULATION CONSULT NOTE - Follow Up Consult  Pharmacy Consult for Heparin (Xarelto on hold) Indication: DVT  Allergies  Allergen Reactions  . Latex Other (See Comments)    Per MAR  . Macrobid [employer][Nitrofurantoin Monohyd Macro] Other (See Comments)    Per MAR  . Morphine And Related Other (See Comments)    [facility]    Patient Measurements: Height: 6\' 4"  (193 cm) Weight: 180 lb 1.9 oz (81.7 kg) IBW/kg (Calculated) : 86.8  Vital Signs: Temp: 97.7 F (36.5 C) (09/28 2222) Temp Source: Oral (09/28 2222) BP: 145/75 (09/28 2222) Pulse Rate: 80 (09/28 2222)  Labs:  Recent Labs  06/30/16 1616 07/01/16 0017  HGB 12.0*  --   HCT 37.1*  --   PLT 253  --   APTT 29 58*  LABPROT 15.3*  --   INR 1.20  --   HEPARINUNFRC 1.28* 0.77*  CREATININE 0.87  --   TROPONINI <0.03  --     Estimated Creatinine Clearance: 93.9 mL/min (by C-G formula based on SCr of 0.87 mg/dL).  Assessment: Pt was initially placed on heparin due to concern for PE, CT Angio negative for PE, pt on Xarelto PTA for DVT, U/S at outside facility shows continued DVT, continuing heparin drip for now. Using aPTT to dose for now given Xarelto influence on anti-Xa levels.   Goal of Therapy:  Heparin level 0.3-0.7 units/ml aPTT 66-102 seconds Monitor platelets by anticoagulation protocol: Yes   Plan:  -Inc heparin to 1450 units/hr -1000 aPTT/HL  Abran DukeLedford, Oralia Criger 07/01/2016,1:49 AM

## 2016-07-01 NOTE — Clinical Social Work Note (Signed)
Clinical Social Work Assessment  Patient Details  Name: Gilbert Lewis MRN: [id number] Date of Birth: 06-16-1946  Date of referral:  07/01/16               Reason for consult:  Discharge Planning                Permission sought to share information with:  Facility Industrial/product designerContact Representative Permission granted to share information::     Name::        Agency::  SNFs  Relationship::     Contact Information:     Housing/Transportation Living arrangements for the past 2 months:  Skilled Nursing Facility Source of Information:  Spouse Patient Interpreter Needed:  None Criminal Activity/Legal Involvement Pertinent to Current Situation/Hospitalization:  No - Comment as needed Significant Relationships:  Spouse Lives with:  Facility Resident Do you feel safe going back to the place where you live?  Yes Need for family participation in patient care:  No (Coment)  Care giving concerns:  Patient's family reported that patient is a long term resident of [facility]. [facility]. They reported they would like CSW to explore long term bed placements.   Social Worker assessment / plan: CSW explained SNF long term bed search and placement process to the  patient's family and answered their questions. CSW explained that long term bed placement would be limited, but CSW would start search.  CSW will follow up with bed offers if any are received.  Employment status:  Retired Database administratornsurance information:  Managed Medicare PT Recommendations:  Skilled Nursing Facility Information / Referral to community resources:  Skilled Nursing Facility  Patient/Family's Response to care:  The patient's wife is happy with the care the patient has received.   Patient/Family's Understanding of and Emotional Response to Diagnosis, Current Treatment, and Prognosis:The patient's wife has a good understanding of why the patient  was admitted. She understands the care plan  and what the patient will need post discharge. Emotional Assessment Appearance:  Other (Comment Required (Unable to assess.) Attitude/Demeanor/Rapport:  Unable to Assess Affect (typically observed):  Unable to Assess Orientation:   (Unable to assess patient was receiving RN care. ) Alcohol / Substance use:  Not Applicable Psych involvement (Current and /or in the community):  No (Comment)  Discharge Needs  Concerns to be addressed:  Discharge Planning Concerns Readmission within the last 30 days:  No Current discharge risk:  Physical Impairment Barriers to Discharge:  Continued Medical Work up   Electronic Data SystemsLaShonda A Yitzel Shasteen, LCSW 07/01/2016, 3:52 PM

## 2016-07-01 NOTE — Progress Notes (Signed)
ANTICOAGULATION CONSULT NOTE - Follow Up Consult  Pharmacy Consult for Heparin (Xarelto on hold) Indication: DVT  Allergies  Allergen Reactions  . Latex Other (See Comments)    Per MAR  . Macrobid [employer][Nitrofurantoin Monohyd Macro] Other (See Comments)    Per MAR  . Morphine And Related Other (See Comments)    [facility]    Patient Measurements: Height: 6\' 4"  (193 cm) Weight: 181 lb 3.5 oz (82.2 kg) IBW/kg (Calculated) : 86.8  Vital Signs: Temp: 97.4 F (36.3 C) (09/29 0523) Temp Source: Oral (09/29 0523) BP: 143/75 (09/29 0523) Pulse Rate: 69 (09/29 0523)  Labs:  Recent Labs  06/30/16 1616 07/01/16 0017 07/01/16 0338 07/01/16 0955 07/01/16 1002  HGB 12.0*  --  11.1*  --   --   HCT 37.1*  --  34.4*  --   --   PLT 253  --  237  --   --   APTT 29 58*  --   --  104*  LABPROT 15.3*  --   --   --   --   INR 1.20  --   --   --   --   HEPARINUNFRC 1.28* 0.77*  --  0.89*  --   CREATININE 0.87  --  0.86  --   --   TROPONINI <0.03  --   --   --   --     Estimated Creatinine Clearance: 95.6 mL/min (by C-G formula based on SCr of 0.86 mg/dL).  Assessment: Pt was initially placed on heparin due to concern for PE, CT Angio negative for PE, pt on Xarelto PTA for DVT, U/S at outside facility shows continued DVT, continuing heparin drip for now. Using aPTT to dose for now given Xarelto influence on anti-Xa levels.   Goal of Therapy:  Heparin level 0.3-0.7 units/ml aPTT 66-102 seconds Monitor platelets by anticoagulation protocol: Yes   Plan:  Decrease heparin to 1400 units / hr Follow up AM labs  Thank you Okey RegalLisa Tonika Eden, PharmD [phone] 07/01/2016,11:21 AM

## 2016-07-01 NOTE — Plan of Care (Signed)
Problem: Safety: Goal: Ability to remain free from injury will improve Outcome: Progressing Bed alarm on, bed in low position.

## 2016-07-01 NOTE — Care Management Note (Signed)
Case Management Note Donn PieriniKristi Shetara Launer RN, BSN Unit 2W-Case Manager [phone]  Patient Details  Name: Gilbert OpitzBobby Lewis MRN: [id number] Date of Birth: 1946-05-16  Subjective/Objective:   Pt admitted with DVT             Action/Plan: PTA [facility]- CSW following for return to SNF when medically stable ([facility]  Expected Discharge Date:                  Expected Discharge Plan:  Skilled Nursing Facility  In-House Referral:  Clinical Social Work  Discharge planning Services  CM Consult  Post Acute Care Choice:    Choice offered to:     DME Arranged:    DME Agency:     HH Arranged:    HH Agency:     Status of Service:  Completed, signed off  If discussed at [employer] Meetings, dates discussed:    Additional Comments:  Darrold SpanWebster, Johnross Nabozny Hall, RN 07/01/2016, 3:00 PM

## 2016-07-02 ENCOUNTER — Inpatient Hospital Stay (HOSPITAL_COMMUNITY): Payer: Medicare Other

## 2016-07-02 DIAGNOSIS — R609 Edema, unspecified: Secondary | ICD-10-CM

## 2016-07-02 LAB — GLUCOSE, CAPILLARY
GLUCOSE-CAPILLARY: 114 mg/dL — AB (ref 65–99)
GLUCOSE-CAPILLARY: 151 mg/dL — AB (ref 65–99)
GLUCOSE-CAPILLARY: 130 mg/dL — AB (ref 65–99)
GLUCOSE-CAPILLARY: 145 mg/dL — AB (ref 65–99)

## 2016-07-02 LAB — HEMOGLOBIN A1C
Hgb A1c MFr Bld: 5.4 % (ref 4.8–5.6)
Hgb A1c MFr Bld: 5.4 % (ref 4.8–5.6)
MEAN PLASMA GLUCOSE: 108 mg/dL
Mean Plasma Glucose: 108 mg/dL

## 2016-07-02 LAB — PROTIME-INR
INR: 1.14
Prothrombin Time: 14.7 seconds (ref 11.4–15.2)

## 2016-07-02 LAB — CBC
HEMATOCRIT: 33.6 % — AB (ref 39.0–52.0)
HEMOGLOBIN: 11.1 g/dL — AB (ref 13.0–17.0)
MCH: 28.5 pg (ref 26.0–34.0)
MCHC: 33 g/dL (ref 30.0–36.0)
MCV: 86.4 fL (ref 78.0–100.0)
Platelets: 229 10*3/uL (ref 150–400)
RBC: 3.89 MIL/uL — AB (ref 4.22–5.81)
RDW: 13.5 % (ref 11.5–15.5)
WBC: 5.9 10*3/uL (ref 4.0–10.5)

## 2016-07-02 LAB — APTT
APTT: 120 s — AB (ref 24–36)
APTT: 70 s — AB (ref 24–36)

## 2016-07-02 LAB — HEPARIN LEVEL (UNFRACTIONATED): HEPARIN UNFRACTIONATED: 0.5 [IU]/mL (ref 0.30–0.70)

## 2016-07-02 MED ORDER — SODIUM CHLORIDE 0.9 % IV SOLN
250.0000 mg | Freq: Two times a day (BID) | INTRAVENOUS | Status: DC
  Administered 2016-07-02 – 2016-07-03 (×2): 250 mg via INTRAVENOUS
  Filled 2016-07-02 (×3): qty 2.5

## 2016-07-02 MED ORDER — WARFARIN SODIUM 7.5 MG PO TABS
7.5000 mg | ORAL_TABLET | Freq: Once | ORAL | Status: AC
  Administered 2016-07-02: 7.5 mg via ORAL
  Filled 2016-07-02: qty 1

## 2016-07-02 MED ORDER — ENSURE ENLIVE PO LIQD: 237.0000 mL | Freq: Two times a day (BID) | ORAL | Status: DC

## 2016-07-02 NOTE — Progress Notes (Signed)
VASCULAR LAB PRELIMINARY  PRELIMINARY  PRELIMINARY  PRELIMINARY  Right lower extremity venous duplex completed.    Preliminary report:  There is acute DVT noted in one of the branches of the right posterior tibial vein.  All other veins appear thrombus free.    Called report to Greeley CenterBrionna, Charity fundraiserN.  Marshal Schrecengost, RVT 07/02/2016, 8:07 AM

## 2016-07-02 NOTE — Progress Notes (Signed)
ANTICOAGULATION CONSULT NOTE - Follow Up Consult  Pharmacy Consult for Heparin / Coumadin Indication: DVT  Allergies  Allergen Reactions  . Latex Other (See Comments)    Per MAR  . Macrobid [employer][Nitrofurantoin Monohyd Macro] Other (See Comments)    Per MAR  . Morphine And Related Other (See Comments)    [facility]    Patient Measurements: Height: 6\' 4"  (193 cm) Weight: 176 lb 2.4 oz (79.9 kg) IBW/kg (Calculated) : 86.8  Vital Signs: Temp: 97.4 F (36.3 C) (09/30 1350) Temp Source: Oral (09/30 1350) BP: 134/63 (09/30 1350) Pulse Rate: 62 (09/30 1350)  Labs:  Recent Labs  06/30/16 1616 07/01/16 0017 07/01/16 0338 07/01/16 0955 07/01/16 1002 07/02/16 0508 07/02/16 1406  HGB 12.0*  --  11.1*  --   --  11.1*  --   HCT 37.1*  --  34.4*  --   --  33.6*  --   PLT 253  --  237  --   --  229  --   APTT 29 58*  --   --  104* 120* 70*  LABPROT 15.3*  --   --   --   --  14.7  --   INR 1.20  --   --   --   --  1.14  --   HEPARINUNFRC 1.28* 0.77*  --  0.89*  --   --  0.50  CREATININE 0.87  --  0.86  --   --   --   --   TROPONINI <0.03  --   --   --   --   --   --     Estimated Creatinine Clearance: 92.9 mL/min (by C-G formula based on SCr of 0.86 mg/dL).  Assessment: Pt was initially placed on heparin due to concern for PE, CT Angio negative for PE, pt on Xarelto PTA for DVT, U/S at outside facility shows continued DVT, continuing heparin drip for now. Using aPTT to dose for now given Xarelto influence on anti-Xa levels. APTT this AM is elevated  PM HL =0.50, PTT = 70 seconds (coorelating)  Goal of Therapy:  Heparin level = 0.3-0.7 Monitor platelets by anticoagulation protocol: Yes  INR = 2 to 3   Plan:  -Heparin at 1300 units / hr -Coumadin 7.5 mg po x 1 tonight -Daily heparin levek, CBC, INR  Thank you  Okey RegalLisa Lynard Postlewait, PharmD [phone] 07/02/2016,3:12 PM

## 2016-07-02 NOTE — Progress Notes (Signed)
ANTICOAGULATION CONSULT NOTE - Follow Up Consult  Pharmacy Consult for Heparin (Xarelto on hold) Indication: DVT  Allergies  Allergen Reactions  . Latex Other (See Comments)    Per MAR  . Macrobid [Nitrofurantoin Monohyd Macro] Other (See Comments)    [employer] MAR  . Morphine And Related Other (See Comments)    [facility]    Patient Measurements: Height: 6\' 4"  (193 cm) Weight: 176 lb 2.4 oz (79.9 kg) IBW/kg (Calculated) : 86.8  Vital Signs: Temp: 97.9 F (36.6 C) (09/30 0551) Temp Source: Oral (09/30 0551) BP: 146/77 (09/30 0551) Pulse Rate: 59 (09/30 0551)  Labs:  Recent Labs  06/30/16 1616 07/01/16 0017 07/01/16 0338 07/01/16 0955 07/01/16 1002 07/02/16 0508  HGB 12.0*  --  11.1*  --   --  11.1*  HCT 37.1*  --  34.4*  --   --  33.6*  PLT 253  --  237  --   --  229  APTT 29 58*  --   --  104* 120*  LABPROT 15.3*  --   --   --   --  14.7  INR 1.20  --   --   --   --  1.14  HEPARINUNFRC 1.28* 0.77*  --  0.89*  --   --   CREATININE 0.87  --  0.86  --   --   --   TROPONINI <0.03  --   --   --   --   --     Estimated Creatinine Clearance: 92.9 mL/min (by C-G formula based on SCr of 0.86 mg/dL).  Assessment: Pt was initially placed on heparin due to concern for PE, CT Angio negative for PE, pt on Xarelto PTA for DVT, U/S at outside facility shows continued DVT, continuing heparin drip for now. Using aPTT to dose for now given Xarelto influence on anti-Xa levels. APTT this AM is elevated.   Goal of Therapy:  Heparin level 0.3-0.7 units/ml aPTT 66-102 seconds Monitor platelets by anticoagulation protocol: Yes   Plan:  -Decrease heparin to 1300 units/hr -1400 aPTT/anti-Xa -Hopefully can start using anti-Xa alone later today/tomorrow  Abran DukeLedford, Encarnacion Scioneaux 07/02/2016,6:30 AM

## 2016-07-02 NOTE — Progress Notes (Signed)
PROGRESS NOTE    Gilbert OpitzBobby Madia  WGN:[id number] DOB: 1946/05/07 DOA: 06/30/2016 PCP: No PCP Per Patient   Brief Narrative: Gilbert Lewis  is a 70 y.o. male,  w ? Hx of DVT (at SNF), has been on xarelto prior to [facility], ) CVA  w aphasia, dysphagia, apparently presents due to leg pain. And slight dyspnea.   Pt denies fever, chills, cough, cp, palp, n/v.   In ED, CTA chest negative for PE.  Pt on xarelto, while in ED ? Slurred speech,. Pt will be admitted for DVT, unclear how old the DVT is at this time.    Assessment & Plan:   Principal Problem:   DVT (deep venous thrombosis), right Active Problems:   Benign hypertensive heart disease without heart failure   Type 2 diabetes mellitus with stage 3 chronic kidney disease, without long-term current use of insulin (HCC)   Anemia, iron deficiency   Pressure injury of skin  1-DVT while on xarelto Started  coumadin.  Repeated  doppler There is acute DVT noted in one of the branches of the right posterior tibial vein. Family wishes for patient to be on coumadin.  change heparin to Lovenox.   2. Anemia Iron 44, sat 15, B 12 700 Hb stable.   3. Dm2 fsbs ac and qhs, iss Hold metformin due to recent dye exposure with CTA  4. 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 Slurred speech ? appears to be old per pt Old finding per family.  Patient at baseline.  Cancel MRI.   6. Seizure do Cont keppra  7. BPH Cont flomax Cont finasteride Cont enablex    DVT prophylaxis: on heparin --lovenox.  Code Status:  Full code.  Family Communication: care discussed with daughter over phone Disposition Plan: remain inpatient for treatment of DVT   Consultants:   none  Procedures:  doppler   Antimicrobials;  none   Subjective: No new complaints.     Objective: Vitals:   07/01/16 2100 07/02/16 0039 07/02/16 0551 07/02/16 1350  BP: (!) 143/74 (!) 150/68 (!)  146/77 134/63  Pulse: 63 67 (!) 59 62  Resp: 18  18 18   Temp: 97.7 F (36.5 C)  97.9 F (36.6 C) 97.4 F (36.3 C)  TempSrc: Oral  Oral Oral  SpO2: 100%  100% 100%  Weight:   79.9 kg (176 lb 2.4 oz)   Height:       No intake or output data in the 24 hours ending 07/02/16 1415 Filed Weights   06/30/16 2222 07/01/16 0523 07/02/16 0551  Weight: 81.7 kg (180 lb 1.9 oz) 82.2 kg (181 lb 3.5 oz) 79.9 kg (176 lb 2.4 oz)    Examination:  General exam: Appears calm and comfortable , sitting in the chair.  Respiratory system: Clear to auscultation. Respiratory effort normal. Cardiovascular system: S1 & S2 heard, RRR. No JVD, murmurs, rubs, gallops or clicks. No pedal edema. Gastrointestinal system: Abdomen is nondistended, soft and nontender. No organomegaly or masses felt. Normal bowel sounds heard. Central nervous system: Alert and oriented. No focal neurological deficits. Extremities: edema right LE.  Skin: No rashes, lesions or ulcers     Data Reviewed: I have personally reviewed following labs and imaging studies  CBC:  Recent Labs Lab 06/30/16 1616 07/01/16 0338 07/02/16 0508  WBC 7.6 6.7 5.9  NEUTROABS 4.9  --   --   HGB 12.0* 11.1* 11.1*  HCT 37.1* 34.4*  33.6*  MCV 87.7 87.3 86.4  PLT 253 237 229   Basic Metabolic Panel:  Recent Labs Lab 06/30/16 1616 07/01/16 0338  NA 136 134*  K 4.3 3.6  CL 100* 99*  CO2 28 27  GLUCOSE 122* 116*  BUN 19 16  CREATININE 0.87 0.86  CALCIUM 9.2 8.9   GFR: Estimated Creatinine Clearance: 92.9 mL/min (by C-G formula based on SCr of 0.86 mg/dL). Liver Function Tests:  Recent Labs Lab 06/30/16 1616 07/01/16 0338  AST 18 16  ALT 13* 12*  ALKPHOS 142* 134*  BILITOT 0.6 0.7  PROT 6.6 6.2*  ALBUMIN 3.5 3.3*   No results for input(s): LIPASE, AMYLASE in the last 168 hours. No results for input(s): AMMONIA in the last 168 hours. Coagulation Profile:  Recent Labs Lab 06/30/16 1616 07/02/16 0508  INR 1.20 1.14    Cardiac Enzymes:  Recent Labs Lab 06/30/16 1616  TROPONINI <0.03   BNP (last 3 results) No results for input(s): PROBNP in the last 8760 hours. HbA1C:  Recent Labs  07/01/16 0017 07/01/16 0338  HGBA1C 5.4 5.4   CBG:  Recent Labs Lab 07/01/16 1228 07/01/16 1640 07/01/16 2110 07/02/16 0638 07/02/16 1055  GLUCAP 142* 183* 115* 114* 151*   Lipid Profile:  Recent Labs  07/01/16 0338  CHOL 127  HDL 37*  LDLCALC 78  TRIG 59  CHOLHDL 3.4   Thyroid Function Tests: No results for input(s): TSH, T4TOTAL, FREET4, T3FREE, THYROIDAB in the last 72 hours. Anemia Panel:  Recent Labs  07/01/16 0338  VITAMINB12 700  FERRITIN 17*  TIBC 291  IRON 44*   Sepsis Labs: No results for input(s): PROCALCITON, LATICACIDVEN in the last 168 hours.  Recent Results (from the past 240 hour(s))  MRSA PCR Screening     Status: None   Collection Time: 06/30/16 10:26 PM  Result Value Ref Range Status   MRSA by PCR NEGATIVE NEGATIVE Final    Comment:        The GeneXpert MRSA Assay (FDA approved for NASAL specimens only), is one component of a comprehensive MRSA colonization surveillance program. It is not intended to diagnose MRSA infection nor to guide or monitor treatment for MRSA infections.          Radiology Studies: Ct Angio Chest Pe W And/or Wo Contrast  Result Date: 06/30/2016 CLINICAL DATA:  70 year old presenting from a nursing home with lower extremity edema and shortness of breath. By report, ultrasound performed at the facility demonstrates DVT. EXAM: CT ANGIOGRAPHY CHEST WITH CONTRAST TECHNIQUE: Multidetector CT imaging of the chest was performed using the standard protocol during bolus administration of intravenous contrast. Multiplanar CT image reconstructions and MIPs were obtained to evaluate the vascular anatomy. CONTRAST:  100 mL Isovue 370 IV. COMPARISON:  None. FINDINGS: Technical quality: Adequate. Respiratory motion blurs images of the lung bases.  As the patient was unable to raise the arms, beam hardening streak artifact is present. Cardiovascular: No filling defects within either main pulmonary artery or their branches in either lung to suggest pulmonary embolism. Heart size upper normal to slightly enlarged. No pericardial effusion. Mild LAD coronary atherosclerosis. Compression of the right atrium by the elevated right hemidiaphragm. Thoracic and upper abdominal aorta tortuous and mildly atherosclerotic without evidence of aneurysm or dissection. Proximal great vessels widely patent with mild atherosclerosis. Mediastinum/Nodes: No pathologically enlarged mediastinal, hilar or axillary lymph nodes. No mediastinal masses. Normal-appearing esophagus. Thyroid gland normal in size containing a 7 mm nodule in the lower pole of the  right lobe. Lungs/Pleura: Scar/atelectasis involving the right lower lobe due to the elevated right hemidiaphragm. Mild dependent atelectasis posteriorly in both lower lobes, as expected. Lungs otherwise clear. No pulmonary parenchymal nodules or masses. No pleural effusions. Central airways patent without significant bronchial wall thickening. Upper Abdomen: Large stool burden throughout the visualized colon. Interposition of the hepatic flexure the colon between the liver and the right hemidiaphragm. Likely benign complex cysts arising from the upper pole and mid right kidney. Musculoskeletal: Osseous demineralization. 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 Exaggeration of the usual thoracic kyphosis. Review of the MIP images confirms the above findings. IMPRESSION: 1. No evidence of pulmonary embolism. 2. Borderline heart size. Mild LAD coronary atherosclerosis. Mild thoracic and upper abdominal aortic atherosclerosis. 3. Elevation of the right hemidiaphragm with scar/atelectasis involving the right lower lobe. No acute cardiopulmonary disease otherwise. 4. Likely benign complex cysts involving the upper pole and mid right  kidney. 5. Large stool burden throughout the visualized colon. 6. 7 mm right lobe thyroid nodule which does not require further imaging follow-up. Electronically Signed   By: Hulan Saas M.D.   On: 06/30/2016 18:33        Scheduled Meds: . baclofen  10 mg Oral TID  . coumadin book   Does not apply Once  . darifenacin  7.5 mg Oral Daily  . feeding supplement (PRO-STAT SUGAR FREE 64)  30 mL Oral BID  . ferrous sulfate  325 mg Oral Q breakfast  . finasteride  5 mg Oral QPM  . insulin aspart  0-5 Units Subcutaneous QHS  . insulin aspart  0-9 Units Subcutaneous TID WC  . levETIRAcetam  250 mg Intravenous Q12H  . LORazepam  0.25 mg Oral QHS  . losartan  50 mg Oral Daily  . Melatonin  9 mg Oral QHS  . metoprolol tartrate  12.5 mg Oral BID  . multivitamin with minerals  1 tablet Oral Daily  . pantoprazole  40 mg Oral Daily  . pioglitazone  7.5 mg Oral Daily  . polyethylene glycol  17 g Oral Daily  . senna  2 tablet Oral BID  . sertraline  150 mg Oral Daily  . sodium chloride flush  3 mL Intravenous Q12H  . sodium chloride flush  3 mL Intravenous Q12H  . sodium chloride  2 g Oral BID WC  . sucralfate  1 g Oral TID WC & HS  . tamsulosin  0.4 mg Oral QPC supper  . ursodiol  600 mg Oral BID  . vitamin B-12  1,000 mcg Oral Daily  . warfarin  7.5 mg Oral ONCE-1800  . Warfarin - Pharmacist Dosing Inpatient   Does not apply q1800   Continuous Infusions: . heparin 1,300 Units/hr (07/02/16 0636)     LOS: 2 days    Time spent: 35 minutes.     Alba Cory, MD [facility] Pager [phone] If 7PM-7AM, please contact night-coverage [email or web address] Password [email or web address] 07/02/2016, 2:15 PM

## 2016-07-02 NOTE — Progress Notes (Signed)
ANTICOAGULATION CONSULT NOTE - Follow Up Consult  Pharmacy Consult for Heparin / Coumadin Indication: DVT  Allergies  Allergen Reactions  . Latex Other (See Comments)    Per MAR  . Macrobid [employer][Nitrofurantoin Monohyd Macro] Other (See Comments)    Per MAR  . Morphine And Related Other (See Comments)    [facility]    Patient Measurements: Height: 6\' 4"  (193 cm) Weight: 176 lb 2.4 oz (79.9 kg) IBW/kg (Calculated) : 86.8  Vital Signs: Temp: 97.9 F (36.6 C) (09/30 0551) Temp Source: Oral (09/30 0551) BP: 146/77 (09/30 0551) Pulse Rate: 59 (09/30 0551)  Labs:  Recent Labs  06/30/16 1616 07/01/16 0017 07/01/16 0338 07/01/16 0955 07/01/16 1002 07/02/16 0508  HGB 12.0*  --  11.1*  --   --  11.1*  HCT 37.1*  --  34.4*  --   --  33.6*  PLT 253  --  237  --   --  229  APTT 29 58*  --   --  104* 120*  LABPROT 15.3*  --   --   --   --  14.7  INR 1.20  --   --   --   --  1.14  HEPARINUNFRC 1.28* 0.77*  --  0.89*  --   --   CREATININE 0.87  --  0.86  --   --   --   TROPONINI <0.03  --   --   --   --   --     Estimated Creatinine Clearance: 92.9 mL/min (by C-G formula based on SCr of 0.86 mg/dL).  Assessment: Pt was initially placed on heparin due to concern for PE, CT Angio negative for PE, pt on Xarelto PTA for DVT, U/S at outside facility shows continued DVT, continuing heparin drip for now. Using aPTT to dose for now given Xarelto influence on anti-Xa levels. APTT this AM is elevated.   Goal of Therapy:  Heparin level 0.3-0.7 units/ml aPTT 66-102 seconds Monitor platelets by anticoagulation protocol: Yes  INR = 2 to 3   Plan:  -Decrease heparin to 1300 units/hr -1400 labs -Coumadin 7.5 mg po x 1 tonight -Hopefully can start using anti-Xa alone later today/tomorrow  Thank you  Okey RegalLisa Jaquille Kau, PharmD [phone] 07/02/2016,11:06 AM

## 2016-07-02 NOTE — Progress Notes (Signed)
Pt wife and daughter called to help with pt care and medication administration. Will continue to monitor.

## 2016-07-02 NOTE — Progress Notes (Signed)
Pt refused morning medication. When asked the reasoning for not taking medication pt stated "No".  MD made aware. Will continue to monitor.

## 2016-07-03 LAB — CBC
HCT: 34.5 % — ABNORMAL LOW (ref 39.0–52.0)
HEMOGLOBIN: 11.3 g/dL — AB (ref 13.0–17.0)
MCH: 28.8 pg (ref 26.0–34.0)
MCHC: 32.8 g/dL (ref 30.0–36.0)
MCV: 87.8 fL (ref 78.0–100.0)
PLATELETS: 232 10*3/uL (ref 150–400)
RBC: 3.93 MIL/uL — AB (ref 4.22–5.81)
RDW: 13.4 % (ref 11.5–15.5)
WBC: 6.7 10*3/uL (ref 4.0–10.5)

## 2016-07-03 LAB — PROTIME-INR
INR: 1.42
PROTHROMBIN TIME: 17.4 s — AB (ref 11.4–15.2)

## 2016-07-03 LAB — HEPARIN LEVEL (UNFRACTIONATED): HEPARIN UNFRACTIONATED: 0.58 [IU]/mL (ref 0.30–0.70)

## 2016-07-03 LAB — GLUCOSE, CAPILLARY
GLUCOSE-CAPILLARY: 156 mg/dL — AB (ref 65–99)
Glucose-Capillary: 102 mg/dL — ABNORMAL HIGH (ref 65–99)

## 2016-07-03 MED ORDER — LEVETIRACETAM 250 MG PO TABS
250.0000 mg | ORAL_TABLET | Freq: Two times a day (BID) | ORAL | Status: DC
Start: 2016-07-03 — End: 2016-07-03
  Administered 2016-07-03: 250 mg via ORAL
  Filled 2016-07-03: qty 1

## 2016-07-03 MED ORDER — ENOXAPARIN SODIUM 120 MG/0.8ML ~~LOC~~ SOLN
120.0000 mg | SUBCUTANEOUS | Status: DC
  Administered 2016-07-03: 120 mg via SUBCUTANEOUS
  Filled 2016-07-03: qty 0.8

## 2016-07-03 MED ORDER — ENOXAPARIN SODIUM 120 MG/0.8ML ~~LOC~~ SOLN: 120.0000 mg | SUBCUTANEOUS | 0 refills | Status: DC

## 2016-07-03 MED ORDER — WARFARIN SODIUM 7.5 MG PO TABS: 7.5000 mg | ORAL_TABLET | Freq: Every day | ORAL | 0 refills | Status: DC

## 2016-07-03 MED ORDER — WARFARIN SODIUM 7.5 MG PO TABS: 7.5000 mg | ORAL_TABLET | Freq: Every day | ORAL | Status: DC

## 2016-07-03 MED ORDER — LORAZEPAM 0.5 MG PO TABS: 0.2500 mg | ORAL_TABLET | Freq: Every day | ORAL | 0 refills | Status: DC

## 2016-07-03 MED ORDER — OXYCODONE HCL 10 MG PO TABS: 10.0000 mg | ORAL_TABLET | Freq: Four times a day (QID) | ORAL | 0 refills | Status: DC | PRN

## 2016-07-03 NOTE — Progress Notes (Signed)
Patient is set to discharge to [facility] today. Patient & daughter aware. Discharge packet given to RN. [facility] called for transport.   Stacy GardnerErin Velmer Woelfel, LCSWA Clinical Social Worker [phone]

## 2016-07-03 NOTE — Discharge Summary (Signed)
Physician Discharge Summary  Shane Badeaux WUJ:[id number] DOB: 1945/12/27 DOA: 06/30/2016  PCP: No PCP Per Patient  Admit date: 06/30/2016 Discharge date: 07/03/2016  Admitted From: SNF Disposition:  SNF  Recommendations for Outpatient Follow-up:  1. Follow up with PCP in 1-2 weeks 2. Please obtain BMP/CBC in one week 3. Patient needs to be on lovenox for at least 5 days and  24 hours after  his INR is at goal.  4. Need to check INR in 24 hours and adjust coumadin as needed.     Discharge Condition; Stable.  CODE STATUS: Full code.  Diet recommendation: Heart Healthy  Brief/Interim Summary: Brief Narrative: BobbyDavisis a 70 y.o.male,w ? Hx of DVT (at SNF), has been on xarelto prior to admission 06/10/2016 to [facility], ) CVA w aphasia, dysphagia, apparently presents due to leg pain. And slight dyspnea. Pt denies fever, chills, cough, cp, palp, n/v.   In ED, CTA chest negative for PE. Pt on xarelto, while in ED ? Slurred speech,. Pt will be admitted for DVT, unclear how old the DVT is at this time.   Assessment & Plan:  1-DVT while on xarelto Started  coumadin.  Repeated  doppler There is acute DVT noted in one of the branches of the right posterior tibial vein. Family wishes for patient to be on coumadin.  Discharge on lovenox 120 daily.  Continue with coumadin adjust dose depending on INR .  Patient needs to be on lovenox for at least 5 days for bridge and  24 hours after  his INR is at goal.  Day 2 bridge.   2. Anemia Iron 44, sat 15, B 12 700 Hb stable.   3. Dm2 fsbs ac and qhs, iss Resume  metformin at discharge   4. 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 Slurred speech ? appears to be old per pt Old finding per family.  Patient at baseline.  Cancel MRI.   6. Seizure do Cont keppra  7. BPH Cont flomax Cont finasteride Cont enablex   Discharge Diagnoses:  Principal Problem:   DVT (deep  venous thrombosis), right Active Problems:   Benign hypertensive heart disease without heart failure   Type 2 diabetes mellitus with stage 3 chronic kidney disease, without long-term current use of insulin (HCC)   Anemia, iron deficiency   Pressure injury of skin    Discharge Instructions  Discharge Instructions    Diet - low sodium heart healthy    Complete by:  As directed    Increase activity slowly    Complete by:  As directed        Medication List    STOP taking these medications   acetaminophen-codeine 300-60 MG tablet Commonly known as:  TYLENOL #4   rivaroxaban 20 MG Tabs tablet Commonly known as:  XARELTO     TAKE these medications   acetaminophen 325 MG tablet Commonly known as:  TYLENOL Take 650 mg by mouth every 4 (four) hours as needed for mild pain.   baclofen 10 MG tablet Commonly known as:  LIORESAL Take 10 mg by mouth 3 (three) times daily.   DECUBI-VITE Caps Take 1 capsule by mouth daily.   enoxaparin 120 MG/0.8ML injection Commonly known as:  LOVENOX Inject 0.8 mLs (120 mg total) into the skin daily.   feeding supplement (PRO-STAT SUGAR FREE 64) Liqd Take 30 mLs by mouth 2 (two) times daily.   ferrous sulfate 325 (65 FE) MG tablet Take  325 mg by mouth daily with breakfast.   finasteride 5 MG tablet Commonly known as:  PROSCAR Take 5 mg by mouth every evening.   levETIRAcetam 250 MG tablet Commonly known as:  KEPPRA Take 250 mg by mouth 2 (two) times daily.   LORazepam 0.5 MG tablet Commonly known as:  ATIVAN Take 0.25 mg by mouth at bedtime.   losartan 50 MG tablet Commonly known as:  COZAAR Take 50 mg by mouth daily.   Melatonin 10 MG Tabs Take 10 mg by mouth at bedtime.   metFORMIN 1000 MG tablet Commonly known as:  GLUCOPHAGE Take 1,000 mg by mouth 2 (two) times daily with a meal.   metoprolol tartrate 25 MG tablet Commonly known as:  LOPRESSOR Take 12.5 mg by mouth 2 (two) times daily.   omeprazole 20 MG  capsule Commonly known as:  PRILOSEC Take 20 mg by mouth daily.   ondansetron 8 MG tablet Commonly known as:  ZOFRAN Take 8 mg by mouth every 8 (eight) hours as needed for nausea or vomiting.   Oxycodone HCl 10 MG Tabs Take 10 mg by mouth every 6 (six) hours as needed (pain).   pioglitazone 15 MG tablet Commonly known as:  ACTOS Take 7.5 mg by mouth daily.   polyethylene glycol packet Commonly known as:  MIRALAX / GLYCOLAX Take 17 g by mouth daily.   senna 8.6 MG tablet Commonly known as:  SENOKOT Take 2 tablets by mouth 2 (two) times daily.   sertraline 100 MG tablet Commonly known as:  ZOLOFT Take 150 mg by mouth daily.   sodium chloride 1 g tablet Take 2 g by mouth 2 (two) times daily with a meal.   solifenacin 5 MG tablet Commonly known as:  VESICARE Take 5 mg by mouth daily.   sucralfate 1 g tablet Commonly known as:  CARAFATE Take 1 g by mouth 4 (four) times daily -  with meals and at bedtime.   tamsulosin 0.4 MG Caps capsule Commonly known as:  FLOMAX Take 0.4 mg by mouth daily after supper.   traZODone 50 MG tablet Commonly known as:  DESYREL Take 25 mg by mouth at bedtime as needed for sleep.   ursodiol 500 MG tablet Commonly known as:  ACTIGALL Take 500 mg by mouth 2 (two) times daily.   vitamin B-12 1000 MCG tablet Commonly known as:  CYANOCOBALAMIN Take 1,000 mcg by mouth daily.   warfarin 7.5 MG tablet Commonly known as:  COUMADIN Take 1 tablet (7.5 mg total) by mouth daily at 6 PM.       Allergies  Allergen Reactions  . Latex Other (See Comments)    Per MAR  . Macrobid [employer] Macro] Other (See Comments)    Per MAR  . Morphine And Related Other (See Comments)    Per MAR    Consultations:  none   Procedures/Studies: Ct Angio Chest Pe W And/or Wo Contrast  Result Date: 06/30/2016 CLINICAL DATA:  70 year old presenting from a nursing home with lower extremity edema and shortness of breath. By report, ultrasound  performed at the facility demonstrates DVT. EXAM: CT ANGIOGRAPHY CHEST WITH CONTRAST TECHNIQUE: Multidetector CT imaging of the chest was performed using the standard protocol during bolus administration of intravenous contrast. Multiplanar CT image reconstructions and MIPs were obtained to evaluate the vascular anatomy. CONTRAST:  100 mL Isovue 370 IV. COMPARISON:  None. FINDINGS: Technical quality: Adequate. Respiratory motion blurs images of the lung bases. As the patient was unable to  raise the arms, beam hardening streak artifact is present. Cardiovascular: No filling defects within either main pulmonary artery or their branches in either lung to suggest pulmonary embolism. Heart size upper normal to slightly enlarged. No pericardial effusion. Mild LAD coronary atherosclerosis. Compression of the right atrium by the elevated right hemidiaphragm. Thoracic and upper abdominal aorta tortuous and mildly atherosclerotic without evidence of aneurysm or dissection. Proximal great vessels widely patent with mild atherosclerosis. Mediastinum/Nodes: No pathologically enlarged mediastinal, hilar or axillary lymph nodes. No mediastinal masses. Normal-appearing esophagus. Thyroid gland normal in size containing a 7 mm nodule in the lower pole of the right lobe. Lungs/Pleura: Scar/atelectasis involving the right lower lobe due to the elevated right hemidiaphragm. Mild dependent atelectasis posteriorly in both lower lobes, as expected. Lungs otherwise clear. No pulmonary parenchymal nodules or masses. No pleural effusions. Central airways patent without significant bronchial wall thickening. Upper Abdomen: Large stool burden throughout the visualized colon. Interposition of the hepatic flexure the colon between the liver and the right hemidiaphragm. Likely benign complex cysts arising from the upper pole and mid right kidney. Musculoskeletal: Osseous demineralization. 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  Exaggeration of the usual thoracic kyphosis. Review of the MIP images confirms the above findings. IMPRESSION: 1. No evidence of pulmonary embolism. 2. Borderline heart size. Mild LAD coronary atherosclerosis. Mild thoracic and upper abdominal aortic atherosclerosis. 3. Elevation of the right hemidiaphragm with scar/atelectasis involving the right lower lobe. No acute cardiopulmonary disease otherwise. 4. Likely benign complex cysts involving the upper pole and mid right kidney. 5. Large stool burden throughout the visualized colon. 6. 7 mm right lobe thyroid nodule which does not require further imaging follow-up. Electronically Signed   By: Hulan Saas M.D.   On: 06/30/2016 18:33       Subjective: He is alert, answer questions, known slurred speech at times..   Discharge Exam: Vitals:   07/02/16 2024 07/03/16 0614  BP: (!) 142/62 (!) 145/64  Pulse: 61 60  Resp: 18 12  Temp: 97.7 F (36.5 C) 97.5 F (36.4 C)   Vitals:   07/02/16 0551 07/02/16 1350 07/02/16 2024 07/03/16 0614  BP: (!) 146/77 134/63 (!) 142/62 (!) 145/64  Pulse: (!) 59 62 61 60  Resp: 18 18 18 12   Temp: 97.9 F (36.6 C) 97.4 F (36.3 C) 97.7 F (36.5 C) 97.5 F (36.4 C)  TempSrc: Oral Oral Oral Oral  SpO2: 100% 100% 99% 100%  Weight: 79.9 kg (176 lb 2.4 oz)   81.1 kg (178 lb 14.4 oz)  Height:        General: Pt is alert, awake, not in acute distress Cardiovascular: RRR, S1/S2 +, no rubs, no gallops Respiratory: CTA bilaterally, no wheezing, no rhonchi Abdominal: Soft, NT, ND, bowel sounds + Extremities: right LE edema    The results of significant diagnostics from this hospitalization (including imaging, microbiology, ancillary and laboratory) are listed below for reference.     Microbiology: Recent Results (from the past 240 hour(s))  MRSA PCR Screening     Status: None   Collection Time: 06/30/16 10:26 PM  Result Value Ref Range Status   MRSA by PCR NEGATIVE NEGATIVE Final    Comment:         The GeneXpert MRSA Assay (FDA approved for NASAL specimens only), is one component of a comprehensive MRSA colonization surveillance program. It is not intended to diagnose MRSA infection nor to guide or monitor treatment for MRSA infections.      Labs: BNP (  last 3 results)  Recent Labs  06/30/16 1616  BNP 57.3   Basic Metabolic Panel:  Recent Labs Lab 06/30/16 1616 07/01/16 0338  NA 136 134*  K 4.3 3.6  CL 100* 99*  CO2 28 27  GLUCOSE 122* 116*  BUN 19 16  CREATININE 0.87 0.86  CALCIUM 9.2 8.9   Liver Function Tests:  Recent Labs Lab 06/30/16 1616 07/01/16 0338  AST 18 16  ALT 13* 12*  ALKPHOS 142* 134*  BILITOT 0.6 0.7  PROT 6.6 6.2*  ALBUMIN 3.5 3.3*   No results for input(s): LIPASE, AMYLASE in the last 168 hours. No results for input(s): AMMONIA in the last 168 hours. CBC:  Recent Labs Lab 06/30/16 1616 07/01/16 0338 07/02/16 0508 07/03/16 0243  WBC 7.6 6.7 5.9 6.7  NEUTROABS 4.9  --   --   --   HGB 12.0* 11.1* 11.1* 11.3*  HCT 37.1* 34.4* 33.6* 34.5*  MCV 87.7 87.3 86.4 87.8  PLT 253 237 229 232   Cardiac Enzymes:  Recent Labs Lab 06/30/16 1616  TROPONINI <0.03   BNP: Invalid input(s): POCBNP CBG:  Recent Labs Lab 07/02/16 1055 07/02/16 1613 07/02/16 2026 07/03/16 0613 07/03/16 1114  GLUCAP 151* 130* 145* 102* 156*   D-Dimer No results for input(s): DDIMER in the last 72 hours. Hgb A1c  Recent Labs  07/01/16 0017 07/01/16 0338  HGBA1C 5.4 5.4   Lipid Profile  Recent Labs  07/01/16 0338  CHOL 127  HDL 37*  LDLCALC 78  TRIG 59  CHOLHDL 3.4   Thyroid function studies No results for input(s): TSH, T4TOTAL, T3FREE, THYROIDAB in the last 72 hours.  Invalid input(s): FREET3 Anemia work up  Recent Labs  07/01/16 0338  VITAMINB12 700  FERRITIN 17*  TIBC 291  IRON 44*   Urinalysis No results found for: COLORURINE, APPEARANCEUR, LABSPEC, PHURINE, GLUCOSEU, HGBUR, BILIRUBINUR, KETONESUR, PROTEINUR,  UROBILINOGEN, NITRITE, LEUKOCYTESUR Sepsis Labs Invalid input(s): PROCALCITONIN,  WBC,  LACTICIDVEN Microbiology Recent Results (from the past 240 hour(s))  MRSA PCR Screening     Status: None   Collection Time: 06/30/16 10:26 PM  Result Value Ref Range Status   MRSA by PCR NEGATIVE NEGATIVE Final    Comment:        The GeneXpert MRSA Assay (FDA approved for NASAL specimens only), is one component of a comprehensive MRSA colonization surveillance program. It is not intended to diagnose MRSA infection nor to guide or monitor treatment for MRSA infections.      Time coordinating discharge: Over 30 minutes  SIGNED:   Alba Coryegalado, Keath Matera A, MD  [facility] 07/03/2016, 12:08 PM Pager [phone]  If 7PM-7AM, please contact night-coverage [email or web address] Password [email or web address]

## 2016-07-03 NOTE — Progress Notes (Signed)
Report called to [email or web address].

## 2016-07-03 NOTE — Progress Notes (Signed)
ANTICOAGULATION CONSULT NOTE - Follow Up Consult  Pharmacy Consult for Heparin to Lovenox / Coumadin Indication: DVT  Allergies  Allergen Reactions  . Latex Other (See Comments)    Per MAR  . Macrobid [employer][Nitrofurantoin Monohyd Macro] Other (See Comments)    Per MAR  . Morphine And Related Other (See Comments)    [facility]    Patient Measurements: Height: 6\' 4"  (193 cm) Weight: 178 lb 14.4 oz (81.1 kg) IBW/kg (Calculated) : 86.8  Vital Signs: Temp: 97.5 F (36.4 C) (10/01 0614) Temp Source: Oral (10/01 0614) BP: 145/64 (10/01 0614) Pulse Rate: 60 (10/01 0614)  Labs:  Recent Labs  06/30/16 1616  07/01/16 0338 07/01/16 0955 07/01/16 1002 07/02/16 0508 07/02/16 1406 07/03/16 0243  HGB 12.0*  --  11.1*  --   --  11.1*  --  11.3*  HCT 37.1*  --  34.4*  --   --  33.6*  --  34.5*  PLT 253  --  237  --   --  229  --  232  APTT 29  < >  --   --  104* 120* 70*  --   LABPROT 15.3*  --   --   --   --  14.7  --  17.4*  INR 1.20  --   --   --   --  1.14  --  1.42  HEPARINUNFRC 1.28*  < >  --  0.89*  --   --  0.50 0.58  CREATININE 0.87  --  0.86  --   --   --   --   --   TROPONINI <0.03  --   --   --   --   --   --   --   < > = values in this interval not displayed.  Estimated Creatinine Clearance: 94.3 mL/min (by C-G formula based on SCr of 0.86 mg/dL).  Assessment: Pt was initially placed on heparin due to concern for PE, CT Angio negative for PE, pt on Xarelto PTA for DVT, U/S at outside facility shows continued DVT, now transitioning to Lovenox and Coumadin  Day 3 of 5 of overlap, INR = 1.42, CBC stable  Goal of Therapy:  Monitor platelets by anticoagulation protocol: Yes  INR = 2 to 3   Plan:  DC heparin Lovenox 120 mg sq Q 24 hours Coumadin 7.5 mg po daily Follow up INR Tuesday or Wednesday  Thank you  Okey RegalLisa Grazia Taffe, PharmD [phone] 07/03/2016,11:25 AM

## 2016-07-03 NOTE — Progress Notes (Addendum)
CSW received call from patients, Gilbert Lewis, after patient DC stating she was not informed by doctor or CSW that patient would be leaving today. Patients daughter stated they were informed expecting discharge to be Monday 10/2 evening and were [facility]. CSW spoke with patients daughter at 1:30pm to informed her of [facility]. Patients daughter requested 'patients experience' number to make compliant.   Stacy GardnerErin Kishia Shackett, LCSWA Clinical Social Worker [phone]

## 2016-07-04 ENCOUNTER — Encounter: Payer: Self-pay | Admitting: Internal Medicine

## 2016-07-04 ENCOUNTER — Non-Acute Institutional Stay (SKILLED_NURSING_FACILITY): Payer: Medicare Other | Admitting: Internal Medicine

## 2016-07-04 DIAGNOSIS — G894 Chronic pain syndrome: Secondary | ICD-10-CM | POA: Diagnosis not present

## 2016-07-04 DIAGNOSIS — I82401 Acute embolism and thrombosis of unspecified deep veins of right lower extremity: Secondary | ICD-10-CM

## 2016-07-04 DIAGNOSIS — I119 Hypertensive heart disease without heart failure: Secondary | ICD-10-CM | POA: Diagnosis not present

## 2016-07-04 DIAGNOSIS — R561 Post traumatic seizures: Secondary | ICD-10-CM

## 2016-07-04 DIAGNOSIS — G8111 Spastic hemiplegia affecting right dominant side: Secondary | ICD-10-CM

## 2016-07-04 DIAGNOSIS — I69391 Dysphagia following cerebral infarction: Secondary | ICD-10-CM

## 2016-07-04 DIAGNOSIS — L97519 Non-pressure chronic ulcer of other part of right foot with unspecified severity: Secondary | ICD-10-CM

## 2016-07-04 DIAGNOSIS — F329 Major depressive disorder, single episode, unspecified: Secondary | ICD-10-CM

## 2016-07-04 DIAGNOSIS — R531 Weakness: Secondary | ICD-10-CM

## 2016-07-04 DIAGNOSIS — K257 Chronic gastric ulcer without hemorrhage or perforation: Secondary | ICD-10-CM

## 2016-07-04 DIAGNOSIS — N183 Chronic kidney disease, stage 3 unspecified: Secondary | ICD-10-CM

## 2016-07-04 DIAGNOSIS — E1122 Type 2 diabetes mellitus with diabetic chronic kidney disease: Secondary | ICD-10-CM

## 2016-07-04 DIAGNOSIS — F32A Depression, unspecified: Secondary | ICD-10-CM

## 2016-07-04 DIAGNOSIS — D500 Iron deficiency anemia secondary to blood loss (chronic): Secondary | ICD-10-CM

## 2016-07-04 LAB — FOLATE RBC
FOLATE, HEMOLYSATE: 443.1 ng/mL
Folate, RBC: 1363 ng/mL (ref >498)
Hematocrit: 32.5 % — ABNORMAL LOW (ref 37.5–51.0)

## 2016-07-04 NOTE — Progress Notes (Signed)
[facility]  PCP: No PCP Per Patient   Code Status: Full Code  Goals of care: Advanced Directive information Advanced Directives 06/30/2016  Does patient have an advance directive? No  Would patient like information on creating an advanced directive? No - patient declined information       Extended Emergency Contact Information Primary Emergency Contact: Donatelli,Shirley Address: [address]          [place], [place] [address] [place] Home Phone: [phone] Mobile Phone: [phone] Relation: Other Secondary Emergency Contact: Everitt AmberBennett,Athena Address: [address]          [place], [place] [address] [place] Home Phone: [phone] Mobile Phone: [phone] Relation: Daughter   Allergies  Allergen Reactions  . Latex Other (See Comments)    Per MAR  . Macrobid [employer][Nitrofurantoin Monohyd Macro] Other (See Comments)    Per MAR  . Morphine And Related Other (See Comments)    Per [facility]    Chief Complaint  Patient presents with  . Readmit To SNF    Readmission Visit     HPI:  Patient is a 70 y.o. male seen today for short term rehabilitation post hospital readmission from 06/30/16-07/03/16 with acute DVT. Pulmonary embolism was ruled out. He was on xarelto at time of admission to the hospital. He has medical history of CVA with right sided weakness, HTN, DM among others. He was at this facility for long term care from another SNF/ [facility] in [place]. He does not participate in HPI and ROS today.    Review of Systems: Unable to obtain    Past Medical History:  Diagnosis Date  . Cataract   . CKD (chronic kidney disease)   . Depression   . Diabetes mellitus without complication (HCC)   . Diabetic retinopathy (HCC)   . Hypertension   . Stroke [facility](HCC)    No past surgical history on file. Social History:   reports that he has never smoked. He has never used smokeless tobacco. He reports that he does  not drink alcohol. His drug history is not on file.  Family History  Problem Relation Age of Onset  . Family history unknown: Yes    Medications:   Medication List       Accurate as of 07/04/16  2:14 PM. Always use your most recent med list.          acetaminophen 325 MG tablet Commonly known as:  TYLENOL Take 650 mg by mouth every 4 (four) hours as needed for mild pain.   baclofen 10 MG tablet Commonly known as:  LIORESAL Take 10 mg by mouth 3 (three) times daily.   DECUBI-VITE Caps Take 1 capsule by mouth daily.   enoxaparin 120 MG/0.8ML injection Commonly known as:  LOVENOX Inject 0.8 mLs (120 mg total) into the skin daily.   feeding supplement (PRO-STAT SUGAR FREE 64) Liqd Take 30 mLs by mouth 2 (two) times daily.   ferrous sulfate 325 (65 FE) MG tablet Take 325 mg by mouth daily with breakfast.   finasteride 5 MG tablet Commonly known as:  PROSCAR Take 5 mg by mouth every evening.   levETIRAcetam 250 MG tablet Commonly known as:  KEPPRA Take 250 mg by mouth 2 (two) times daily.   LORazepam 0.5 MG tablet Commonly known as:  ATIVAN Take 0.5 tablets (0.25 mg total) by mouth at bedtime.   losartan 50 MG tablet Commonly known as:  COZAAR  Take 50 mg by mouth daily.   Melatonin 10 MG Tabs Take 10 mg by mouth at bedtime.   metFORMIN 1000 MG tablet Commonly known as:  GLUCOPHAGE Take 1,000 mg by mouth 2 (two) times daily with a meal.   metoprolol tartrate 25 MG tablet Commonly known as:  LOPRESSOR Take 12.5 mg by mouth 2 (two) times daily.   omeprazole 20 MG capsule Commonly known as:  PRILOSEC Take 20 mg by mouth daily.   ondansetron 8 MG tablet Commonly known as:  ZOFRAN Take 8 mg by mouth every 8 (eight) hours as needed for nausea or vomiting.   Oxycodone HCl 10 MG Tabs Take 1 tablet (10 mg total) by mouth every 6 (six) hours as needed (pain).   pioglitazone 15 MG tablet Commonly known as:  ACTOS Take 7.5 mg by mouth daily.   polyethylene  glycol packet Commonly known as:  MIRALAX / GLYCOLAX Take 17 g by mouth daily.   senna 8.6 MG tablet Commonly known as:  SENOKOT Take 2 tablets by mouth 2 (two) times daily.   sertraline 100 MG tablet Commonly known as:  ZOLOFT Take 150 mg by mouth daily.   sodium chloride 1 g tablet Take 2 g by mouth 2 (two) times daily with a meal.   solifenacin 5 MG tablet Commonly known as:  VESICARE Take 5 mg by mouth daily.   sucralfate 1 g tablet Commonly known as:  CARAFATE Take 1 g by mouth 4 (four) times daily -  with meals and at bedtime.   tamsulosin 0.4 MG Caps capsule Commonly known as:  FLOMAX Take 0.4 mg by mouth daily after supper.   ursodiol 500 MG tablet Commonly known as:  ACTIGALL Take 500 mg by mouth 2 (two) times daily.   vitamin B-12 1000 MCG tablet Commonly known as:  CYANOCOBALAMIN Take 1,000 mcg by mouth daily.   warfarin 7.5 MG tablet Commonly known as:  COUMADIN Take 1 tablet (7.5 mg total) by mouth daily at 6 PM.       Immunizations: Immunization History  Administered Date(s) Administered  . Influenza-Unspecified 07/15/2015  . Pneumococcal-Unspecified 09/02/2013     Physical Exam:  Vitals:   07/04/16 1406  BP: 138/82  Pulse: 80  Resp: 20  Temp: 98.2 F (36.8 C)  TempSrc: Oral  SpO2: 96%  Weight: 178 lb 14.4 oz (81.1 kg)  Height: 6\' 4"  (1.93 m)   Body mass index is 21.78 kg/m.   General- elderly male, frail, in no acute distress Head- normocephalic, atraumatic Nose- no nasal discharge Throat- moist mucus membrane Eyes- PERRLA, EOMI, no pallor, no icterus Neck- no cervical lymphadenopathy Cardiovascular- normal s1,s2, + systolic murmur, no leg edema Respiratory- bilateral clear to auscultation, no wheeze, no rhonchi, no crackles, no use of accessory muscles Abdomen- bowel sounds present, soft, non tender Musculoskeletal- right sided hemiplegia present, on wheelchair, right sided spasticity present Neurological- alert and  oriented to self only, follows simple command Skin- warm and dry, dressing to his right foot Psychiatry- normal mood and affect      Labs reviewed: Basic Metabolic Panel:  Recent Labs  16/10/96 1616 07/01/16 0338  NA 136 134*  K 4.3 3.6  CL 100* 99*  CO2 28 27  GLUCOSE 122* 116*  BUN 19 16  CREATININE 0.87 0.86  CALCIUM 9.2 8.9   Liver Function Tests:  Recent Labs  06/30/16 1616 07/01/16 0338  AST 18 16  ALT 13* 12*  ALKPHOS 142* 134*  BILITOT 0.6 0.7  PROT 6.6 6.2*  ALBUMIN 3.5 3.3*   No results for input(s): LIPASE, AMYLASE in the last 8760 hours. No results for input(s): AMMONIA in the last 8760 hours. CBC:  Recent Labs  06/30/16 1616 07/01/16 0338 07/02/16 0508 07/03/16 0243  WBC 7.6 6.7 5.9 6.7  NEUTROABS 4.9  --   --   --   HGB 12.0* 11.1* 11.1* 11.3*  HCT 37.1* 34.4* 33.6* 34.5*  MCV 87.7 87.3 86.4 87.8  PLT 253 237 229 232   Cardiac Enzymes:  Recent Labs  06/30/16 1616  TROPONINI <0.03   BNP: Invalid input(s): POCBNP CBG:  Recent Labs  07/02/16 2026 07/03/16 0613 07/03/16 1114  GLUCAP 145* 102* 156*    Radiological Exams: Ct Angio Chest Pe W And/or Wo Contrast  Result Date: 06/30/2016 CLINICAL DATA:  70 year old presenting from a nursing home with lower extremity edema and shortness of breath. By report, ultrasound performed at the facility demonstrates DVT. EXAM: CT ANGIOGRAPHY CHEST WITH CONTRAST TECHNIQUE: Multidetector CT imaging of the chest was performed using the standard protocol during bolus administration of intravenous contrast. Multiplanar CT image reconstructions and MIPs were obtained to evaluate the vascular anatomy. CONTRAST:  100 mL Isovue 370 IV. COMPARISON:  None. FINDINGS: Technical quality: Adequate. Respiratory motion blurs images of the lung bases. As the patient was unable to raise the arms, beam hardening streak artifact is present. Cardiovascular: No filling defects within either main pulmonary artery or  their branches in either lung to suggest pulmonary embolism. Heart size upper normal to slightly enlarged. No pericardial effusion. Mild LAD coronary atherosclerosis. Compression of the right atrium by the elevated right hemidiaphragm. Thoracic and upper abdominal aorta tortuous and mildly atherosclerotic without evidence of aneurysm or dissection. Proximal great vessels widely patent with mild atherosclerosis. Mediastinum/Nodes: No pathologically enlarged mediastinal, hilar or axillary lymph nodes. No mediastinal masses. Normal-appearing esophagus. Thyroid gland normal in size containing a 7 mm nodule in the lower pole of the right lobe. Lungs/Pleura: Scar/atelectasis involving the right lower lobe due to the elevated right hemidiaphragm. Mild dependent atelectasis posteriorly in both lower lobes, as expected. Lungs otherwise clear. No pulmonary parenchymal nodules or masses. No pleural effusions. Central airways patent without significant bronchial wall thickening. Upper Abdomen: Large stool burden throughout the visualized colon. Interposition of the hepatic flexure the colon between the liver and the right hemidiaphragm. Likely benign complex cysts arising from the upper pole and mid right kidney. Musculoskeletal: Osseous demineralization. 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 Exaggeration of the usual thoracic kyphosis. Review of the MIP images confirms the above findings. IMPRESSION: 1. No evidence of pulmonary embolism. 2. Borderline heart size. Mild LAD coronary atherosclerosis. Mild thoracic and upper abdominal aortic atherosclerosis. 3. Elevation of the right hemidiaphragm with scar/atelectasis involving the right lower lobe. No acute cardiopulmonary disease otherwise. 4. Likely benign complex cysts involving the upper pole and mid right kidney. 5. Large stool burden throughout the visualized colon. 6. 7 mm right lobe thyroid nodule which does not require further imaging follow-up. Electronically  Signed   By: Hulan Saas M.D.   On: 06/30/2016 18:33    Assessment/Plan   Generalized weakness With recent hospital admission for DVT. Will have patient work with PT/OT as tolerated to regain strength and restore function.  Fall precautions are in place.  Acute DVT Continue coumadin for new DVT to right posterior tibial vein with lovenox until inr is therapeutic and then continue coumadin.   Dm type 2 with ckd 3 a1c 5.4 on  review. Monitor cbg. disontinue actos for now. Continue metformin 1000 mg bid  Hypertensive heart disease without heart failure Continue metoprolol tartrate 12.5 mg bid and losartan and monitor BP and check bmp  Dysphagia SLP to evaluate, aspiration precautions  Right spastic hemiplegia Continue baclofen 10 mg tid for his spasticity and tylenol as needed for pain  Iron def anemia Continue ferrous sulfate and monitor cbc   Chronic depression Continue sertraline and lorazepam   gastric ulcer continue omeprazole 20 mg daily and carafate.   ckd stage 3 Monitor bmp  Chronic pain syndrome Continue oxyIR 10 mg q6h prn pain  Right foot ulcer Continue wound care and decubivite  Post traumatic seizure Remains seizure free. Continue keppra 250 mg bid     Goals of care: short term rehabilitation   Labs/tests ordered: cbc, cmp 07/11/16   Family/ staff Communication: reviewed care plan with patient and nursing supervisor    Oneal Grout, MD Internal Medicine [facility] [address] [place], [place] [address] Cell Phone (Monday-Friday 8 am - 5 pm): [phone] On Call: [phone] and follow prompts after 5 pm and on weekends Office Phone: [phone] Office Fax: [phone]

## 2016-07-05 ENCOUNTER — Other Ambulatory Visit: Payer: Self-pay | Admitting: *Deleted

## 2016-07-05 MED ORDER — OXYCODONE HCL 10 MG PO TABS: ORAL_TABLET | ORAL | 0 refills | Status: DC

## 2016-07-05 NOTE — Telephone Encounter (Signed)
[facility] [phone] Fax: [phone]  

## 2016-07-07 ENCOUNTER — Observation Stay (HOSPITAL_COMMUNITY): Payer: Medicare Other

## 2016-07-07 ENCOUNTER — Emergency Department (HOSPITAL_COMMUNITY): Payer: Medicare Other

## 2016-07-07 ENCOUNTER — Observation Stay (HOSPITAL_COMMUNITY)
Admission: EM | Admit: 2016-07-07 | Discharge: 2016-07-10 | Disposition: A | Discharge status: Skilled Nursing Facility | Payer: Medicare Other | Attending: Family Medicine | Admitting: Family Medicine

## 2016-07-07 ENCOUNTER — Encounter (HOSPITAL_COMMUNITY): Payer: Self-pay | Admitting: *Deleted

## 2016-07-07 DIAGNOSIS — G8191 Hemiplegia, unspecified affecting right dominant side: Secondary | ICD-10-CM | POA: Diagnosis not present

## 2016-07-07 DIAGNOSIS — Z7984 Long term (current) use of oral hypoglycemic drugs: Secondary | ICD-10-CM | POA: Insufficient documentation

## 2016-07-07 DIAGNOSIS — D631 Anemia in chronic kidney disease: Secondary | ICD-10-CM | POA: Diagnosis not present

## 2016-07-07 DIAGNOSIS — Z91040 Latex allergy status: Secondary | ICD-10-CM | POA: Insufficient documentation

## 2016-07-07 DIAGNOSIS — R079 Chest pain, unspecified: Secondary | ICD-10-CM | POA: Diagnosis not present

## 2016-07-07 DIAGNOSIS — I69351 Hemiplegia and hemiparesis following cerebral infarction affecting right dominant side: Secondary | ICD-10-CM | POA: Diagnosis not present

## 2016-07-07 DIAGNOSIS — E1122 Type 2 diabetes mellitus with diabetic chronic kidney disease: Secondary | ICD-10-CM | POA: Insufficient documentation

## 2016-07-07 DIAGNOSIS — N401 Enlarged prostate with lower urinary tract symptoms: Secondary | ICD-10-CM | POA: Diagnosis not present

## 2016-07-07 DIAGNOSIS — Z7982 Long term (current) use of aspirin: Secondary | ICD-10-CM | POA: Insufficient documentation

## 2016-07-07 DIAGNOSIS — N138 Other obstructive and reflux uropathy: Secondary | ICD-10-CM

## 2016-07-07 DIAGNOSIS — G9389 Other specified disorders of brain: Secondary | ICD-10-CM | POA: Insufficient documentation

## 2016-07-07 DIAGNOSIS — F419 Anxiety disorder, unspecified: Secondary | ICD-10-CM | POA: Diagnosis not present

## 2016-07-07 DIAGNOSIS — N400 Enlarged prostate without lower urinary tract symptoms: Secondary | ICD-10-CM | POA: Insufficient documentation

## 2016-07-07 DIAGNOSIS — E871 Hypo-osmolality and hyponatremia: Secondary | ICD-10-CM | POA: Insufficient documentation

## 2016-07-07 DIAGNOSIS — G894 Chronic pain syndrome: Secondary | ICD-10-CM | POA: Diagnosis not present

## 2016-07-07 DIAGNOSIS — R0602 Shortness of breath: Secondary | ICD-10-CM

## 2016-07-07 DIAGNOSIS — G4700 Insomnia, unspecified: Secondary | ICD-10-CM | POA: Insufficient documentation

## 2016-07-07 DIAGNOSIS — I639 Cerebral infarction, unspecified: Secondary | ICD-10-CM

## 2016-07-07 DIAGNOSIS — Z885 Allergy status to narcotic agent status: Secondary | ICD-10-CM | POA: Insufficient documentation

## 2016-07-07 DIAGNOSIS — E041 Nontoxic single thyroid nodule: Secondary | ICD-10-CM | POA: Insufficient documentation

## 2016-07-07 DIAGNOSIS — N183 Chronic kidney disease, stage 3 (moderate): Secondary | ICD-10-CM | POA: Insufficient documentation

## 2016-07-07 DIAGNOSIS — M40204 Unspecified kyphosis, thoracic region: Secondary | ICD-10-CM | POA: Diagnosis not present

## 2016-07-07 DIAGNOSIS — I82599 Chronic embolism and thrombosis of other specified deep vein of unspecified lower extremity: Secondary | ICD-10-CM

## 2016-07-07 DIAGNOSIS — E785 Hyperlipidemia, unspecified: Secondary | ICD-10-CM | POA: Diagnosis not present

## 2016-07-07 DIAGNOSIS — R1313 Dysphagia, pharyngeal phase: Secondary | ICD-10-CM | POA: Insufficient documentation

## 2016-07-07 DIAGNOSIS — K219 Gastro-esophageal reflux disease without esophagitis: Secondary | ICD-10-CM | POA: Insufficient documentation

## 2016-07-07 DIAGNOSIS — Z881 Allergy status to other antibiotic agents status: Secondary | ICD-10-CM | POA: Insufficient documentation

## 2016-07-07 DIAGNOSIS — F329 Major depressive disorder, single episode, unspecified: Secondary | ICD-10-CM | POA: Insufficient documentation

## 2016-07-07 DIAGNOSIS — D649 Anemia, unspecified: Secondary | ICD-10-CM

## 2016-07-07 DIAGNOSIS — Z8673 Personal history of transient ischemic attack (TIA), and cerebral infarction without residual deficits: Secondary | ICD-10-CM | POA: Diagnosis not present

## 2016-07-07 DIAGNOSIS — G459 Transient cerebral ischemic attack, unspecified: Principal | ICD-10-CM | POA: Diagnosis present

## 2016-07-07 DIAGNOSIS — R569 Unspecified convulsions: Secondary | ICD-10-CM

## 2016-07-07 DIAGNOSIS — R471 Dysarthria and anarthria: Secondary | ICD-10-CM | POA: Insufficient documentation

## 2016-07-07 DIAGNOSIS — I700 Atherosclerosis of aorta: Secondary | ICD-10-CM | POA: Diagnosis not present

## 2016-07-07 DIAGNOSIS — R2990 Unspecified symptoms and signs involving the nervous system: Secondary | ICD-10-CM | POA: Diagnosis not present

## 2016-07-07 DIAGNOSIS — R791 Abnormal coagulation profile: Secondary | ICD-10-CM

## 2016-07-07 DIAGNOSIS — N3281 Overactive bladder: Secondary | ICD-10-CM | POA: Insufficient documentation

## 2016-07-07 DIAGNOSIS — R799 Abnormal finding of blood chemistry, unspecified: Secondary | ICD-10-CM

## 2016-07-07 DIAGNOSIS — G40909 Epilepsy, unspecified, not intractable, without status epilepticus: Secondary | ICD-10-CM | POA: Diagnosis not present

## 2016-07-07 DIAGNOSIS — Z7901 Long term (current) use of anticoagulants: Secondary | ICD-10-CM | POA: Insufficient documentation

## 2016-07-07 DIAGNOSIS — E11319 Type 2 diabetes mellitus with unspecified diabetic retinopathy without macular edema: Secondary | ICD-10-CM | POA: Diagnosis not present

## 2016-07-07 DIAGNOSIS — E118 Type 2 diabetes mellitus with unspecified complications: Secondary | ICD-10-CM

## 2016-07-07 DIAGNOSIS — I1310 Hypertensive heart and chronic kidney disease without heart failure, with stage 1 through stage 4 chronic kidney disease, or unspecified chronic kidney disease: Secondary | ICD-10-CM | POA: Insufficient documentation

## 2016-07-07 DIAGNOSIS — Z86718 Personal history of other venous thrombosis and embolism: Secondary | ICD-10-CM | POA: Insufficient documentation

## 2016-07-07 DIAGNOSIS — I82409 Acute embolism and thrombosis of unspecified deep veins of unspecified lower extremity: Secondary | ICD-10-CM | POA: Diagnosis present

## 2016-07-07 LAB — PROTIME-INR
INR: 1.14
PROTHROMBIN TIME: 14.6 s (ref 11.4–15.2)

## 2016-07-07 LAB — I-STAT CHEM 8, ED
BUN: 26 mg/dL — AB (ref 6–20)
CALCIUM ION: 1.15 mmol/L (ref 1.15–1.40)
Chloride: 101 mmol/L (ref 101–111)
Creatinine, Ser: 0.9 mg/dL (ref 0.61–1.24)
GLUCOSE: 86 mg/dL (ref 65–99)
HCT: 38 % — ABNORMAL LOW (ref 39.0–52.0)
Hemoglobin: 12.9 g/dL — ABNORMAL LOW (ref 13.0–17.0)
Potassium: 4.2 mmol/L (ref 3.5–5.1)
SODIUM: 137 mmol/L (ref 135–145)
TCO2: 24 mmol/L (ref 0–100)

## 2016-07-07 LAB — URINALYSIS, ROUTINE W REFLEX MICROSCOPIC
BILIRUBIN URINE: NEGATIVE
Glucose, UA: NEGATIVE mg/dL
Hgb urine dipstick: NEGATIVE
Ketones, ur: NEGATIVE mg/dL
LEUKOCYTES UA: NEGATIVE
NITRITE: NEGATIVE
PH: 7 (ref 5.0–8.0)
Protein, ur: NEGATIVE mg/dL
SPECIFIC GRAVITY, URINE: 1.015 (ref 1.005–1.030)

## 2016-07-07 LAB — COMPREHENSIVE METABOLIC PANEL
ALK PHOS: 140 U/L — AB (ref 38–126)
ALT: 17 U/L (ref 17–63)
ANION GAP: 9 (ref 5–15)
AST: 18 U/L (ref 15–41)
Albumin: 3.8 g/dL (ref 3.5–5.0)
BILIRUBIN TOTAL: 0.6 mg/dL (ref 0.3–1.2)
BUN: 23 mg/dL — ABNORMAL HIGH (ref 6–20)
CALCIUM: 9.4 mg/dL (ref 8.9–10.3)
CO2: 25 mmol/L (ref 22–32)
CREATININE: 0.97 mg/dL (ref 0.61–1.24)
Chloride: 100 mmol/L — ABNORMAL LOW (ref 101–111)
Glucose, Bld: 92 mg/dL (ref 65–99)
Potassium: 4.2 mmol/L (ref 3.5–5.1)
SODIUM: 134 mmol/L — AB (ref 135–145)
TOTAL PROTEIN: 6.8 g/dL (ref 6.5–8.1)

## 2016-07-07 LAB — CBG MONITORING, ED: Glucose-Capillary: 92 mg/dL (ref 65–99)

## 2016-07-07 LAB — DIFFERENTIAL
Basophils Absolute: 0 10*3/uL (ref 0.0–0.1)
Basophils Relative: 0 %
EOS PCT: 2 %
Eosinophils Absolute: 0.2 10*3/uL (ref 0.0–0.7)
LYMPHS ABS: 3.1 10*3/uL (ref 0.7–4.0)
LYMPHS PCT: 33 %
MONO ABS: 1 10*3/uL (ref 0.1–1.0)
MONOS PCT: 11 %
Neutro Abs: 5.1 10*3/uL (ref 1.7–7.7)
Neutrophils Relative %: 54 %

## 2016-07-07 LAB — ETHANOL: Alcohol, Ethyl (B): <5 mg/dL (ref <5)

## 2016-07-07 LAB — I-STAT TROPONIN, ED: TROPONIN I, POC: 0 ng/mL (ref 0.00–0.08)

## 2016-07-07 LAB — GLUCOSE, CAPILLARY
GLUCOSE-CAPILLARY: 132 mg/dL — AB (ref 65–99)
GLUCOSE-CAPILLARY: 169 mg/dL — AB (ref 65–99)

## 2016-07-07 LAB — CBC
HEMATOCRIT: 38 % — AB (ref 39.0–52.0)
Hemoglobin: 12.4 g/dL — ABNORMAL LOW (ref 13.0–17.0)
MCH: 29 pg (ref 26.0–34.0)
MCHC: 32.6 g/dL (ref 30.0–36.0)
MCV: 89 fL (ref 78.0–100.0)
PLATELETS: 258 10*3/uL (ref 150–400)
RBC: 4.27 MIL/uL (ref 4.22–5.81)
RDW: 13.7 % (ref 11.5–15.5)
WBC: 9.5 10*3/uL (ref 4.0–10.5)

## 2016-07-07 LAB — RAPID URINE DRUG SCREEN, HOSP PERFORMED
Amphetamines: NOT DETECTED
Barbiturates: NOT DETECTED
Benzodiazepines: NOT DETECTED
Cocaine: NOT DETECTED
OPIATES: NOT DETECTED
Tetrahydrocannabinol: NOT DETECTED

## 2016-07-07 LAB — APTT: aPTT: 29 seconds (ref 24–36)

## 2016-07-07 MED ORDER — POLYETHYLENE GLYCOL 3350 17 G PO PACK
17.0000 g | PACK | Freq: Every day | ORAL | Status: DC
  Administered 2016-07-08 – 2016-07-09 (×2): 17 g via ORAL
  Filled 2016-07-07 (×2): qty 1

## 2016-07-07 MED ORDER — BACLOFEN 10 MG PO TABS: 10.0000 mg | ORAL_TABLET | Freq: Once | ORAL | Status: DC

## 2016-07-07 MED ORDER — ENOXAPARIN SODIUM 80 MG/0.8ML ~~LOC~~ SOLN
1.0000 mg/kg | Freq: Once | SUBCUTANEOUS | Status: DC
  Filled 2016-07-07: qty 0.8

## 2016-07-07 MED ORDER — PANTOPRAZOLE SODIUM 40 MG PO TBEC
40.0000 mg | DELAYED_RELEASE_TABLET | Freq: Every day | ORAL | Status: DC
  Administered 2016-07-08 – 2016-07-10 (×3): 40 mg via ORAL
  Filled 2016-07-07 (×3): qty 1

## 2016-07-07 MED ORDER — OXYCODONE HCL 5 MG PO TABS
10.0000 mg | ORAL_TABLET | Freq: Four times a day (QID) | ORAL | Status: DC | PRN
  Administered 2016-07-07 – 2016-07-08 (×3): 10 mg via ORAL
  Filled 2016-07-07 (×3): qty 2

## 2016-07-07 MED ORDER — STROKE: EARLY STAGES OF RECOVERY BOOK: Freq: Once | Status: DC

## 2016-07-07 MED ORDER — ONDANSETRON HCL 4 MG PO TABS: 8.0000 mg | ORAL_TABLET | Freq: Three times a day (TID) | ORAL | Status: DC | PRN

## 2016-07-07 MED ORDER — LORAZEPAM 0.5 MG PO TABS
0.2500 mg | ORAL_TABLET | Freq: Every day | ORAL | Status: DC
  Administered 2016-07-07 – 2016-07-09 (×3): 0.25 mg via ORAL
  Filled 2016-07-07 (×3): qty 1

## 2016-07-07 MED ORDER — FERROUS SULFATE 325 (65 FE) MG PO TABS
325.0000 mg | ORAL_TABLET | Freq: Every day | ORAL | Status: DC
  Administered 2016-07-07 – 2016-07-10 (×4): 325 mg via ORAL
  Filled 2016-07-07 (×5): qty 1

## 2016-07-07 MED ORDER — FENTANYL CITRATE (PF) 100 MCG/2ML IJ SOLN
25.0000 ug | Freq: Once | INTRAMUSCULAR | Status: AC
  Administered 2016-07-07: 25 ug via INTRAVENOUS
  Filled 2016-07-07: qty 2

## 2016-07-07 MED ORDER — ONDANSETRON HCL 4 MG PO TABS: 4.0000 mg | ORAL_TABLET | Freq: Four times a day (QID) | ORAL | Status: DC | PRN

## 2016-07-07 MED ORDER — SODIUM CHLORIDE 0.9 % IV SOLN
500.0000 mg | Freq: Two times a day (BID) | INTRAVENOUS | Status: DC
  Administered 2016-07-07 – 2016-07-10 (×6): 500 mg via INTRAVENOUS
  Filled 2016-07-07 (×7): qty 5

## 2016-07-07 MED ORDER — SUCRALFATE 1 G PO TABS
1.0000 g | ORAL_TABLET | Freq: Three times a day (TID) | ORAL | Status: DC
  Administered 2016-07-07 – 2016-07-10 (×10): 1 g via ORAL
  Filled 2016-07-07 (×11): qty 1

## 2016-07-07 MED ORDER — WARFARIN SODIUM 5 MG PO TABS
10.0000 mg | ORAL_TABLET | Freq: Once | ORAL | Status: AC
Start: 2016-07-07 — End: 2016-07-07
  Administered 2016-07-07: 10 mg via ORAL
  Filled 2016-07-07: qty 2

## 2016-07-07 MED ORDER — SENNA 8.6 MG PO TABS
2.0000 | ORAL_TABLET | Freq: Two times a day (BID) | ORAL | Status: DC
  Administered 2016-07-07 – 2016-07-10 (×7): 17.2 mg via ORAL
  Filled 2016-07-07 (×7): qty 2

## 2016-07-07 MED ORDER — HYDRALAZINE HCL 20 MG/ML IJ SOLN: 5.0000 mg | INTRAMUSCULAR | Status: DC | PRN

## 2016-07-07 MED ORDER — SERTRALINE HCL 50 MG PO TABS
150.0000 mg | ORAL_TABLET | Freq: Every day | ORAL | Status: DC
  Administered 2016-07-07 – 2016-07-10 (×4): 150 mg via ORAL
  Filled 2016-07-07 (×4): qty 1

## 2016-07-07 MED ORDER — ASPIRIN 300 MG RE SUPP: 300.0000 mg | Freq: Every day | RECTAL | Status: DC

## 2016-07-07 MED ORDER — OXYCODONE HCL 5 MG PO TABS: 5.0000 mg | ORAL_TABLET | ORAL | Status: DC | PRN

## 2016-07-07 MED ORDER — INSULIN ASPART 100 UNIT/ML ~~LOC~~ SOLN
0.0000 [IU] | Freq: Three times a day (TID) | SUBCUTANEOUS | Status: DC
  Administered 2016-07-07: 1 [IU] via SUBCUTANEOUS
  Administered 2016-07-08: 2 [IU] via SUBCUTANEOUS

## 2016-07-07 MED ORDER — FINASTERIDE 5 MG PO TABS
5.0000 mg | ORAL_TABLET | Freq: Every evening | ORAL | Status: DC
  Administered 2016-07-07 – 2016-07-09 (×3): 5 mg via ORAL
  Filled 2016-07-07 (×3): qty 1

## 2016-07-07 MED ORDER — IOPAMIDOL (ISOVUE-370) INJECTION 76%
INTRAVENOUS | Status: AC
  Filled 2016-07-07: qty 100

## 2016-07-07 MED ORDER — SODIUM CHLORIDE 0.9 % IV SOLN
1000.0000 mg | Freq: Once | INTRAVENOUS | Status: AC
  Administered 2016-07-07: 1000 mg via INTRAVENOUS
  Filled 2016-07-07: qty 10

## 2016-07-07 MED ORDER — ONDANSETRON HCL 4 MG/2ML IJ SOLN: 4.0000 mg | Freq: Four times a day (QID) | INTRAMUSCULAR | Status: DC | PRN

## 2016-07-07 MED ORDER — TAMSULOSIN HCL 0.4 MG PO CAPS
0.4000 mg | ORAL_CAPSULE | Freq: Every day | ORAL | Status: DC
  Administered 2016-07-07 – 2016-07-09 (×3): 0.4 mg via ORAL
  Filled 2016-07-07 (×3): qty 1

## 2016-07-07 MED ORDER — URSODIOL 300 MG PO CAPS
600.0000 mg | ORAL_CAPSULE | Freq: Two times a day (BID) | ORAL | Status: DC
  Administered 2016-07-07 – 2016-07-10 (×7): 600 mg via ORAL
  Filled 2016-07-07 (×8): qty 2

## 2016-07-07 MED ORDER — ASPIRIN 325 MG PO TABS
325.0000 mg | ORAL_TABLET | Freq: Every day | ORAL | Status: DC
  Administered 2016-07-07 – 2016-07-10 (×4): 325 mg via ORAL
  Filled 2016-07-07 (×4): qty 1

## 2016-07-07 MED ORDER — SODIUM CHLORIDE 0.45 % IV SOLN
INTRAVENOUS | Status: DC
  Administered 2016-07-07 – 2016-07-09 (×4): via INTRAVENOUS

## 2016-07-07 MED ORDER — BACLOFEN 10 MG PO TABS
10.0000 mg | ORAL_TABLET | Freq: Three times a day (TID) | ORAL | Status: DC
  Administered 2016-07-07 – 2016-07-10 (×10): 10 mg via ORAL
  Filled 2016-07-07 (×10): qty 1

## 2016-07-07 MED ORDER — WARFARIN - PHARMACIST DOSING INPATIENT
Freq: Every day | Status: DC
  Administered 2016-07-08 – 2016-07-09 (×2)

## 2016-07-07 MED ORDER — MELATONIN 3 MG PO TABS
3.0000 | ORAL_TABLET | Freq: Every day | ORAL | Status: DC
  Administered 2016-07-07 – 2016-07-09 (×3): 9 mg via ORAL
  Filled 2016-07-07 (×4): qty 3

## 2016-07-07 MED ORDER — IOPAMIDOL (ISOVUE-370) INJECTION 76%
INTRAVENOUS | Status: AC
  Administered 2016-07-07: 50 mL
  Filled 2016-07-07: qty 50

## 2016-07-07 MED ORDER — INSULIN ASPART 100 UNIT/ML ~~LOC~~ SOLN: 0.0000 [IU] | Freq: Every day | SUBCUTANEOUS | Status: DC

## 2016-07-07 MED ORDER — PRO-STAT SUGAR FREE PO LIQD
30.0000 mL | Freq: Two times a day (BID) | ORAL | Status: DC
  Administered 2016-07-07 – 2016-07-09 (×5): 30 mL via ORAL
  Filled 2016-07-07 (×6): qty 30

## 2016-07-07 NOTE — ED Notes (Signed)
Patient yelling out loud stating "you aren't doing sh-- for me".  Bending left leg up and using left arm on the right rail to try to pull himself over.

## 2016-07-07 NOTE — Evaluation (Signed)
Speech Language Pathology Evaluation Patient Details Name: Gilbert Lewis MRN: [id number] DOB: 15-Jul-1946 Today's Date: 07/07/2016 Time: [id number] SLP Time Calculation (min) (ACUTE ONLY): 26 min  Problem List:  Patient Active Problem List   Diagnosis Date Noted  . TIA (transient ischemic attack) 07/07/2016  . Diabetes mellitus with complication (HCC) 07/07/2016  . Dysarthria 07/07/2016  . Seizures (HCC) 07/07/2016  . Stroke-like symptoms   . Pressure injury of skin 07/01/2016  . DVT (deep venous thrombosis) (HCC) 06/30/2016  . Slurred speech   . Benign hypertensive heart disease without heart failure 06/13/2016  . 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 Type 2 diabetes mellitus with stage 3 chronic kidney disease, without long-term current use of insulin (HCC) 06/13/2016  . Major depression, chronic 06/13/2016  . BPH (benign prostatic hyperplasia) 06/13/2016  . OAB (overactive bladder) 06/13/2016  . Anemia, iron deficiency 06/13/2016  . 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 Insomnia 06/13/2016  . Chronic gastric ulcer 06/13/2016  . CKD (chronic kidney disease) stage 3, GFR 30-59 ml/min 06/13/2016  . Chronic constipation 06/13/2016  . Chronic hyponatremia 06/13/2016  . Chronic pain syndrome 06/13/2016  . Calculus of gallbladder and bile duct with obstruction without cholecystitis 06/13/2016  . History of DVT (deep vein thrombosis) 06/13/2016   Past Medical History:  Past Medical History:  Diagnosis Date  . Cataract   . CKD (chronic kidney disease)   . Depression   . Diabetes mellitus without complication (HCC)   . Diabetic retinopathy (HCC)   . Hypertension   . Stroke [facility](HCC)    Past Surgical History: History reviewed. No pertinent surgical history. HPI:  70 y.o.malewith medical history significant of CKD,  depression, CVA with residual right-sided weakness, DM, retinopathy, HTN, presenting with difficulty speaking and left-sided weakness. Head CT showed old moderate LEFT MCA territory infarct and no acute infarct. Pt failed SSS due to h/o dysphagia.   Assessment / Plan / Recommendation Clinical Impression  Pt presents with a moderate cognitive linguistic impairment. He was oriented to person and place. Pt followed 1 step commands during functional taks with moderate verbal and visual cueing. Pt's working memory and sustained attention were intact for basic cognitive tasks, but pt refused to participate in more challenging trials, shaking his head no when asked to try. Given moderate cueing for confrontational naming pt was unable to name objects but he was able to show their functions. Pt had dysarthic speech output with verbal expression characterized by mild episodes of echolalia and inconsistent word finding at the word level. SLP observed mild phonemic paraphasias and pt was emergently aware of communication difficulties. Daughter reports pt has baseline aphasia that worsens when he is tired. She states he is improved from admission time but is still impaired from baseline. Recommend speech therapy to aid in returning to baseline cognitive and speech language performance.    SLP Assessment  Patient needs continued Speech Lanaguage Pathology Services    Follow Up Recommendations  24 hour supervision/assistance;Skilled Nursing facility    Frequency and Duration min 2x/week  2 weeks      SLP Evaluation Cognition  Overall Cognitive Status: Impaired/Different from baseline Arousal/Alertness: Awake/alert Orientation Level: Oriented to person;Oriented to place;Disoriented to time Attention: Sustained Sustained Attention: Impaired Sustained Attention Impairment: Verbal basic Memory: Impaired Memory Impairment: Storage deficit;Retrieval deficit Awareness: Impaired Awareness Impairment:  Anticipatory impairment Safety/Judgment: Impaired  Comprehension  Auditory Comprehension Overall Auditory Comprehension: Impaired Yes/No Questions: Within Functional Limits Commands: Impaired One Step Basic Commands: 25-49% accurate Conversation: Simple Interfering Components: Processing speed;Working Radio broadcast assistant: Media planner: Not tested Reading Comprehension Reading Status: Not tested    Expression Expression Primary Mode of Expression: Verbal Verbal Expression Overall Verbal Expression: Impaired Level of Generative/Spontaneous Verbalization: Word Repetition: Impaired Level of Impairment: Word level Naming: Impairment Confrontation: Impaired Verbal Errors: Echolalia;Aware of errors;Phonemic paraphasias Interfering Components: Speech intelligibility Written Expression Written Expression: Not tested   Oral / Motor  Oral Motor/Sensory Function Overall Oral Motor/Sensory Function: Generalized oral weakness Motor Speech Overall Motor Speech: Impaired Respiration: Within functional limits Phonation: Low vocal intensity Resonance: Within functional limits Articulation: Impaired Level of Impairment: Word Intelligibility: Intelligibility reduced Word: 50-74% accurate Phrase: 25-49% accurate Motor Planning: Impaired Level of Impairment: Word Motor Speech Errors: Aware;Inconsistent   GO          Functional Assessment Tool Used: skilled clinical judgment Functional Limitations: Spoken language expressive Swallow Current Status [phone]): At least 40 percent but less than 60 percent impaired, limited or restricted Swallow Goal Status [phone]): At least 20 percent but less than 40 percent impaired, limited or restricted Spoken Language Expression Current Status [phone]): At least 60 percent but less than 80 percent impaired, limited or restricted Spoken Language Expression Goal  Status [phone]): At least 40 percent but less than 60 percent impaired, limited or restricted         Tollie Eth, Student SLP  Caryl Never 07/07/2016, 11:43 AM

## 2016-07-07 NOTE — Progress Notes (Signed)
STROKE TEAM PROGRESS NOTE   HISTORY OF PRESENT ILLNESS (per record) Gilbert Lewis is an 70 y.o. male left MCA stroke with right hemiplegia, diabetes mellitus, seizure disorder, hypertension, chronic kidney disease and depression, brought to the emergency room and code stroke status following new onset of not moving left side and unable to speak. Patient was recently hospitalized for suspected DVT. He was on Xarelto the time of admission and was switched to Coumadin with Lovenox bridge. CT scan of his head showed no acute findings. Patient began to respond verbally after obtaining CT scan of his head, and was able to follow commands with use of his left upper extremity, although he was confused. Exam showed marked spasticity of his right upper and lower extremities as well as left lower extremity. Muscle tone was slightly increased in left upper extremity. His overall NIH strokes score was 16, with deficits thought to largely be due to previous stroke (S). INR was 1.14 and APTT was 29. He was LKW at 11:00 PM on 07/06/2016. Patient was not administered IV t-PA secondary to being beyond 3 hour time window for treatment. He was admitted for further evaluation and treatment.   SUBJECTIVE (INTERVAL HISTORY) His daughter and wife are at the bedside.  He and wife just moved from [place] 3 weeks ago. He developed LE edema and was round to have a new R leg DVT while on Xarelto. treated with IV hep/Lovenox to coumadin. Patient resident of [facility] in [place]. Last night they found him SOB and decreased movement. He was transported to [facility]. Overall he feels his condition is gradually worsening. He has pre-existing spasticity from stroke 9 years ago. Patient is just back from imaging. Spasticity severe and uncomfortable for pt. Back on home Baclofen. Consider MRI once he is more stable.   OBJECTIVE Temp:  [96.8 F (36 C)-98.6 F (37 C)] 96.8 F (36 C) (10/05 0800) Pulse Rate:  [76-100] 92 (10/05  1000) Cardiac Rhythm: Heart block (10/05 0940) Resp:  [13-25] 16 (10/05 1000) BP: (134-161)/(66-95) 139/85 (10/05 1000) SpO2:  [94 %-100 %] 95 % (10/05 1000) Weight:  [80.6 kg (177 lb 11.1 oz)] 80.6 kg (177 lb 11.1 oz) (10/05 0216)  CBC:  Recent Labs Lab 06/30/16 1616  07/03/16 0243 07/07/16 0156 07/07/16 0157  WBC 7.6  < > 6.7  --  9.5  NEUTROABS 4.9  --   --   --  5.1  HGB 12.0*  < > 11.3* 12.9* 12.4*  HCT 37.1*  < > 34.5* 38.0* 38.0*  MCV 87.7  < > 87.8  --  89.0  PLT 253  < > 232  --  258  < > = values in this interval not displayed.  Basic Metabolic Panel:  Recent Labs Lab 07/01/16 0338 07/07/16 0156 07/07/16 0157  NA 134* 137 134*  K 3.6 4.2 4.2  CL 99* 101 100*  CO2 27  --  25  GLUCOSE 116* 86 92  BUN 16 26* 23*  CREATININE 0.86 0.90 0.97  CALCIUM 8.9  --  9.4    Lipid Panel:    Component Value Date/Time   CHOL 127 07/01/2016 0338   TRIG 59 07/01/2016 0338   HDL 37 (L) 07/01/2016 0338   CHOLHDL 3.4 07/01/2016 0338   VLDL 12 07/01/2016 0338   LDLCALC 78 07/01/2016 0338   HgbA1c:  Lab Results  Component Value Date   HGBA1C 5.4 07/01/2016   Urine Drug Screen:    Component Value Date/Time   LABOPIA  NONE DETECTED 07/07/2016 1020   COCAINSCRNUR NONE DETECTED 07/07/2016 1020   LABBENZ NONE DETECTED 07/07/2016 1020   AMPHETMU NONE DETECTED 07/07/2016 1020   THCU NONE DETECTED 07/07/2016 1020   LABBARB NONE DETECTED 07/07/2016 1020      IMAGING  Ct Head Wo Contrast 07/07/2016 No acute intracranial process. Old moderate LEFT MCA territory infarct. Moderate to severe white matter changes compatible with chronic small vessel ischemic disease.   Ct Angio Head W/cm &/or Wo Cm 07/07/2016 1. No occlusion or high-grade stenosis of the intracranial arteries. 2. Mild atherosclerotic calcification within the proximal V4 segments of the vertebral arteries, with resultant mild-to-moderate stenosis. 3. Left MCA distribution encephalomalacia secondary to remote  infarct.   Dg Chest Port 1 View 07/07/2016 Suboptimal inspiration. No definite active process. Mild cardiomegaly.     PHYSICAL EXAM Frail elderly Caucasian male not in distress. . Afebrile. Head is nontraumatic. Neck is supple without bruit.    Cardiac exam no murmur or gallop. Lungs are clear to auscultation. Distal pulses are well felt. Neurological Exam :  Awake alert oriented 2. Dysarthric speech with nonfluent speech with slight word finding difficulties. Follows commands well. Extraocular movements are full range without nystagmus. Blinks to threat bilaterally. Fundi were not visualized. Right lower facial weakness. Tongue midline. Spastic right hemiplegia with non-fixed flexion contractures the right elbow, wrist right knee and ankle. Pain on passive range of motion from the right. Antigravity movements on the left but mild left-sided weakness. Patient not cooperative for detailed muscle testing. Deep tendon reflexes are brisk on the right and normal on the left. Right plantar upgoing left equivocal. Gait was not tested.  ASSESSMENT/PLAN [name] is a 70 y.o. male with history of right hemiplegia, diabetes mellitus, seizure disorder, hypertension, chronic kidney disease and depression presenting with L sided weakness and speech difficulty. He did not receive IV t-PA due to being geyond 3 hour time window for treatment.   Possible Stroke/TIA vs recrudescence of prior stroke symptoms  CTA head No significant stenosis   MRI  Consider once stable. Unable to lie flat at this time  Carotid Doppler  pending   2D Echo  pending   LDL 78  HgbA1c 5.4  Warfarin and lovenox for VTE prophylaxis  Diet Heart Room service appropriate? Yes; Fluid consistency: Thin  Diet NPO time specified  warfarin daily prior to admission, now on warfarin daily and full dose lovenox  Patient counseled to be compliant with his antithrombotic medications  Ongoing aggressive stroke risk factor  management  Therapy recommendations:  pending   Disposition:  pending   Hypertension  Stable  Long-term BP goal normotensive  Hyperlipidemia  Home meds:  No statin  LDL 78, goal < 70  Add statin, low dose  Continue statin at discharge  Diabetes  HgbA1c 5.4, goal < 7.0  Controlled  Other Stroke Risk Factors  Advanced age  Hx stroke/TIA  9 years ago  Other Active Problems  R leg DVT on coumadin, Pharmacy managing  CKD  Depression  Spasticity secondary to hx stroke. On baclofen at home. Severe today. Unable to lie flat or tolerate MRI  Chest port CXR. Pt warm to touch. Presented with SOB. Will check.  UA normal  On Keppra at home  Hospital day # 0  Rhoderick Moody [facility] See Amion for Pager information 07/07/2016 3:39 PM  I have personally examined this patient, reviewed notes, independently viewed imaging studies, participated in medical decision making and plan of  care.ROS completed by me personally and pertinent positives fully documented  I have made any additions or clarifications directly to the above note. Agree with note above. Patient has a had recurrent DVT despite anticoagulation and presents with new weakness. CT scan shows no acute abnormality but patient is too uncomfortable at the present time to undergo MRI scan. Had a long discussion the bedside with patient's daughter and answered questions. Greater than 50% time during this 35 minute visit was spent on counseling and coordination of care about his stroke, risk, prevention and treatment   Delia HeadyPramod Sethi, MD Medical Director [facility] Pager: [phone] 07/07/2016 4:45 PM  To contact Stroke Continuity provider, please refer to [email or web address]. After hours, contact General Neurology

## 2016-07-07 NOTE — ED Notes (Signed)
Patient arrived from [facility] with left sided weakness and inability to talk.  Upon arrival patient was not moving his left leg and his left arm had a drift.  Was not talking and if he did it was only 1 word.  CT completed but no stroke noted.  Has had a stroke in the past with right left extended out and not moving and right arm stiff.  At the approx 2 hr time patient was talking, bending his left leg up and down but had a drift and moving his left arm and squeezing my hand.  Condom cath applied to obtain urine but no urine at this time.  Fentanyl 25mcg given for left leg pain and spasms. Patient with spasms which causes the EKG to be difficult to read.  Unable to lay flat for MRI due to neck fusion years ago.  Patient did not pass swallow screen - SLP entered

## 2016-07-07 NOTE — ED Notes (Signed)
Dr Roseanne RenoStewart aware that patient is unable to lay down for the MRI, also aware that patient is talking and trying to move himself.  Wife states he is not to have an MRI because he cannot lay down

## 2016-07-07 NOTE — Progress Notes (Signed)
OT Cancellation Note  Patient Details Name: Gilbert OpitzBobby Lewis MRN: [id number] DOB: 11/13/1945   Cancelled Treatment:    Reason Eval/Treat Not Completed: Patient not medically ready (active bedrest orders).  Gaye AlkenBailey A Tashunda Vandezande M.S., OTR/L Pager: [phone]  07/07/2016, 3:22 PM

## 2016-07-07 NOTE — Consult Note (Signed)
Admission H&P    Chief Complaint: Left-sided weakness and speech difficulty.  HPI: Gilbert Lewis is an 70 y.o. male left MCA stroke with right hemiplegia, diabetes mellitus, seizure disorder, hypertension, chronic kidney disease and depression, brought to the emergency room and code stroke status following new onset of not moving left side and unable to speak. Patient was recently hospitalized for suspected DVT. He was on Xarelto the time of admission and was switched to Coumadin with Lovenox bridge. CT scan of his head showed no acute findings. Patient began to respond verbally after obtaining CT scan of his head, and was able to follow commands with use of his left upper extremity, although he was confused. Exam showed marked spasticity of his right upper and lower extremities as well as left lower extremity. Muscle tone was slightly increased in left upper extremity. His overall NIH strokes score was 16, with deficits thought to largely be due to previous stroke (S). INR was 1.14 and APTT was 29.  LSN: 11:00 PM on 07/06/2016 tPA Given: No: Beyond 3 hour time window for treatment. mRankin:  Past Medical History:  Diagnosis Date  . Cataract   . CKD (chronic kidney disease)   . Depression   . Diabetes mellitus without complication (HCC)   . Diabetic retinopathy (HCC)   . Hypertension   . Stroke [facility])     History reviewed. No pertinent surgical history.  Family History  Problem Relation Age of Onset  . Family history unknown: Yes   Social History:  reports that he has never smoked. He has never used smokeless tobacco. He reports that he does not drink alcohol or use drugs.  Allergies:  Allergies  Allergen Reactions  . Latex Other (See Comments)    Per MAR  . Macrobid [employer] Macro] Other (See Comments)    Per MAR  . Morphine And Related Other (See Comments)    Per MAR    Medications: Preadmission medications were reviewed by me.  ROS: Unavailable due to  patient's confused state.  Physical Examination: Blood pressure 157/84, pulse 79, temperature 97.8 F (36.6 C), temperature source Oral, resp. rate 22, height 6\' 4"  (1.93 m), weight 80.6 kg (177 lb 11.1 oz), SpO2 100 %.  HEENT-  Normocephalic, no lesions, without obvious abnormality.  Normal external eye and conjunctiva.  Normal TM's bilaterally.  Normal auditory canals and external ears. Normal external nose, mucus membranes and septum.  Normal pharynx. Neck supple with no masses, nodes, nodules or enlargement. Cardiovascular - regular rate and rhythm, S1, S2 normal, no murmur, click, rub or gallop Lungs - chest clear, no wheezing, rales, normal symmetric air entry Abdomen - soft, non-tender; bowel sounds normal; no masses,  no organomegaly Extremities - marked spasticity with contractures of right upper extremity and both lower extremities in extension.  Neurologic Examination: Mental Status: Alert, confused, no acute distress.  Speech moderately dysarthric with expressive aphasia. Able to follow commands with use of left upper extremity. Cranial Nerves: II-Visual fields were normal. III/IV/VI-Pupils were equal and reacted normally to light. Extraocular movements were full and conjugate.    V/VII-reduced perception of tactile sensation of the right side of the face compared to the left; mild to moderate right lower facial weakness. VIII-normal. X-moderately dysarthric. XI: trapezius strength/neck flexion strength normal bilaterally XII-midline tongue extension with normal strength. Motor: Spastic contractures in extension of right upper extremity and both lower extremities with no voluntary movement; no drift of left upper extremity. Sensory: Reduced perception of tactile sensation over  right extremities compared to left extremities. Deep Tendon Reflexes: Absent throughout and symmetric. Plantars: Mute bilaterally Cerebellar: Normal finger-to-nose testing. Carotid auscultation:  Normal  Results for orders placed or performed during the hospital encounter of 07/07/16 (from the past 48 hour(s))  CBG monitoring, ED     Status: None   Collection Time: 07/07/16  1:50 AM  Result Value Ref Range   Glucose-Capillary 92 65 - 99 mg/dL  I-stat troponin, ED ([facility])     Status: None   Collection Time: 07/07/16  1:54 AM  Result Value Ref Range   Troponin i, poc 0.00 0.00 - 0.08 ng/mL   Comment 3            Comment: Due to the release kinetics of cTnI, a negative result within the first hours of the onset of symptoms does not rule out myocardial infarction with certainty. If myocardial infarction is still suspected, repeat the test at appropriate intervals.   I-Stat Chem 8, ED  (not at [facility], [facility])     Status: Abnormal   Collection Time: 07/07/16  1:56 AM  Result Value Ref Range   Sodium 137 135 - 145 mmol/L   Potassium 4.2 3.5 - 5.1 mmol/L   Chloride 101 101 - 111 mmol/L   BUN 26 (H) 6 - 20 mg/dL   Creatinine, Ser 1.610.90 0.61 - 1.24 mg/dL   Glucose, Bld 86 65 - 99 mg/dL   Calcium, Ion 0.961.15 0.451.15 - 1.40 mmol/L   TCO2 24 0 - 100 mmol/L   Hemoglobin 12.9 (L) 13.0 - 17.0 g/dL   HCT 40.938.0 (L) 81.139.0 - 91.452.0 %  Protime-INR     Status: None   Collection Time: 07/07/16  1:57 AM  Result Value Ref Range   Prothrombin Time 14.6 11.4 - 15.2 seconds   INR 1.14   APTT     Status: None   Collection Time: 07/07/16  1:57 AM  Result Value Ref Range   aPTT 29 24 - 36 seconds  CBC     Status: Abnormal   Collection Time: 07/07/16  1:57 AM  Result Value Ref Range   WBC 9.5 4.0 - 10.5 K/uL   RBC 4.27 4.22 - 5.81 MIL/uL   Hemoglobin 12.4 (L) 13.0 - 17.0 g/dL   HCT 78.238.0 (L) 95.639.0 - 21.352.0 %   MCV 89.0 78.0 - 100.0 fL   MCH 29.0 26.0 - 34.0 pg   MCHC 32.6 30.0 - 36.0 g/dL   RDW 08.613.7 57.811.5 - 46.915.5 %   Platelets 258 150 - 400 K/uL  Differential     Status: None   Collection Time: 07/07/16  1:57 AM  Result Value Ref Range   Neutrophils Relative % 54 %   Neutro Abs 5.1 1.7 - 7.7  K/uL   Lymphocytes Relative 33 %   Lymphs Abs 3.1 0.7 - 4.0 K/uL   Monocytes Relative 11 %   Monocytes Absolute 1.0 0.1 - 1.0 K/uL   Eosinophils Relative 2 %   Eosinophils Absolute 0.2 0.0 - 0.7 K/uL   Basophils Relative 0 %   Basophils Absolute 0.0 0.0 - 0.1 K/uL   Ct Head Wo Contrast  Result Date: 07/07/2016 CLINICAL DATA:  LEFT-sided weakness, last seen normal at 2300 hours. History of hypertension, diabetes and stroke. EXAM: CT HEAD WITHOUT CONTRAST TECHNIQUE: Contiguous axial images were obtained from the base of the skull through the vertex without intravenous contrast. COMPARISON:  None. FINDINGS: BRAIN: No intraparenchymal hemorrhage, mass effect, midline shift  or acute large vascular territory infarcts. LEFT frontotemporal/insular encephalomalacia. Old LEFT basal ganglia and thalamus infarcts with ex vacuo dilatation LEFT lateral ventricle. No hydrocephalus. LEFT basal ganglia mineralization. Patchy to confluent supratentorial white matter hypodensities. Diminutive LEFT cerebral peduncle compatible with wallerian degeneration. No abnormal extra-axial fluid collections. Basal cisterns are patent. VASCULAR: Mild calcific atherosclerosis the carotid siphons. No dense MCA. SKULL: No skull fracture. No significant scalp soft tissue swelling. SINUSES/ORBITS: The mastoid air-cells and included paranasal sinuses are well-aerated.The included ocular globes and orbital contents are non-suspicious. OTHER: None. IMPRESSION: No acute intracranial process. Old moderate LEFT MCA territory infarct. Moderate to severe white matter changes compatible with chronic small vessel ischemic disease. Acute findings discussed with and reconfirmed by Dr.Montario Zilka, Neurology on 07/07/2016 at 2:19 am. Electronically Signed   By: Awilda Metro M.D.   On: 07/07/2016 02:20    Assessment: 70 y.o. male with a history of markedly debilitating left MCA stroke, seizure disorder and hypertension presenting with possible right  MCA territory TIA. However, acute clinical changes could well have been manifestations of partial seizure activity. CT scan of his head was unremarkable. He apparently is back to baseline with speech and movement of left upper extremity. MRI of his brain to rule out recurrent stroke is pending.  Stroke Risk Factors - diabetes mellitus and hypertension  Plan: 1. Admission and stroke workup as well as PT, OT and speech therapy if MRI is positive for acute stroke 2. Loading dose of Keppra 1000 mg IV, followed by an increase in daily dose of Keppra to 500 mg twice a day 3. Continue long-term anticoagulation with Coumadin   C.R. Roseanne Reno, MD [facility] [phone]  07/07/2016, 2:32 AM

## 2016-07-07 NOTE — Progress Notes (Signed)
Code Stroke called on 70 y.o male. Pertinent PMH includes stroke within last month, DM, HTN, Diabetic retinopathy, aphasia, slurred speech, flaccid right side, seizures, chronic kidney disease, depression. Pt lives at [facility], per staff he was LSN at 2300. Pt found late tonight unable to speak and unable to lift left arm per SNF staff this is not his baseline. Pt taken to CT scan STAT after labs collected and cleared per EDP. NIHSS completed with Neurologist Dr. Roseanne RenoStewart yielding 16. See flow sheet for specific NIHSS. Most scored elements are from residual stroke symptoms. Due to Patients recent history of stroke and DM as well as being on anticoagulants he is now out of the window for TPA at this time. Pt for MRI tonight, admit pending result.

## 2016-07-07 NOTE — H&P (Signed)
History and Physical    Gilbert Lewis UJW:[id number] DOB: [birth year] DOA: 07/07/2016  PCP: Oneal Grout, MD Patient coming from: [name]  Chief Complaint: difficulty w/ speech and L sided weakness.   HPI: Gilbert Lewis is a 70 y.o. male with medical history significant of CKD, depression, CVA with residual right-sided weakness, DM, retinopathy, HTN, presenting from nursing home, [name] for evaluation of difficulty speaking and left-sided weakness. Patient last known normal sometime prior to 2300 on 07/06/2016. Nursing home staff noted that patient was having a hard time finding words to speak and had minimal use of left upper and lower extremities. EMS was called and patient brought to [facility] emergency room. 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 Code stroke was initiated when patient arrived to [facility] ED. Some symptoms began to resolve and CT scan initially showed no stroke. Patient is not able to receive an MRI due to spinal surgery and difficulty with getting in the scanner. Currently patient denies any focal complaints other than difficulty with speech, including chest pain, shortness breath, palpitations, neck stiffness, headache, vertigo, dysuria, frequency, fevers. Patient states that his left-sided weakness has resolved but his speech continues to be difficult. Denies any recent seizures  ED Course: objective findings below.   Review of Systems: As per HPI otherwise 10 point review of systems negative.   Ambulatory Status: R sided weakness from stroke.   Past Medical History:  Diagnosis Date  . Cataract   . CKD (chronic kidney disease)   . Depression   . Diabetes mellitus without complication (HCC)   . Diabetic retinopathy (HCC)   . Hypertension   . Stroke [facility])     History reviewed. No pertinent surgical history.  Social History   Social History  . Marital  status: Single    Spouse name: N/A  . Number of children: N/A  . Years of education: N/A   Occupational History  . Not on file.   Social History Main Topics  . Smoking status: Never Smoker  . Smokeless tobacco: Never Used  . Alcohol use No  . Drug use: No  . Sexual activity: No   Other Topics Concern  . Not on file   Social History Narrative  . No narrative on file    Allergies  Allergen Reactions  . Latex Other (See Comments)    Per MAR  . Macrobid [employer] Macro] Other (See Comments)    Per MAR  . Morphine And Related Other (See Comments)    Per MAR    Family History  Problem Relation Age of Onset  . Family history unknown: Yes    Prior to Admission medications   Medication Sig Start Date End Date Taking? Authorizing Provider  acetaminophen (TYLENOL) 325 MG tablet Take 650 mg by mouth every 4 (four) hours as needed for mild pain.   Yes Historical Provider, MD  Amino Acids-Protein Hydrolys (FEEDING SUPPLEMENT, PRO-STAT SUGAR FREE 64,) LIQD Take 30 mLs by mouth 2 (two) times daily.   Yes Historical Provider, MD  baclofen (LIORESAL) 10 MG tablet Take 10 mg by mouth 3 (three) times daily.   Yes Historical Provider, MD  ferrous sulfate 325 (65 FE) MG tablet Take 325 mg by mouth daily with breakfast.   Yes Historical Provider, MD  finasteride (PROSCAR) 5 MG tablet Take 5 mg by mouth every evening.    Yes Historical Provider, MD  levETIRAcetam (KEPPRA) 250 MG tablet Take 250 mg by mouth 2 (two) times daily.   Yes Historical Provider, MD  LORazepam (ATIVAN) 0.5 MG tablet Take 0.5 tablets (0.25 mg total) by mouth at bedtime. 07/03/16  Yes Belkys A Regalado, MD  losartan (COZAAR) 50 MG tablet Take 50 mg by mouth daily.   Yes Historical Provider, MD  Melatonin 10 MG TABS Take 10 mg by mouth at bedtime.    Yes Historical Provider, MD  metFORMIN (GLUCOPHAGE) 1000 MG tablet Take 1,000 mg by mouth 2 (two) times daily with a meal.   Yes Historical Provider, MD    metoprolol tartrate (LOPRESSOR) 25 MG tablet Take 12.5 mg by mouth 2 (two) times daily.   Yes Historical Provider, MD  Multiple Vitamins-Minerals (DECUBI-VITE) CAPS Take 1 capsule by mouth daily.   Yes Historical Provider, MD  omeprazole (PRILOSEC) 20 MG capsule Take 20 mg by mouth daily.   Yes Historical Provider, MD  ondansetron (ZOFRAN) 8 MG tablet Take 8 mg by mouth every 8 (eight) hours as needed for nausea or vomiting.    Yes Historical Provider, MD  Oxycodone HCl 10 MG TABS Take one tablet by mouth every 6 hours as needed for severe pain 07/05/16  Yes Sharon Seller, NP  polyethylene glycol (MIRALAX / GLYCOLAX) packet Take 17 g by mouth daily.   Yes Historical Provider, MD  senna (SENOKOT) 8.6 MG tablet Take 2 tablets by mouth 2 (two) times daily.   Yes Historical Provider, MD  sertraline (ZOLOFT) 100 MG tablet Take 150 mg by mouth daily.   Yes Historical Provider, MD  sodium chloride 1 g tablet Take 2 g by mouth 2 (two) times daily with a meal.   Yes Historical Provider, MD  solifenacin (VESICARE) 5 MG tablet Take 5 mg by mouth daily.   Yes Historical Provider, MD  sucralfate (CARAFATE) 1 g tablet Take 1 g by mouth 4 (four) times daily -  with meals and at bedtime.   Yes Historical Provider, MD  tamsulosin (FLOMAX) 0.4 MG CAPS capsule Take 0.4 mg by mouth daily after supper.   Yes Historical Provider, MD  ursodiol (ACTIGALL) 500 MG tablet Take 500 mg by mouth 2 (two) times daily.   Yes Historical Provider, MD  vitamin B-12 (CYANOCOBALAMIN) 1000 MCG tablet Take 1,000 mcg by mouth daily.   Yes Historical Provider, MD  warfarin (COUMADIN) 7.5 MG tablet Take 1 tablet (7.5 mg total) by mouth daily at 6 PM. 07/03/16  Yes Belkys A Regalado, MD  enoxaparin (LOVENOX) 120 MG/0.8ML injection Inject 0.8 mLs (120 mg total) into the skin daily. Patient not taking: Reported on 07/07/2016 07/03/16   Alba Cory, MD    Physical Exam: Vitals:   07/07/16 0530 07/07/16 0545 07/07/16 0637 07/07/16  0800  BP: 148/75 137/79 (!) 141/74 134/78  Pulse: 94 95 100 80  Resp: 20 13 18 16   Temp:   98.6 F (37 C) (!) 96.8 F (36 C)  TempSrc:   Oral Oral  SpO2: 96% 96% 94% 94%  Weight:      Height:         General:  Appears calm and comfortable Eyes:  PERRL, EOMI, normal lids, iris ENT:  grossly normal hearing, lips & tongue, mmm Neck:  no LAD, masses or thyromegaly Cardiovascular:  RRR, no m/r/g. No LE edema.  Respiratory:  CTA bilaterally, no w/r/r. Normal respiratory effort. Abdomen:  soft, ntnd, NABS Skin:  no rash or induration seen on limited exam Musculoskeletal:  Contracture of right upper extremity noted, weakness of right upper and lower extremities. No bony abnormalities. Left side strength 4-5 with arm flexion and hip flexion. Psychiatric: Patient very slow to speak and has difficulty with complex sentence structure. Patient able to answer very simple questions. Pleasant  Neurologic: CN 2-12 grossly intact, right upper extremity with fixed contractures and minimal movement, right lower extremity with minimal movement, left upper and lower extremity as noted above   Labs on Admission: I have personally reviewed following labs and imaging studies  CBC:  Recent Labs Lab 06/30/16 1616 07/01/16 0338 07/02/16 0508 07/03/16 0243 07/07/16 0156 07/07/16 0157  WBC 7.6 6.7 5.9 6.7  --  9.5  NEUTROABS 4.9  --   --   --   --  5.1  HGB 12.0* 11.1* 11.1* 11.3* 12.9* 12.4*  HCT 37.1* 34.4*  32.5* 33.6* 34.5* 38.0* 38.0*  MCV 87.7 87.3 86.4 87.8  --  89.0  PLT 253 237 229 232  --  258   Basic Metabolic Panel:  Recent Labs Lab 06/30/16 1616 07/01/16 0338 07/07/16 0156 07/07/16 0157  NA 136 134* 137 134*  K 4.3 3.6 4.2 4.2  CL 100* 99* 101 100*  CO2 28 27  --  25  GLUCOSE 122* 116* 86 92  BUN 19 16 26* 23*  CREATININE 0.87 0.86 0.90 0.97  CALCIUM 9.2 8.9  --  9.4   GFR: Estimated Creatinine Clearance: 83.1 mL/min (by C-G formula based on SCr of 0.97 mg/dL). Liver  Function Tests:  Recent Labs Lab 06/30/16 1616 07/01/16 0338 07/07/16 0157  AST 18 16 18   ALT 13* 12* 17  ALKPHOS 142* 134* 140*  BILITOT 0.6 0.7 0.6  PROT 6.6 6.2* 6.8  ALBUMIN 3.5 3.3* 3.8   No results for input(s): LIPASE, AMYLASE in the last 168 hours. No results for input(s): AMMONIA in the last 168 hours. Coagulation Profile:  Recent Labs Lab 06/30/16 1616 07/02/16 0508 07/03/16 0243 07/07/16 0157  INR 1.20 1.14 1.42 1.14   Cardiac Enzymes:  Recent Labs Lab 06/30/16 1616  TROPONINI <0.03   BNP (last 3 results) No results for input(s): PROBNP in the last 8760 hours. HbA1C: No results for input(s): HGBA1C in the last 72 hours. CBG:  Recent Labs Lab 07/02/16 1613 07/02/16 2026 07/03/16 0613 07/03/16 1114 07/07/16 0150  GLUCAP 130* 145* 102* 156* 92   Lipid Profile: No results for input(s): CHOL, HDL, LDLCALC, TRIG, CHOLHDL, LDLDIRECT in the last 72 hours. Thyroid Function Tests: No results for input(s): TSH, T4TOTAL, FREET4, T3FREE, THYROIDAB in the last 72 hours. Anemia Panel: No results for input(s): VITAMINB12, FOLATE, FERRITIN, TIBC, IRON, RETICCTPCT in the last 72 hours. Urine analysis: No results found for: COLORURINE, APPEARANCEUR, LABSPEC, PHURINE, GLUCOSEU, HGBUR, BILIRUBINUR, KETONESUR, PROTEINUR, UROBILINOGEN, NITRITE, LEUKOCYTESUR  Creatinine Clearance: Estimated Creatinine Clearance: 83.1 mL/min (by C-G formula based on SCr of 0.97 mg/dL).  Sepsis Labs: @LABRCNTIP (procalcitonin:4,lacticidven:4) ) Recent Results (from the past 240 hour(s))  MRSA PCR Screening     Status: None   Collection Time: 06/30/16 10:26 PM  Result Value Ref Range Status   MRSA by PCR NEGATIVE NEGATIVE Final    Comment:        The GeneXpert MRSA Assay (FDA approved for NASAL specimens only), is one component of a comprehensive MRSA colonization surveillance program. It is not intended to diagnose MRSA infection nor to guide or monitor treatment for MRSA  infections.      Radiological Exams on Admission: Ct Head Wo Contrast  Result Date: 07/07/2016 CLINICAL DATA:  LEFT-sided weakness, last seen normal at 2300 hours. History of hypertension, diabetes and stroke. EXAM: CT HEAD WITHOUT CONTRAST TECHNIQUE: Contiguous axial images were obtained from the base of the skull through the vertex without intravenous contrast. COMPARISON:  None. FINDINGS: BRAIN: No intraparenchymal hemorrhage, mass effect, midline shift or acute large vascular territory infarcts. LEFT frontotemporal/insular encephalomalacia. Old LEFT basal ganglia and thalamus infarcts with ex vacuo dilatation LEFT lateral ventricle. No hydrocephalus. LEFT basal ganglia mineralization. Patchy to confluent supratentorial white matter hypodensities. Diminutive LEFT cerebral peduncle compatible with wallerian degeneration. No abnormal extra-axial fluid collections. Basal cisterns are patent. VASCULAR: Mild calcific atherosclerosis the carotid siphons. No dense MCA. SKULL: No skull fracture. No significant scalp soft tissue swelling. SINUSES/ORBITS: The mastoid air-cells and included paranasal sinuses are well-aerated.The included ocular globes and orbital contents are non-suspicious. OTHER: None. IMPRESSION: No acute intracranial process. Old moderate LEFT MCA territory infarct. Moderate to severe white matter changes compatible with chronic small vessel ischemic disease. Acute findings discussed with and reconfirmed by Dr.Stewart, Neurology on 07/07/2016 at 2:19 am. Electronically Signed   By: Awilda Metro M.D.   On: 07/07/2016 02:20    EKG: Independently reviewed.   Assessment/Plan Active Problems:   BPH (benign prostatic hyperplasia)   Chronic pain syndrome   DVT (deep venous thrombosis) (HCC)   TIA (transient ischemic attack)   Diabetes mellitus with complication (HCC)   Dysarthria   Seizures (HCC)   Dysarthria/R sided weakness: TIA vs stroke. LUE and RUE weakness resolved but continues  to have dysarthria. Neuro evaluated and feels Sx may be from partial szr vs TIA/Stroke. Unable to have MRI due to spinal surgery and not being able to fit  - Tele - anticipate transfer back to [facility] for further PT - Echo, Carrotid dopplers, CTA head, (done at outside hospital w/ revious stroke and records not on care everywhere???) - Stroke orderset. - PT/OT/SLP  Seizures: no recent reported seizure. Neuro concerned about partial seizures causing pts symptoms.  - Loading dose Keppra 1000mg  IV per neuro - Keppra 500 BID thereafter per neuro (typical home dose of 250 BID)   Chronic pain: back surgery and contractures from stroke - continue oxycodone, baclofen - PT/OT  Depression/Anxiety: - continue zoloft, ativan  DVT: failed Xarelto on previous admission - Coumadin  BPH: continue proscar, flomax  DM: last A1c 5.4 - pre-dm. - hold metformin - SSI  HLD: - continue actigall  GERD: - continue ppi, carafate  HTN: - allow permissive HTN, hold home BP meds   DVT prophylaxis: coumadin  Code Status: full  Family Communication: none  Disposition Plan: pending workup  Consults called: neuro  Admission status: observation    Jensine Luz J MD [facility]  If 7PM-7AM, please contact night-coverage [email or web address] Password [email or web address]  07/07/2016, 9:05 AM

## 2016-07-07 NOTE — ED Notes (Signed)
Patient returned from MRI.

## 2016-07-07 NOTE — ED Triage Notes (Signed)
Patient presents from [facility] via EMS.  Staff reported he was last seen normal at 2300 and found him with increased weakness to the left side and the inability to communicate.  1 month ago had a stroke that affected the right side

## 2016-07-07 NOTE — Progress Notes (Signed)
MBSS complete. Full report located under chart review in imaging section.  Annalie Wenner Paiewonsky, M.A. CCC-SLP [phone]  

## 2016-07-07 NOTE — Evaluation (Signed)
Clinical/Bedside Swallow Evaluation Patient Details  Name: Gilbert Lewis MRN: [id number] Date of Birth: November 02, 1945  Today's Date: 07/07/2016 Time: SLP Start Time (ACUTE ONLY): 4098 SLP Stop Time (ACUTE ONLY): 1015 SLP Time Calculation (min) (ACUTE ONLY): 26 min  Past Medical History:  Past Medical History:  Diagnosis Date  . Cataract   . CKD (chronic kidney disease)   . Depression   . Diabetes mellitus without complication (HCC)   . Diabetic retinopathy (HCC)   . Hypertension   . Stroke [facility])    Past Surgical History: History reviewed. No pertinent surgical history. HPI:  70 y.o.malewith medical history significant of CKD, depression, CVA with residual right-sided weakness, DM, retinopathy, HTN, presenting with difficulty speaking and left-sided weakness. Head CT showed old moderate LEFT MCA territory infarct and no acute infarct. Pt failed SSS due to h/o dysphagia.   Assessment / Plan / Recommendation Clinical Impression  Pt failed SSS due to reported h/o dysphagia. He consumed thin liquids with a noticeable slight increase in work of breathing following trials. He had 1 episode of what appeared to be an immediate throat clear following pureed solids, but this was not noted during additional trials. No difficulty was observed given ice chips and regular solids. Pt has general oral weakness and was cognitively unable to elicit a volitional cough and swallow. His voice has low vocal intensity. Given pt's mentation and performance at bedside, recommend proceeding with MBS for further analysis of swallow function prior to intiating a diet.    Aspiration Risk  Mild aspiration risk    Diet Recommendation NPO   Medication Administration: Crushed with puree    Other  Recommendations Oral Care Recommendations: Oral care QID   Follow up Recommendations 24 hour supervision/assistance;Skilled Nursing facility      Frequency and Duration       Prognosis Prognosis for Safe Diet  Advancement: Good Barriers to Reach Goals: Cognitive deficits      Swallow Study   General HPI: 70 y.o.malewith medical history significant of CKD, depression, CVA with residual right-sided weakness, DM, retinopathy, HTN, presenting with difficulty speaking and left-sided weakness. Head CT showed old moderate LEFT MCA territory infarct and no acute infarct. Pt failed SSS due to h/o dysphagia. Type of Study: Bedside Swallow Evaluation Previous Swallow Assessment: none in chart Diet Prior to this Study: NPO Temperature Spikes Noted: No Respiratory Status: Room air History of Recent Intubation: No Behavior/Cognition: Alert;Cooperative;Requires cueing Oral Cavity Assessment: Within Functional Limits Oral Care Completed by SLP: No Oral Cavity - Dentition: Poor condition;Missing dentition Self-Feeding Abilities: Total assist Patient Positioning: Upright in bed Baseline Vocal Quality: Low vocal intensity Volitional Cough: Cognitively unable to elicit Volitional Swallow: Unable to elicit    Oral/Motor/Sensory Function Overall Oral Motor/Sensory Function: Generalized oral weakness   Ice Chips Ice chips: Within functional limits Presentation: Spoon   Thin Liquid Thin Liquid: Impaired Presentation: Straw;Spoon;Cup Other Comments: increased work of breathing    Nectar Thick Nectar Thick Liquid: Not tested   Honey Thick Honey Thick Liquid: Not tested   Puree Puree: Impaired Presentation: Spoon Pharyngeal Phase Impairments: Throat Clearing - Immediate   Solid   GO   Solid: Within functional limits Presentation: Self Fed    Functional Assessment Tool Used: skilled clinical judgment Functional Limitations: Swallowing Swallow Current Status (J1914): At least 40 percent but less than 60 percent impaired, limited or restricted Swallow Goal Status [phone]): At least 20 percent but less than 40 percent impaired, limited or restricted  Tollie Eth, Student SLP  [place] Feldpausch R  Mattheus Rauls 07/07/2016,11:27 AM

## 2016-07-07 NOTE — ED Notes (Signed)
Condom cath applied in an attempt to catch urine for UA

## 2016-07-07 NOTE — Progress Notes (Addendum)
This is a no charge note  Pending admission per Dr. Preston FleetingGlick 70 year old male with past medical history of hypertension, diabetes mellitus, stroke, CKD, depression, DVT on Coumadin, who presents with difficulty speaking and left arm weakness. CT-head no acute intracranial abnormalities. Neurology, Dr. Roseanne RenoStewart was consulted, recommended MRI of brain, but patient cannot do MRI due to unable to stand still. Per Dr. Roseanne RenoStewart, pt needs loading dose of Keppra 1000 mg IV, followed by an increase in daily dose of Keppra to 500 mg twice a day. Pt is accepted to tele bed for obs.  Gilbert HarpXilin [name], MD  [facility] Pager [phone]  If 7PM-7AM, please contact night-coverage [email or web address] Password [email or web address] 07/07/2016, 5:31 AM

## 2016-07-07 NOTE — ED Provider Notes (Signed)
[facility]-EMERGENCY DEPT Provider Note   CSN: [id number] Arrival date & time: 07/07/16  0148  By signing my name below, I, Linna Darner, attest that this documentation has been prepared under the direction and in the presence of physician practitioner, Dione Booze, MD. Electronically Signed: Linna Darner, Scribe. 07/07/2016. 1:55 AM.  History   Chief Complaint Chief Complaint  Patient presents with  . Code Stroke    The history is provided by the EMS personnel. No language interpreter was used.     HPI Comments: LEVEL 5 CAVEAT FOR APHASIA Gilbert Lewis is a 70 y.o. male brought in by EMS, with PMHx of stroke, who presents to the Emergency Department as a code stroke. Per EMS, pt experienced left-sided weakness and speech difficulty about three hours ago. EMS reports pt can usually articulate and carry on a conversation but became unable to do so around this time. EMS notes pt previously suffered a stroke that affected his right side. No other complaints at this time.  Past Medical History:  Diagnosis Date  . Cataract   . CKD (chronic kidney disease)   . Depression   . Diabetes mellitus without complication (HCC)   . Diabetic retinopathy (HCC)   . Hypertension   . Stroke [facility])     Patient Active Problem List   Diagnosis Date Noted  . Pressure injury of skin 07/01/2016  . DVT (deep venous thrombosis), right 06/30/2016  . Slurred speech   . Benign hypertensive heart disease without heart failure 06/13/2016  . 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 Type 2 diabetes mellitus with stage 3 chronic kidney disease, without long-term current use of insulin (HCC) 06/13/2016  . Major depression, chronic 06/13/2016  . BPH (benign prostatic hyperplasia) 06/13/2016  . OAB (overactive bladder) 06/13/2016  . Anemia, iron deficiency  06/13/2016  . 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 Insomnia 06/13/2016  . Chronic gastric ulcer 06/13/2016  . CKD (chronic kidney disease) stage 3, GFR 30-59 ml/min 06/13/2016  . Chronic constipation 06/13/2016  . Chronic hyponatremia 06/13/2016  . Chronic pain syndrome 06/13/2016  . Calculus of gallbladder and bile duct with obstruction without cholecystitis 06/13/2016  . History of DVT (deep vein thrombosis) 06/13/2016    No past surgical history on file.     Home Medications    Prior to Admission medications   Medication Sig Start Date End Date Taking? Authorizing Provider  acetaminophen (TYLENOL) 325 MG tablet Take 650 mg by mouth every 4 (four) hours as needed for mild pain.    Historical Provider, MD  Amino Acids-Protein Hydrolys (FEEDING SUPPLEMENT, PRO-STAT SUGAR FREE 64,) LIQD Take 30 mLs by mouth 2 (two) times daily.    Historical Provider, MD  baclofen (LIORESAL) 10 MG tablet Take 10 mg by mouth 3 (three) times daily.    Historical Provider, MD  enoxaparin (LOVENOX) 120 MG/0.8ML injection Inject 0.8 mLs (120 mg total) into the skin daily. 07/03/16   Belkys A Regalado, MD  ferrous sulfate 325 (65 FE) MG tablet Take 325 mg by mouth daily with breakfast.    Historical Provider, MD  finasteride (PROSCAR) 5 MG tablet Take 5 mg by mouth every evening.     Historical Provider, MD  levETIRAcetam (KEPPRA) 250 MG tablet Take 250 mg by mouth 2 (two) times daily.    Historical Provider, MD  LORazepam (ATIVAN) 0.5 MG tablet Take 0.5 tablets (  0.25 mg total) by mouth at bedtime. 07/03/16   Belkys A Regalado, MD  losartan (COZAAR) 50 MG tablet Take 50 mg by mouth daily.    Historical Provider, MD  Melatonin 10 MG TABS Take 10 mg by mouth at bedtime.     Historical Provider, MD  metFORMIN (GLUCOPHAGE) 1000 MG tablet Take 1,000 mg by mouth 2 (two) times daily with a meal.    Historical Provider, MD  metoprolol tartrate (LOPRESSOR) 25 MG tablet Take 12.5 mg by mouth 2 (two) times daily.     Historical Provider, MD  Multiple Vitamins-Minerals (DECUBI-VITE) CAPS Take 1 capsule by mouth daily.    Historical Provider, MD  omeprazole (PRILOSEC) 20 MG capsule Take 20 mg by mouth daily.    Historical Provider, MD  ondansetron (ZOFRAN) 8 MG tablet Take 8 mg by mouth every 8 (eight) hours as needed for nausea or vomiting.     Historical Provider, MD  Oxycodone HCl 10 MG TABS Take one tablet by mouth every 6 hours as needed for severe pain 07/05/16   Sharon Seller, NP  pioglitazone (ACTOS) 15 MG tablet Take 7.5 mg by mouth daily.    Historical Provider, MD  polyethylene glycol (MIRALAX / GLYCOLAX) packet Take 17 g by mouth daily.    Historical Provider, MD  senna (SENOKOT) 8.6 MG tablet Take 2 tablets by mouth 2 (two) times daily.    Historical Provider, MD  sertraline (ZOLOFT) 100 MG tablet Take 150 mg by mouth daily.    Historical Provider, MD  sodium chloride 1 g tablet Take 2 g by mouth 2 (two) times daily with a meal.    Historical Provider, MD  solifenacin (VESICARE) 5 MG tablet Take 5 mg by mouth daily.    Historical Provider, MD  sucralfate (CARAFATE) 1 g tablet Take 1 g by mouth 4 (four) times daily -  with meals and at bedtime.    Historical Provider, MD  tamsulosin (FLOMAX) 0.4 MG CAPS capsule Take 0.4 mg by mouth daily after supper.    Historical Provider, MD  ursodiol (ACTIGALL) 500 MG tablet Take 500 mg by mouth 2 (two) times daily.    Historical Provider, MD  vitamin B-12 (CYANOCOBALAMIN) 1000 MCG tablet Take 1,000 mcg by mouth daily.    Historical Provider, MD  warfarin (COUMADIN) 7.5 MG tablet Take 1 tablet (7.5 mg total) by mouth daily at 6 PM. 07/03/16   Alba Cory, MD    Family History Family History  Problem Relation Age of Onset  . Family history unknown: Yes    Social History Social History  Substance Use Topics  . Smoking status: Never Smoker  . Smokeless tobacco: Never Used  . Alcohol use No     Allergies   Latex; Macrobid [nitrofurantoin  monohyd macro]; and Morphine and related   Review of Systems Review of Systems  Unable to perform ROS: Acuity of condition  Neurological: Positive for speech difficulty and weakness (left side).  All other systems reviewed and are negative.   Physical Exam Updated Vital Signs Ht 6\' 4"  (1.93 m)   Wt 177 lb 11.1 oz (80.6 kg)   SpO2 99%   BMI 21.63 kg/m   Physical Exam  Constitutional: He is oriented to person, place, and time. He appears well-developed and well-nourished.  HENT:  Head: Normocephalic and atraumatic.  Eyes: EOM are normal. Pupils are equal, round, and reactive to light.  Neck: Normal range of motion. Neck supple. No JVD present.  Cardiovascular: Normal  rate, regular rhythm and normal heart sounds.   No murmur heard. Pulmonary/Chest: Effort normal and breath sounds normal. He has no wheezes. He has no rales. He exhibits no tenderness.  Abdominal: Soft. Bowel sounds are normal. He exhibits no distension and no mass. There is no tenderness.  Musculoskeletal: Normal range of motion. He exhibits no edema.  1+ pretibial edema. Marked spasticity bilaterally, worse on the right. Extension contractures of all four extremities, worse on the right.  Lymphadenopathy:    He has no cervical adenopathy.  Neurological: He is alert and oriented to person, place, and time. No cranial nerve deficit. He exhibits normal muscle tone. Coordination normal.  Awake and alert. Speech slow and moderately dysarthric. Right central facial droop, tongue protrudes to the right. Severe right spastic hemiparesis. Mild to moderate left spastic hemiparesis.   Skin: Skin is warm and dry. No rash noted.  Psychiatric: He has a normal mood and affect. His behavior is normal. Judgment and thought content normal.  Nursing note and vitals reviewed.   ED Treatments / Results  Labs (all labs ordered are listed, but only abnormal results are displayed) Labs Reviewed  CBC - Abnormal; Notable for the  following:       Result Value   Hemoglobin 12.4 (*)    HCT 38.0 (*)    All other components within normal limits  COMPREHENSIVE METABOLIC PANEL - Abnormal; Notable for the following:    Sodium 134 (*)    Chloride 100 (*)    BUN 23 (*)    Alkaline Phosphatase 140 (*)    All other components within normal limits  I-STAT CHEM 8, ED - Abnormal; Notable for the following:    BUN 26 (*)    Hemoglobin 12.9 (*)    HCT 38.0 (*)    All other components within normal limits  ETHANOL  PROTIME-INR  APTT  DIFFERENTIAL  URINE RAPID DRUG SCREEN, HOSP PERFORMED  URINALYSIS, ROUTINE W REFLEX MICROSCOPIC (NOT AT [facility])  CBG MONITORING, ED  Rosezena Sensor, ED    EKG  EKG Interpretation  Date/Time:  Thursday July 07 2016 02:14:46 EDT Ventricular Rate:  153 PR Interval:    QRS Duration: 175 QT Interval:  326 QTC Calculation: 447 R Axis:   0 Text Interpretation:  Sinus rhythm Muscle tremor When compared with ECG of 06/30/2016, Muscle tremor Artifact is worse Confirmed by Preston Fleeting  MD, Tekeya Geffert ([id number]) on 07/07/2016 2:29:40 AM       Radiology Ct Head Wo Contrast  Result Date: 07/07/2016 CLINICAL DATA:  LEFT-sided weakness, last seen normal at 2300 hours. History of hypertension, diabetes and stroke. EXAM: CT HEAD WITHOUT CONTRAST TECHNIQUE: Contiguous axial images were obtained from the base of the skull through the vertex without intravenous contrast. COMPARISON:  None. FINDINGS: BRAIN: No intraparenchymal hemorrhage, mass effect, midline shift or acute large vascular territory infarcts. LEFT frontotemporal/insular encephalomalacia. Old LEFT basal ganglia and thalamus infarcts with ex vacuo dilatation LEFT lateral ventricle. No hydrocephalus. LEFT basal ganglia mineralization. Patchy to confluent supratentorial white matter hypodensities. Diminutive LEFT cerebral peduncle compatible with wallerian degeneration. No abnormal extra-axial fluid collections. Basal cisterns are patent. VASCULAR: Mild  calcific atherosclerosis the carotid siphons. No dense MCA. SKULL: No skull fracture. No significant scalp soft tissue swelling. SINUSES/ORBITS: The mastoid air-cells and included paranasal sinuses are well-aerated.The included ocular globes and orbital contents are non-suspicious. OTHER: None. IMPRESSION: No acute intracranial process. Old moderate LEFT MCA territory infarct. Moderate to severe white matter changes compatible with chronic small vessel  ischemic disease. Acute findings discussed with and reconfirmed by Dr.Stewart, Neurology on 07/07/2016 at 2:19 am. Electronically Signed   By: Awilda Metroourtnay  Bloomer M.D.   On: 07/07/2016 02:20    Procedures  Procedures (including critical care time) CRITICAL CARE Performed by: EAVWU,JWJXBGLICK,Latrelle Bazar Total critical care time: 45 minutes Critical care time was exclusive of separately billable procedures and treating other patients. Critical care was necessary to treat or prevent imminent or life-threatening deterioration. Critical care was time spent personally by me on the following activities: development of treatment plan with patient and/or surrogate as well as nursing, discussions with consultants, evaluation of patient's response to treatment, examination of patient, obtaining history from patient or surrogate, ordering and performing treatments and interventions, ordering and review of laboratory studies, ordering and review of radiographic studies, pulse oximetry and re-evaluation of patient's condition.  DIAGNOSTIC STUDIES: Oxygen Saturation is 100% on RA, normal by my interpretation.    Medications Ordered in ED Medications  oxyCODONE (Oxy IR/ROXICODONE) immediate release tablet 5 mg (not administered)  baclofen (LIORESAL) tablet 10 mg (not administered)     Initial Impression / Assessment and Plan / ED Course  I have reviewed the triage vital signs and the nursing notes.  Pertinent labs & imaging results that were available during my care of the  patient were reviewed by me and considered in my medical decision making (see chart for details).  Clinical Course   70 year old male presented as a code stroke. Review of old records shows that he had been hospitalized several weeks ago with possible altered speech but it was found to be at baseline. Description of his neurologic exam seems similar to what we're seeing on presentation. Patient was seen in conjunction with Dr. Roseanne RenoStewart of neurology service. Dr. Roseanne RenoStewart canceled code stroke. Initial CT showed evidence of prior infarct. MRI was ordered to evaluate for possible acute stroke. However, patient was not able to tolerate placement an MRI scanner because of a neck fusion and inability to lay flat. In the meantime, he started moving his left arm much more in speaking much more clearly. This suggests that he had a transient ischemic attack. 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 I can find no evidence of recent cerebrovascular imaging. Therefore, he will need to be admitted for TIA workup. Case is discussed with Dr. Clyde LundborgNiu of [facility] hospital[facility] who agrees to admit him under observation status. Also, recent hospitalization showed a DVT which occurred on rivaroxaban therapy and he was placed on warfarin. INR is subtherapeutic now, so he is given a dose of an occiput.  I personally performed the services described in this documentation, which was scribed in my presence. The recorded information has been reviewed and is accurate.     Final Clinical Impressions(s) / ED Diagnoses   Final diagnoses:  Transient cerebral ischemia, unspecified type  Normochromic normocytic anemia  Elevated BUN  Subtherapeutic international normalized ratio (INR)    New Prescriptions New Prescriptions   No medications on file     Dione Boozeavid Mahalie Kanner, MD 07/07/16 [phone]

## 2016-07-07 NOTE — Progress Notes (Addendum)
ANTICOAGULATION CONSULT NOTE - Initial Consult  Pharmacy Consult for Coumadin Indication: DVT in right leg 07/02/16  Allergies  Allergen Reactions  . Latex Other (See Comments)    Per MAR  . Macrobid [employer][Nitrofurantoin Monohyd Macro] Other (See Comments)    Per MAR  . Morphine And Related Other (See Comments)    [facility]    Patient Measurements: Height: 6\' 4"  (193 cm) Weight: 177 lb 11.1 oz (80.6 kg) IBW/kg (Calculated) : 86.8 Heparin Dosing Weight: 80.6 kg  Vital Signs: Temp: 96.8 F (36 C) (10/05 0800) Temp Source: Oral (10/05 0800) BP: 139/85 (10/05 1000) Pulse Rate: 92 (10/05 1000)  Labs:  Recent Labs  07/07/16 0156 07/07/16 0157  HGB 12.9* 12.4*  HCT 38.0* 38.0*  PLT  --  258  APTT  --  29  LABPROT  --  14.6  INR  --  1.14  CREATININE 0.90 0.97    Estimated Creatinine Clearance: 83.1 mL/min (by C-G formula based on SCr of 0.97 mg/dL).   Medical History: Past Medical History:  Diagnosis Date  . Cataract   . CKD (chronic kidney disease)   . Depression   . Diabetes mellitus without complication (HCC)   . Diabetic retinopathy (HCC)   . Hypertension   . Stroke [facility](HCC)    Assessment:   70 yr old male admitted overnight with left-sided weakness and difficulty with speech. Weakness noted to have resolved but speech difficulty continues.     Just discharged 07/03/16   Previously on Xarelto, but developed DVT while on Xarelto and changed to Lovenox and Coumadin.  Discharged on 10/1 with INR 1.42 after Coumadin 7.5 mg daily x 2, and plan to continue Lovenox 120 mg sq q24hrs until INR >2.  INR 1.14 today.  Sp[facility] with [facility]N at [facility].  She confirmed that he did receive Coumadin on 10/2-10/4.  Last Lovenox dose 10/2 pm. No INR information available.  Goal of Therapy:  INR 2-3 Monitor platelets by anticoagulation protocol: Yes   Plan:   Coumadin 10 mg x 1 today.  Daily PT/INR.  Add heparin bridge?  Aspirin 325 mg daily added here. Decrease to 81 mg at some  point?  Dennie FettersEgan, Bradie Lacock Donovan, RPh Pager: [phone] 07/07/2016,11:37 AM

## 2016-07-08 ENCOUNTER — Observation Stay (HOSPITAL_BASED_OUTPATIENT_CLINIC_OR_DEPARTMENT_OTHER): Payer: Medicare Other

## 2016-07-08 DIAGNOSIS — R799 Abnormal finding of blood chemistry, unspecified: Secondary | ICD-10-CM

## 2016-07-08 DIAGNOSIS — R2990 Unspecified symptoms and signs involving the nervous system: Secondary | ICD-10-CM | POA: Diagnosis not present

## 2016-07-08 DIAGNOSIS — G40909 Epilepsy, unspecified, not intractable, without status epilepticus: Secondary | ICD-10-CM | POA: Diagnosis not present

## 2016-07-08 DIAGNOSIS — G459 Transient cerebral ischemic attack, unspecified: Secondary | ICD-10-CM

## 2016-07-08 DIAGNOSIS — G8191 Hemiplegia, unspecified affecting right dominant side: Secondary | ICD-10-CM | POA: Diagnosis not present

## 2016-07-08 DIAGNOSIS — Z8673 Personal history of transient ischemic attack (TIA), and cerebral infarction without residual deficits: Secondary | ICD-10-CM | POA: Diagnosis not present

## 2016-07-08 DIAGNOSIS — I82599 Chronic embolism and thrombosis of other specified deep vein of unspecified lower extremity: Secondary | ICD-10-CM | POA: Diagnosis not present

## 2016-07-08 DIAGNOSIS — D649 Anemia, unspecified: Secondary | ICD-10-CM

## 2016-07-08 DIAGNOSIS — E118 Type 2 diabetes mellitus with unspecified complications: Secondary | ICD-10-CM | POA: Diagnosis not present

## 2016-07-08 DIAGNOSIS — R791 Abnormal coagulation profile: Secondary | ICD-10-CM

## 2016-07-08 DIAGNOSIS — G894 Chronic pain syndrome: Secondary | ICD-10-CM | POA: Diagnosis not present

## 2016-07-08 DIAGNOSIS — N401 Enlarged prostate with lower urinary tract symptoms: Secondary | ICD-10-CM | POA: Diagnosis not present

## 2016-07-08 LAB — GLUCOSE, CAPILLARY
GLUCOSE-CAPILLARY: 99 mg/dL (ref 65–99)
Glucose-Capillary: 166 mg/dL — ABNORMAL HIGH (ref 65–99)
Glucose-Capillary: 111 mg/dL — ABNORMAL HIGH (ref 65–99)
Glucose-Capillary: 108 mg/dL — ABNORMAL HIGH (ref 65–99)

## 2016-07-08 LAB — HEPARIN LEVEL (UNFRACTIONATED): Heparin Unfractionated: 0.45 IU/mL (ref 0.30–0.70)

## 2016-07-08 LAB — BASIC METABOLIC PANEL
ANION GAP: 6 (ref 5–15)
BUN: 20 mg/dL (ref 6–20)
CHLORIDE: 103 mmol/L (ref 101–111)
CO2: 27 mmol/L (ref 22–32)
Calcium: 9 mg/dL (ref 8.9–10.3)
Creatinine, Ser: 0.76 mg/dL (ref 0.61–1.24)
Glucose, Bld: 99 mg/dL (ref 65–99)
POTASSIUM: 4 mmol/L (ref 3.5–5.1)
SODIUM: 136 mmol/L (ref 135–145)

## 2016-07-08 LAB — CBC
HCT: 34.8 % — ABNORMAL LOW (ref 39.0–52.0)
HEMOGLOBIN: 11.2 g/dL — AB (ref 13.0–17.0)
MCH: 28.4 pg (ref 26.0–34.0)
MCHC: 32.2 g/dL (ref 30.0–36.0)
MCV: 88.3 fL (ref 78.0–100.0)
PLATELETS: 209 10*3/uL (ref 150–400)
RBC: 3.94 MIL/uL — AB (ref 4.22–5.81)
RDW: 13.5 % (ref 11.5–15.5)
WBC: 5.7 10*3/uL (ref 4.0–10.5)

## 2016-07-08 LAB — PROTIME-INR
INR: 1.53
PROTHROMBIN TIME: 18.6 s — AB (ref 11.4–15.2)

## 2016-07-08 MED ORDER — HEPARIN (PORCINE) IN NACL 100-0.45 UNIT/ML-% IJ SOLN
1200.0000 [IU]/h | INTRAMUSCULAR | Status: DC
  Administered 2016-07-08: 1200 [IU]/h via INTRAVENOUS
  Filled 2016-07-08: qty 250

## 2016-07-08 MED ORDER — WARFARIN SODIUM 7.5 MG PO TABS
7.5000 mg | ORAL_TABLET | Freq: Once | ORAL | Status: AC
  Administered 2016-07-08: 7.5 mg via ORAL
  Filled 2016-07-08: qty 1

## 2016-07-08 MED ORDER — ENOXAPARIN SODIUM 40 MG/0.4ML ~~LOC~~ SOLN: 40.0000 mg | SUBCUTANEOUS | Status: DC

## 2016-07-08 MED ORDER — HEPARIN (PORCINE) IN NACL 100-0.45 UNIT/ML-% IJ SOLN
1200.0000 [IU]/h | INTRAMUSCULAR | Status: DC
  Administered 2016-07-08 – 2016-07-09 (×2): 1200 [IU]/h via INTRAVENOUS
  Filled 2016-07-08: qty 250

## 2016-07-08 NOTE — Progress Notes (Addendum)
ANTICOAGULATION CONSULT NOTE - Follow Up Consult  Pharmacy Consult for Coumadin; add Heparin Indication: DVT in right leg 07/02/16  Allergies  Allergen Reactions  . Latex Other (See Comments)    Per MAR  . Macrobid [employer][Nitrofurantoin Monohyd Macro] Other (See Comments)    Per MAR  . Morphine And Related Other (See Comments)    [facility]    Patient Measurements: Height: 6\' 4"  (193 cm) Weight: 177 lb 11.1 oz (80.6 kg) IBW/kg (Calculated) : 86.8  Vital Signs: Temp: 97.5 F (36.4 C) (10/06 0051) Temp Source: Oral (10/06 0051) BP: 134/66 (10/06 0607) Pulse Rate: 66 (10/06 0607)  Labs:  Recent Labs  07/07/16 0156 07/07/16 0157 07/08/16 0618  HGB 12.9* 12.4* 11.2*  HCT 38.0* 38.0* 34.8*  PLT  --  258 209  APTT  --  29  --   LABPROT  --  14.6 18.6*  INR  --  1.14 1.53  CREATININE 0.90 0.97 0.76    Estimated Creatinine Clearance: 100.8 mL/min (by C-G formula based on SCr of 0.76 mg/dL).  Assessment: 69yom on coumadin for recent DVT, admitted with TIA vs stroke. CT head negative for anything acute. INR 1.14 on admission and coumadin continued. Lovenox 80mg  x 1 ordered 10/5 but never given. INR up to 1.53 today after one 10mg  dose. CBC stable. No bleeding.  PTA regimen: 7.5mg  daily   Goal of Therapy:  INR 2-3 Monitor platelets by anticoagulation protocol: Yes   Plan:  1) Coumadin 7.5mg  x 1 2) Daily INR 3) ? Add heparin bridge 4) ? Decrease aspirin to [address]  Gilbert Lewis 07/08/2016,8:55 AM   Addendum: Sherron MondaySpoke to Dr. Lawson RadarUkleja and ok to add heparin bridge but will aim for a lower goal 0.3-0.5 in setting of new stroke.   Plan: 1) Begin heparin at 1200 units/hr 2) 6 hour heparin level 3) Daily heparin level, CBC  Fredrik RiggerMarkle, Gilbert Lewis 07/08/2016, 9:36 AM

## 2016-07-08 NOTE — Progress Notes (Signed)
Lab came back and tried to draw patient's hepirin level, but patient is refusing

## 2016-07-08 NOTE — Progress Notes (Deleted)
Received critical troponin level of 0.03. MD page notified. Will continue to monitor. Lawernce IonYari Shonya Sumida, RN  07/08/2016 9:44 AM

## 2016-07-08 NOTE — Progress Notes (Signed)
Writer called pharmacist to make sure it was safe for the patient to receive the coumadin since patient is refusing his heparin level to be drawn. The pharmacist said it was ok.

## 2016-07-08 NOTE — Progress Notes (Signed)
MD notified again that patient's is still refusing his blood draw for his hepirin level.

## 2016-07-08 NOTE — Evaluation (Signed)
Physical Therapy Evaluation Patient Details Name: Gilbert OpitzBobby Coppinger MRN: [id number] DOB: 15-Oct-1945 Today's Date: 07/08/2016   History of Present Illness  70 yo male admitted with new onset of L side weakness, slurred speech and expressive deficits. PMH  Clinical Impression  Pt admitted with/for L sided weakness and speech difficulties.  Pt currently limited functionally due to the problems listed. ( See problems list.)   Pt will benefit from PT to maximize function and safety in order to get ready for next venue listed below.     Follow Up Recommendations SNF    Equipment Recommendations  None recommended by PT    Recommendations for Other Services       Precautions / Restrictions Precautions Precautions: Fall Precaution Comments: fall / requires hoyer lift for safety      Mobility  Bed Mobility Overal bed mobility: +2 for physical assistance;Needs Assistance Bed Mobility: Supine to Sit     Supine to sit: +2 for physical assistance;Mod assist     General bed mobility comments: pt heavy use of bed rail on L side. Pt helicopter positioning due to spacitiy on R side. Pt with R LE extension and moving body as a full unit  Transfers Overall transfer level: Needs assistance   Transfers: Sit to/from Stand;Squat Pivot Transfers Sit to Stand: +2 physical assistance;Max assist   Squat pivot transfers: +2 physical assistance;Total assist     General transfer comment: pt unable to shift weight or pivot. pt static stnading on L LE and therapist turning patient to chair   Ambulation/Gait             General Gait Details: unable  Stairs            Wheelchair Mobility    Modified Rankin (Stroke Patients Only) Modified Rankin (Stroke Patients Only) Pre-Morbid Rankin Score: No symptoms     Balance     Sitting balance-Leahy Scale: Poor       Standing balance-Leahy Scale: Zero                               Pertinent Vitals/Pain Pain Assessment:  Faces Faces Pain Scale: Hurts even more Pain Location: R leg and otherwise "don't know" Pain Descriptors / Indicators: Grimacing;Moaning Pain Intervention(s): Monitored during session;Repositioned    Home Living Family/patient expects to be discharged to:: Skilled nursing facility                 Additional Comments[facility]    Prior Function Level of Independence: Independent with assistive device(s)   Gait / Transfers Assistance Needed: prior to 3 weeks ago was stand pivot to chair. Pt with two admissions hospital with incr weakness  ADL's / Homemaking Assistance Needed: requires (A) for all adls.   Comments: wife lives at ALF     Hand Dominance   Dominant Hand: Left (due to hemiparesis)    Extremity/Trunk Assessment   Upper Extremity Assessment: Defer to OT evaluation           Lower Extremity Assessment: RLE deficits/detail;LLE deficits/detail RLE Deficits / Details: Whole leg is stiffe with 65* knee flexion attain throughout entire session.  Painful to range.  Right leg also no functional to assist with standing or transfers due to ROM limitations LLE Deficits / Details: generalized weakness overall, with functional AROM  Cervical / Trunk Assessment: Kyphotic  Communication   Communication: Expressive difficulties  Cognition Arousal/Alertness:  Awake/alert Behavior During Therapy: WFL for tasks assessed/performed Overall Cognitive Status: Difficult to assess                 General Comments: family present and answering questions for therapist. pt oriented to self and location as hospital    General Comments General comments (skin integrity, edema, etc.): pt with subluxation previous injury to R shoulder and r digits per daughter. Pt painful with abduction on R shoulder for don doff gown    Exercises     Assessment/Plan    PT Assessment Patient needs continued PT services  PT Problem List  Decreased strength;Decreased range of motion;Decreased activity tolerance;Decreased balance;Decreased mobility;Decreased coordination;Decreased knowledge of use of DME;Decreased knowledge of precautions;Impaired sensation          PT Treatment Interventions DME instruction;Functional mobility training;Therapeutic activities;Therapeutic exercise;Balance training;Patient/family education;Neuromuscular re-education    PT Goals (Current goals can be found in the Care Plan section)  Acute Rehab PT Goals Patient Stated Goal: sit in chair  PT Goal Formulation: With patient/family Time For Goal Achievement: 07/22/16 Potential to Achieve Goals: Good    Frequency Min 3X/week   Barriers to discharge        Co-evaluation PT/OT/SLP Co-Evaluation/Treatment: Yes Reason for Co-Treatment: Complexity of the patient's impairments (multi-system involvement) PT goals addressed during session: Mobility/safety with mobility         End of Session   Activity Tolerance: Patient tolerated treatment well Patient left: in chair;with call bell/phone within reach;with chair alarm set;with family/visitor present Nurse Communication: Mobility status    Functional Assessment Tool Used: clinical judgement Functional Limitation: Mobility: Walking and moving around Mobility: Walking and Moving Around Current Status (Z6109): At least 60 percent but less than 80 percent impaired, limited or restricted Mobility: Walking and Moving Around Goal Status [phone]): At least 40 percent but less than 60 percent impaired, limited or restricted    Time: 0940-1022 PT Time Calculation (min) (ACUTE ONLY): 42 min   Charges:   PT Evaluation $PT Eval Moderate Complexity: 1 Procedure     PT G Codes:   PT G-Codes **NOT FOR INPATIENT CLASS** Functional Assessment Tool Used: clinical judgement Functional Limitation: Mobility: Walking and moving around Mobility: Walking and Moving Around Current Status (U9811): At least 60  percent but less than 80 percent impaired, limited or restricted Mobility: Walking and Moving Around Goal Status [phone]): At least 40 percent but less than 60 percent impaired, limited or restricted    Karlo Goeden, Eliseo Gum 07/08/2016, 3:04 PM 07/08/2016  Nason Bing, PT [phone]  (pager)

## 2016-07-08 NOTE — Progress Notes (Signed)
STROKE TEAM PROGRESS NOTE   HISTORY OF PRESENT ILLNESS (per record) Kendra OpitzBobby Breit is an 70 y.o. male left MCA stroke with right hemiplegia, diabetes mellitus, seizure disorder, hypertension, chronic kidney disease and depression, brought to the emergency room and code stroke status following new onset of not moving left side and unable to speak. Patient was recently hospitalized for suspected DVT. He was on Xarelto the time of admission and was switched to Coumadin with Lovenox bridge. CT scan of his head showed no acute findings. Patient began to respond verbally after obtaining CT scan of his head, and was able to follow commands with use of his left upper extremity, although he was confused. Exam showed marked spasticity of his right upper and lower extremities as well as left lower extremity. Muscle tone was slightly increased in left upper extremity. His overall NIH strokes score was 16, with deficits thought to largely be due to previous stroke (S). INR was 1.14 and APTT was 29. He was LKW at 11:00 PM on 07/06/2016. Patient was not administered IV t-PA secondary to being beyond 3 hour time window for treatment. He was admitted for further evaluation and treatment.   SUBJECTIVE (INTERVAL HISTORY) His daughter is at the bedside.   . Overall he feels his condition is gradually improving. He  is alert and interactive today. He feels his left-sided strength is returning back to baseline. OBJECTIVE Temp:  [97.5 F (36.4 C)-98.6 F (37 C)] 97.5 F (36.4 C) (10/06 0051) Pulse Rate:  [66-80] 66 (10/06 0607) Cardiac Rhythm: Heart block;Bundle branch block (10/06 0700) Resp:  [16] 16 (10/06 0607) BP: (115-138)/(64-67) 134/66 (10/06 0607) SpO2:  [95 %-98 %] 98 % (10/06 0607)  CBC:   Recent Labs Lab 07/07/16 0157 07/08/16 0618  WBC 9.5 5.7  NEUTROABS 5.1  --   HGB 12.4* 11.2*  HCT 38.0* 34.8*  MCV 89.0 88.3  PLT 258 209    Basic Metabolic Panel:   Recent Labs Lab 07/07/16 0157  07/08/16 0618  NA 134* 136  K 4.2 4.0  CL 100* 103  CO2 25 27  GLUCOSE 92 99  BUN 23* 20  CREATININE 0.97 0.76  CALCIUM 9.4 9.0    Lipid Panel:     Component Value Date/Time   CHOL 127 07/01/2016 0338   TRIG 59 07/01/2016 0338   HDL 37 (L) 07/01/2016 0338   CHOLHDL 3.4 07/01/2016 0338   VLDL 12 07/01/2016 0338   LDLCALC 78 07/01/2016 0338   HgbA1c:  Lab Results  Component Value Date   HGBA1C 5.4 07/01/2016   Urine Drug Screen:     Component Value Date/Time   LABOPIA NONE DETECTED 07/07/2016 1020   COCAINSCRNUR NONE DETECTED 07/07/2016 1020   LABBENZ NONE DETECTED 07/07/2016 1020   AMPHETMU NONE DETECTED 07/07/2016 1020   THCU NONE DETECTED 07/07/2016 1020   LABBARB NONE DETECTED 07/07/2016 1020      IMAGING  Ct Head Wo Contrast 07/07/2016 No acute intracranial process. Old moderate LEFT MCA territory infarct. Moderate to severe white matter changes compatible with chronic small vessel ischemic disease.   Ct Angio Head W/cm &/or Wo Cm 07/07/2016 1. No occlusion or high-grade stenosis of the intracranial arteries. 2. Mild atherosclerotic calcification within the proximal V4 segments of the vertebral arteries, with resultant mild-to-moderate stenosis. 3. Left MCA distribution encephalomalacia secondary to remote infarct.   Dg Chest Port 1 View 07/07/2016 Suboptimal inspiration. No definite active process. Mild cardiomegaly.     PHYSICAL EXAM Frail elderly Caucasian male  not in distress. . Afebrile. Head is nontraumatic. Neck is supple without bruit.    Cardiac exam no murmur or gallop. Lungs are clear to auscultation. Distal pulses are well felt. Neurological Exam :  Awake alert oriented 2. Dysarthric speech with nonfluent speech with slight word finding difficulties. Follows commands well. Extraocular movements are full range without nystagmus. Blinks to threat bilaterally. Fundi were not visualized. Right lower facial weakness. Tongue midline. Spastic right  hemiplegia with non-fixed flexion contractures the right elbow, wrist right knee and ankle. Pain on passive range of motion from the right. Antigravity movements on the left but mild left-sided weakness. Patient not cooperative for detailed muscle testing. Deep tendon reflexes are brisk on the right and normal on the left. Right plantar upgoing left equivocal. Gait was not tested.  ASSESSMENT/PLAN [name] is a 70 y.o. male with history of right hemiplegia, diabetes mellitus, seizure disorder, hypertension, chronic kidney disease and depression presenting with L sided weakness and speech difficulty. He did not receive IV t-PA due to being geyond 3 hour time window for treatment.   Possible Stroke/TIA vs recrudescence of prior stroke symptoms  CTA head No significant stenosis   MRI  Consider once stable. Unable to lie flat at this time  Carotid Doppler  pending   2D Echo  pending   LDL 78  HgbA1c 5.4  Warfarin and lovenox for VTE prophylaxis Diet Heart Room service appropriate? Yes; Fluid consistency: Thin  warfarin daily prior to admission, now on warfarin daily and full dose lovenox  Patient counseled to be compliant with his antithrombotic medications  Ongoing aggressive stroke risk factor management  Therapy recommendations:  pending   Disposition:  pending   Hypertension  Stable  Long-term BP goal normotensive  Hyperlipidemia  Home meds:  No statin  LDL 78, goal < 70  Add statin, low dose  Continue statin at discharge  Diabetes  HgbA1c 5.4, goal < 7.0  Controlled  Other Stroke Risk Factors  Advanced age  Hx stroke/TIA  9 years ago  Other Active Problems  R leg DVT on coumadin, Pharmacy managing  CKD  Depression  Spasticity secondary to hx stroke. On baclofen at home. Severe today. Unable to lie flat or tolerate MRI  Chest port CXR. Pt warm to touch. Presented with SOB. Will check.  UA normal  On Keppra at home  Hospital day #  0  SETHI,PRAMOD  [facility] See Amion for Pager information 07/08/2016 2:02 PM  I have personally examined this patient, reviewed notes, independently viewed imaging studies, participated in medical decision making and plan of care.ROS completed by me personally and pertinent positives fully documented  I have made any additions or clarifications directly to the above note. Agree with note above. Patient has a had recurrent DVT despite anticoagulation and presented with new transient left sided weakness probably right hemispheric TIA. CT scan shows no acute abnormality but patient is too uncomfortable at the present time to undergo MRI scan. I had a long discussion the bedside with patient's daughter and Dr Mackie Pai and answered questions. Continue IV heparin or Lovenox bridging until INR is optimal on warfarin. No further stroke workup is necessary. Stroke team will sign off. Greater than 50% time during this 25 minute visit was spent on counseling and coordination of care about his stroke, risk, prevention and treatment   Delia Heady, MD Medical Director [facility] Pager: [phone] 07/08/2016 2:02 PM  To contact Stroke Continuity provider, please  refer to [email or web address] After hours, contact General Neurology

## 2016-07-08 NOTE — Care Management Note (Addendum)
Case Management Note  Patient Details  Name: Gilbert Lewis MRN: [id number] Date of Birth: 1946-05-15  Subjective/Objective:    Pt in with TIA. He is from [facility].  Pt unable currently to tolerate MRI d/t his neck pain.                Action/Plan: Patient daughter would like ST rehab at discharge at a facility that is closer to her and her mother. CSW is aware. CM following for d/c needs.   Expected Discharge Date:                  Expected Discharge Plan:  Skilled Nursing Facility  In-House Referral:  Clinical Social Work  Discharge planning Services  CM Consult  Post Acute Care Choice:    Choice offered to:     DME Arranged:    DME Agency:     HH Arranged:    HH Agency:     Status of Service:  In process, will continue to follow  If discussed at Long Length of Stay Meetings, dates discussed:    Additional Comments:  Gilbert BaloKelli F Jenika Chiem, RN 07/08/2016, 11:21 AM

## 2016-07-08 NOTE — Clinical Social Work Placement (Signed)
   CLINICAL SOCIAL WORK PLACEMENT  NOTE  Date:  07/08/2016  Patient Details  Name: Gilbert OpitzBobby Musgrave MRN: [id number] Date of Birth: 04/13/1946  Clinical Social Work is seeking post-discharge placement for this patient at the Skilled  Nursing Facility level of care (*CSW will initial, date and re-position this form in  chart as items are completed):  Yes   Patient/family provided with [facility] Social Work Department's list of facilities offering this level of care within the geographic area requested by the patient (or if unable, by the patient's family).  Yes   Patient/family informed of their freedom to choose among providers that offer the needed level of care, that participate in Medicare, Medicaid or managed care program needed by the patient, have an available bed and are willing to accept the patient.  Yes   Patient/family informed of [facility] ownership interest in [facility] and [facility], as well as of the fact that they are under no obligation to receive care at these facilities.  PASRR submitted to EDS on       PASRR number received on       Existing PASRR number confirmed on 07/08/16     FL2 transmitted to all facilities in geographic area requested by pt/family on 07/08/16     FL2 transmitted to all facilities within larger geographic area on       Patient informed that his/her managed care company has contracts with or will negotiate with certain facilities, including the following:            Patient/family informed of bed offers received.  Patient chooses bed at       Physician recommends and patient chooses bed at      Patient to be transferred to   on  .  Patient to be transferred to facility by       Patient family notified on   of transfer.  Name of family member notified:        PHYSICIAN       Additional Comment:    _______________________________________________ Dede QuerySarah Arabelle Bollig, LCSW 07/08/2016, 8:03 PM

## 2016-07-08 NOTE — Progress Notes (Signed)
Writer tried to feed patient, but patient refused. Will try again later.

## 2016-07-08 NOTE — Progress Notes (Signed)
PROGRESS NOTE    Gilbert Lewis  QMV:[id number] DOB: [birth year] DOA: 07/07/2016 PCP: Oneal Grout, MD   Brief Narrative:  Gilbert Lewis is a 70 y.o. male with medical history significant of CKD, depression, CVA with residual right-sided weakness, DM, retinopathy, HTN, presenting from nursing home, [name] for evaluation of difficulty speaking and left-sided weakness. Patient last known normal sometime prior to 2300 on 07/06/2016. Nursing home staff noted that patient was having a hard time finding words to speak and had minimal use of left upper and lower extremities. EMS was called and patient brought to [facility] emergency room. 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 Code stroke was initiated when patient arrived to [facility] ED. Some symptoms began to resolve and CT scan initially showed no stroke. Patient is not able to receive an MRI due to spinal surgery and difficulty with getting in the scanner. Currently patient denies any focal complaints other than difficulty with speech, including chest pain, shortness breath, palpitations, neck stiffness, headache, vertigo, dysuria, frequency, fevers. Patient states that his left-sided weakness has resolved but his speech continues to be difficult. Denies any recent seizures.   Assessment & Plan:   Active Problems:   BPH (benign prostatic hyperplasia)   Chronic pain syndrome   DVT (deep venous thrombosis) (HCC)   TIA (transient ischemic attack)   Diabetes mellitus with complication (HCC)   Dysarthria   Seizures (HCC)   Stroke-like symptoms   Dysarthria/R sided weakness:  - TIA vs stroke - Neurology consulted appreciate their recommendations - unable to obtain MRI due to spasticity  - anticipate transfer back to [facility] place for further PT - Echo pending - Carrotid dopplers pending - PT/OT/SLP  Seizures: - no recent reported  seizure - Neuro concerned about partial seizures causing pts symptoms.  - Loading dose Keppra  IV per neuro - Keppra 500 BID thereafter per neuro   Chronic pain:  - back surgery and contractures from stroke - continue oxycodone, baclofen - PT/OT eval  Depression/Anxiety: - continue zoloft, ativan  DVT: failed Xarelto on previous admission - Coumadin with bridge via heparin - will be able to discharge when bed available at SNF and INR therapeutic  BPH: continue proscar, flomax  DM: last A1c 5.4 - pre-dm. - hold metformin - SSI  HLD: - continue actigall  GERD: - continue ppi, carafate  HTN: - allow permissive HTN, hold home BP meds   DVT prophylaxis: coumadin with bridge Code Status: full Family Communication: called patients daughter Jake Samples  Disposition Plan: pending bed availability at SNF and therapeutic INR   Consultants:   Neurology  Procedures:   Carotid ultrasound performed  Echocardiogram pending  Antimicrobials:   none    Subjective: Patient was minimally interactive with me during exam.  Would open eyes but seemed drowsy.  Would not participate in exam except to squeeze my fingers.  Did not speak.  Spoke with patient's daughter whom states that patient is to be discharged to a new SNF at time of discharge as the new facility is closer to her house.    Objective: Vitals:   07/07/16 1800 07/07/16 2152 07/08/16 0051 07/08/16 0607  BP: 115/64 138/67 116/66 134/66  Pulse:  77 68 66  Resp:  Temp:  98.2 F (36.8 C) 97.5 F (36.4 C)   TempSrc:  Oral Oral   SpO2: 95% 98% 96% 98%  Weight:  Height:        Intake/Output Summary (Last 24 hours) at 07/08/16 1354 Last data filed at 07/08/16 0608  Gross per 24 hour  Intake          1438.75 ml  Output              650 ml  Net           788.75 ml   Filed Weights   07/07/16 0100 07/07/16 0216  Weight: 80.6 kg (177 lb 11.1 oz) 80.6 kg (177 lb 11.1 oz)     Examination:  General exam: Appears calm and comfortable  Respiratory system: Clear to auscultation. Respiratory effort normal. Cardiovascular system: S1 & S2 heard, RRR. No JVD, murmurs, rubs, gallops or clicks. No pedal edema. Gastrointestinal system: Abdomen is nondistended, soft and nontender. No organomegaly or masses felt. Normal bowel sounds heard. Central nervous system: unable to assess due to lack of participation by patient. Extremities: unable to assess, contracture of the right upper extremity  Skin: No rashes, lesions or ulcers Psychiatry: unable to assess.     Data Reviewed: I have personally reviewed following labs and imaging studies  CBC:  Recent Labs Lab 07/02/16 0508 07/03/16 0243 07/07/16 0156 07/07/16 0157 07/08/16 0618  WBC 5.9 6.7  --  9.5 5.7  NEUTROABS  --   --   --  5.1  --   HGB 11.1* 11.3* 12.9* 12.4* 11.2*  HCT 33.6* 34.5* 38.0* 38.0* 34.8*  MCV 86.4 87.8  --  89.0 88.3  PLT 229 232  --  258 209   Basic Metabolic Panel:  Recent Labs Lab 07/07/16 0156 07/07/16 0157 07/08/16 0618  NA 137 134* 136  K 4.2 4.2 4.0  CL 101 100* 103  CO2  --  25 27  GLUCOSE 86 92 99  BUN 26* 23* 20  CREATININE 0.90 0.97 0.76  CALCIUM  --  9.4 9.0   GFR: Estimated Creatinine Clearance: 100.8 mL/min (by C-G formula based on SCr of 0.76 mg/dL). Liver Function Tests:  Recent Labs Lab 07/07/16 0157  AST 18  ALT 17  ALKPHOS 140*  BILITOT 0.6  PROT 6.8  ALBUMIN 3.8   No results for input(s): LIPASE, AMYLASE in the last 168 hours. No results for input(s): AMMONIA in the last 168 hours. Coagulation Profile:  Recent Labs Lab 07/02/16 0508 07/03/16 0243 07/07/16 0157 07/08/16 0618  INR 1.14 1.42 1.14 1.53   Cardiac Enzymes: No results for input(s): CKTOTAL, CKMB, CKMBINDEX, TROPONINI in the last 168 hours. BNP (last 3 results) No results for input(s): PROBNP in the last 8760 hours. HbA1C: No results for input(s): HGBA1C in the last 72  hours. CBG:  Recent Labs Lab 07/07/16 0150 07/07/16 1700 07/07/16 2151 07/08/16 0739 07/08/16 1146  GLUCAP 92 132* 169* 99 166*   Lipid Profile: No results for input(s): CHOL, HDL, LDLCALC, TRIG, CHOLHDL, LDLDIRECT in the last 72 hours. Thyroid Function Tests: No results for input(s): TSH, T4TOTAL, FREET4, T3FREE, THYROIDAB in the last 72 hours. Anemia Panel: No results for input(s): VITAMINB12, FOLATE, FERRITIN, TIBC, IRON, RETICCTPCT in the last 72 hours. Sepsis Labs: No results for input(s): PROCALCITON, LATICACIDVEN in the last 168 hours.  Recent Results (from the past 240 hour(s))  MRSA PCR Screening     Status: None   Collection Time: 06/30/16 10:26 PM  Result Value Ref Range Status   MRSA by PCR NEGATIVE NEGATIVE Final    Comment:        The GeneXpert  MRSA Assay (FDA approved for NASAL specimens only), is one component of a comprehensive MRSA colonization surveillance program. It is not intended to diagnose MRSA infection nor to guide or monitor treatment for MRSA infections.          Radiology Studies: Ct Angio Head W/cm &/or Wo Cm  Result Date: 07/07/2016 CLINICAL DATA:  Left-sided weakness.  History of old stroke. EXAM: CT ANGIOGRAPHY HEAD TECHNIQUE: Multidetector CT imaging of the head was performed using the standard protocol during bolus administration of intravenous contrast. Multiplanar CT image reconstructions and MIPs were obtained to evaluate the vascular anatomy. CONTRAST:  50 mL Isovue 370 IV COMPARISON:  Head CT 07/07/2016 FINDINGS: CTA HEAD Hypoattenuation is again seen within the left MCA territory, reflecting remote infarct. Anterior circulation: --Intracranial internal carotid arteries: Normal. --Anterior cerebral arteries: Normal. --Middle cerebral arteries: Normal. --Posterior communicating arteries: Present on the right. Not seen on the left. Posterior circulation: --Posterior cerebral arteries: Normal. --Superior cerebellar arteries: Normal.  --Basilar artery: Normal. --Anterior inferior cerebellar arteries: Not clearly visualize, which is not uncommon. --Posterior inferior cerebellar arteries: Normal. --Vertebral arteries: Right-dominant. The left vertebral artery is hypoplastic and terminates in PICA. There is chunky atherosclerotic calcification at the proximal left V4 segment resulting in at least moderate stenosis. Calcification in the proximal right V4 segment causes at least mild stenosis. Venous sinuses: As permitted by contrast timing, patent. Anatomic variants: Fetal predominant origin of the right posterior cerebral artery. Delayed phase: No parenchymal contrast enhancement. IMPRESSION: 1. No occlusion or high-grade stenosis of the intracranial arteries. 2. Mild atherosclerotic calcification within the proximal V4 segments of the vertebral arteries, with resultant mild-to-moderate stenosis. 3. Left MCA distribution encephalomalacia secondary to remote infarct. Electronically Signed   By: Deatra Robinson M.D.   On: 07/07/2016 13:45   Ct Head Wo Contrast  Result Date: 07/07/2016 CLINICAL DATA:  LEFT-sided weakness, last seen normal at 2300 hours. History of hypertension, diabetes and stroke. EXAM: CT HEAD WITHOUT CONTRAST TECHNIQUE: Contiguous axial images were obtained from the base of the skull through the vertex without intravenous contrast. COMPARISON:  None. FINDINGS: BRAIN: No intraparenchymal hemorrhage, mass effect, midline shift or acute large vascular territory infarcts. LEFT frontotemporal/insular encephalomalacia. Old LEFT basal ganglia and thalamus infarcts with ex vacuo dilatation LEFT lateral ventricle. No hydrocephalus. LEFT basal ganglia mineralization. Patchy to confluent supratentorial white matter hypodensities. Diminutive LEFT cerebral peduncle compatible with wallerian degeneration. No abnormal extra-axial fluid collections. Basal cisterns are patent. VASCULAR: Mild calcific atherosclerosis the carotid siphons. No dense  MCA. SKULL: No skull fracture. No significant scalp soft tissue swelling. SINUSES/ORBITS: The mastoid air-cells and included paranasal sinuses are well-aerated.The included ocular globes and orbital contents are non-suspicious. OTHER: None. IMPRESSION: No acute intracranial process. Old moderate LEFT MCA territory infarct. Moderate to severe white matter changes compatible with chronic small vessel ischemic disease. Acute findings discussed with and reconfirmed by Dr.Stewart, Neurology on 07/07/2016 at 2:19 am. Electronically Signed   By: Awilda Metro M.D.   On: 07/07/2016 02:20   Dg Chest Port 1 View  Result Date: 07/07/2016 CLINICAL DATA:  Shortness of breath, weakness, fatigue EXAM: PORTABLE CHEST 1 VIEW COMPARISON:  CT chest of 06/30/2016 FINDINGS: No active infiltrate or effusion is seen. The lungs are not optimally aerated. Mediastinal and hilar contours are unremarkable. The heart is borderline enlarged. There are degenerative changes throughout the thoracic spine. IMPRESSION: Suboptimal inspiration. No definite active process. Mild cardiomegaly. Electronically Signed   By: Dwyane Dee M.D.   On: 07/07/2016 13:57  Dg Swallowing Func-speech Pathology  Result Date: 07/07/2016 Objective Swallowing Evaluation: Type of Study: MBS-Modified Barium Swallow Study Patient Details Name: Daiel Strohecker MRN: [id number] Date of Birth: 22-Jun-1946 Today's Date: 07/07/2016 Time: SLP Start Time (ACUTE ONLY): 1147-SLP Stop Time (ACUTE ONLY): 1206 SLP Time Calculation (min) (ACUTE ONLY): 19 min Past Medical History: Past Medical History: Diagnosis Date . Cataract  . CKD (chronic kidney disease)  . Depression  . Diabetes mellitus without complication (HCC)  . Diabetic retinopathy (HCC)  . Hypertension  . Stroke [facility])  Past Surgical History: No past surgical history on file. HPI: 70 y.o.malewith medical history significant of CKD, depression, CVA with residual right-sided weakness, DM, retinopathy, HTN, presenting with  difficulty speaking and left-sided weakness. Head CT showed old moderate LEFT MCA territory infarct and no acute infarct. Pt failed SSS due to h/o dysphagia. Subjective: pt alert and cooperative Assessment / Plan / Recommendation CHL IP CLINICAL IMPRESSIONS 07/07/2016 Therapy Diagnosis Mild pharyngeal phase dysphagia Clinical Impression Pt presents with mild pharyngeal dysphagia. He displayed no events of aspiration or penetration. Pt had a mild delay in swallow initiation to the valleculae for pureed and regular solids. Following all PO trials there was trace vallecular residue observed, and UES residue was seen with thin liquid trials. Recommend regular diet and thin liquids. Given pt's performance, h/o stroke, and anatomical differences (dx of DISH per care everywhere), SLP will s/o given pt's swallow function is likely at baseline. Impact on safety and function Mild aspiration risk   CHL IP TREATMENT RECOMMENDATION 07/07/2016 Treatment Recommendations No treatment recommended at this time   Prognosis 07/07/2016 Prognosis for Safe Diet Advancement Good Barriers to Reach Goals Cognitive deficits Barriers/Prognosis Comment -- CHL IP DIET RECOMMENDATION 07/07/2016 SLP Diet Recommendations Regular solids;Thin liquid Liquid Administration via Cup;Straw Medication Administration Whole meds with liquid Compensations Minimize environmental distractions;Slow rate;Small sips/bites Postural Changes Remain semi-upright after after feeds/meals (Comment);Seated upright at 90 degrees   CHL IP OTHER RECOMMENDATIONS 07/07/2016 Recommended Consults -- Oral Care Recommendations Oral care BID Other Recommendations --   CHL IP FOLLOW UP RECOMMENDATIONS 07/07/2016 Follow up Recommendations Skilled Nursing facility;24 hour supervision/assistance   CHL IP FREQUENCY AND DURATION 07/07/2016 Speech Therapy Frequency (ACUTE ONLY) min 2x/week Treatment Duration --      CHL IP ORAL PHASE 07/07/2016 Oral Phase WFL Oral - Pudding Teaspoon -- Oral -  Pudding Cup -- Oral - Honey Teaspoon -- Oral - Honey Cup -- Oral - Nectar Teaspoon -- Oral - Nectar Cup -- Oral - Nectar Straw -- Oral - Thin Teaspoon -- Oral - Thin Cup -- Oral - Thin Straw -- Oral - Puree -- Oral - Mech Soft -- Oral - Regular -- Oral - Multi-Consistency -- Oral - Pill -- Oral Phase - Comment --  CHL IP PHARYNGEAL PHASE 07/07/2016 Pharyngeal Phase Impaired Pharyngeal- Pudding Teaspoon -- Pharyngeal -- Pharyngeal- Pudding Cup -- Pharyngeal -- Pharyngeal- Honey Teaspoon -- Pharyngeal -- Pharyngeal- Honey Cup -- Pharyngeal -- Pharyngeal- Nectar Teaspoon -- Pharyngeal -- Pharyngeal- Nectar Cup -- Pharyngeal -- Pharyngeal- Nectar Straw -- Pharyngeal -- Pharyngeal- Thin Teaspoon -- Pharyngeal -- Pharyngeal- Thin Cup Pharyngeal residue - valleculae;Pharyngeal residue - cp segment Pharyngeal -- Pharyngeal- Thin Straw Pharyngeal residue - valleculae Pharyngeal -- Pharyngeal- Puree Delayed swallow initiation-vallecula;Pharyngeal residue - valleculae Pharyngeal -- Pharyngeal- Mechanical Soft -- Pharyngeal -- Pharyngeal- Regular Delayed swallow initiation-vallecula;Pharyngeal residue - valleculae Pharyngeal -- Pharyngeal- Multi-consistency -- Pharyngeal -- Pharyngeal- Pill WFL Pharyngeal -- Pharyngeal Comment --  CHL IP CERVICAL ESOPHAGEAL PHASE 07/07/2016 Cervical Esophageal Phase  Impaired Pudding Teaspoon -- Pudding Cup -- Honey Teaspoon -- Honey Cup -- Nectar Teaspoon -- Nectar Cup -- Nectar Straw -- Thin Teaspoon -- Thin Cup Reduced cricopharyngeal relaxation Thin Straw Reduced cricopharyngeal relaxation Puree Reduced cricopharyngeal relaxation Mechanical Soft -- Regular Reduced cricopharyngeal relaxation Multi-consistency -- Pill Reduced cricopharyngeal relaxation Cervical Esophageal Comment -- CHL IP GO 07/07/2016 Functional Assessment Tool Used skilled clinical judgment Functional Limitations Swallowing Swallow Current Status (R6045) CI Swallow Goal Status (W0981) CI Swallow Discharge Status (X9147) CI  Motor Speech Current Status (W2956) (None) Motor Speech Goal Status (O1308) (None) Motor Speech Goal Status (M5784) (None) Spoken Language Comprehension Current Status (O9629) (None) Spoken Language Comprehension Goal Status (B2841) (None) Spoken Language Comprehension Discharge Status (L2440) (None) Spoken Language Expression Current Status (N0272) (None) Spoken Language Expression Goal Status (Z3664) (None) Spoken Language Expression Discharge Status [phone]) (None) Attention Current Status (Q2595) (None) Attention Goal Status (G3875) (None) Attention Discharge Status (I4332) (None) Memory Current Status (R5188) (None) Memory Goal Status (C1660) (None) Memory Discharge Status (Y3016) (None) Voice Current Status (W1093) (None) Voice Goal Status (A3557) (None) Voice Discharge Status (D2202) (None) Other Speech-Language Pathology Functional Limitation [phone]) (None) Other Speech-Language Pathology Functional Limitation Goal Status (W2376) (None) Other Speech-Language Pathology Functional Limitation Discharge Status [phone]) (None) Maxcine Ham 07/07/2016, 12:54 PM  Note populated for Jearl Klinefelter, student SLP Maxcine Ham, M.A. CCC-SLP [phone]                  Scheduled Meds: .  stroke: mapping our early stages of recovery book   Does not apply Once  . aspirin  300 mg Rectal Daily   Or  . aspirin  325 mg Oral Daily  . baclofen  10 mg Oral Once  . baclofen  10 mg Oral TID  . feeding supplement (PRO-STAT SUGAR FREE 64)  30 mL Oral BID  . ferrous sulfate  325 mg Oral Q breakfast  . finasteride  5 mg Oral QPM  . insulin aspart  0-5 Units Subcutaneous QHS  . insulin aspart  0-9 Units Subcutaneous TID WC  . levETIRAcetam  500 mg Intravenous Q12H  . LORazepam  0.25 mg Oral QHS  . Melatonin  3 tablet Oral QHS  . pantoprazole  40 mg Oral Daily  . polyethylene glycol  17 g Oral Daily  . senna  2 tablet Oral BID  . sertraline  150 mg Oral Daily  . sucralfate  1 g Oral TID WC & HS  .  tamsulosin  0.4 mg Oral QPC supper  . ursodiol  600 mg Oral BID  . warfarin  7.5 mg Oral ONCE-1800  . Warfarin - Pharmacist Dosing Inpatient   Does not apply q1800   Continuous Infusions: . sodium chloride 75 mL/hr at 07/08/16 0201  . heparin 1,200 Units/hr (07/08/16 1031)     LOS: 0 days    Time spent: 45 minutes    Bennett Scrape, MD [facility] Pager [phone]  If 7PM-7AM, please contact night-coverage [email or web address] Password [email or web address] 07/08/2016, 1:54 PM

## 2016-07-08 NOTE — Progress Notes (Signed)
Writer tried again to feed patient, but patient still refusing to eat.

## 2016-07-08 NOTE — Evaluation (Signed)
Occupational Therapy Evaluation Patient Details Name: Gilbert OpitzBobby Lewis MRN: [id number] DOB: [birth year] Today's Date: 07/08/2016    History of Present Illness 70 yo male admitted with new onset of L side weakness, slurred speech and expressive deficits. PMH            Clinical Impression  Past Medical History:  Diagnosis Date  . Cataract   . CKD (chronic kidney disease)   . Depression   . Diabetes mellitus without complication (HCC)   . Diabetic retinopathy (HCC)   . Hypertension   . Stroke [facility](HCC)   '  PT admitted with workup underway for CVA and currently with speech deficits present. Pt currently with functional limitiations due to the deficits listed below (see OT problem list). P[facility] ( x3 weeks) previously facility in [place]. Pt was stand pivoting to w/c prior to recent decline with transfer to new facility. Family very much wants patient with OT / PT / SLP working at maximum provided therapy. Pt with risk for skin break down in R hand and could benefit from close monitoring.  Pt will benefit from skilled OT to increase their independence and safety with adls and balance to allow discharge SNF. ( [facility] )     Follow Up Recommendations  SNF;Supervision/Assistance - 24 hour    Equipment Recommendations  3 in 1 bedside comode;Wheelchair (measurements OT);Wheelchair cushion (measurements OT);Hospital bed;Other (comment) (OT EVALUATION )    Recommendations for Other Services       Precautions / Restrictions Precautions Precautions: Fall Precaution Comments: fall / requires hoyer lift for safety Restrictions Weight Bearing Restrictions: No      Mobility Bed Mobility Overal bed mobility: +2 for physical assistance;Needs Assistance Bed Mobility: Supine to Sit     Supine to sit: +2 for physical assistance;Mod assist     General bed mobility comments: pt heavy use of bed rail on L side. Pt  helicopter positioning due to spacitiy on R side. Pt with R LE extension and moving body as a full unit  Transfers Overall transfer level: Needs assistance   Transfers: Sit to/from Stand;Squat Pivot Transfers Sit to Stand: +2 physical assistance;Max assist   Squat pivot transfers: +2 physical assistance;Total assist     General transfer comment: pt unable to shift weight or pivot. pt static stnading on L LE and therapist turning patient to chair     Balance Overall balance assessment: Needs assistance Sitting-balance support: Single extremity supported;Feet supported Sitting balance-Leahy Scale: Poor       Standing balance-Leahy Scale: Zero                              ADL Overall ADL's : Needs assistance/impaired Eating/Feeding: Maximal assistance   Grooming: Maximal assistance   Upper Body Bathing: Maximal assistance   Lower Body Bathing: Total assistance           Toilet Transfer: +2 for physical assistance;Maximal assistance Toilet Transfer Details (indicate cue type and reason): pt able to anterior weight shift but unable to step. pt requires total (A) total +2 to swing hips to chair           General ADL Comments: Pt will require hoyer lift only with RN staff. Recommend oob x2 hours max and return to supine due to risk for skin break down with prolonged positioning. pt should OOB th chair x2 per day.  Vision     Perception     Praxis      Pertinent Vitals/Pain Pain Assessment: Faces Faces Pain Scale: Hurts even more Pain Location: pt states "dont know" when asked. Pt unable to express Pain Descriptors / Indicators: Grimacing Pain Intervention(s): Repositioned;Premedicated before session;Monitored during session     Hand Dominance Left (due to hemiparesis)   Extremity/Trunk Assessment Upper Extremity Assessment Upper Extremity Assessment: RUE deficits/detail RUE Deficits / Details: spasticity, cortical thumb, finger flexion, risk  for skin break down in palm Palm guard provided this session. Recommend Carrot palm guard at next venue RUE: Unable to fully assess due to pain RUE Sensation: decreased light touch RUE Coordination: decreased fine motor;decreased gross motor   Lower Extremity Assessment Lower Extremity Assessment: Defer to PT evaluation;RLE deficits/detail   Cervical / Trunk Assessment Cervical / Trunk Assessment: Kyphotic   Communication Communication Communication: Expressive difficulties   Cognition Arousal/Alertness: Awake/alert Behavior During Therapy: WFL for tasks assessed/performed Overall Cognitive Status: Difficult to assess                 General Comments: family present and answering questions for therapist. pT oriented to self and location as hospital   General Comments       Exercises       Shoulder Instructions      Home Living Family/patient expects to be discharged to:: Skilled nursing facility                                 Additional Comments: [facility] with [facility]      Prior Functioning/Environment Level of Independence: Independent with assistive device(s)  Gait / Transfers Assistance Needed: prior to 3 weeks ago was stand pivot to chair. Pt with two admissions hospital with incr weakness ADL's / Homemaking Assistance Needed: requires (A) for all adls.  Communication / Swallowing Assistance Needed: (A) recently for self feeding but wasnt prior to 3 weeks ago Comments: wife lives at ALF        OT Problem List: Decreased strength;Decreased range of motion;Decreased activity tolerance;Impaired balance (sitting and/or standing);Decreased coordination;Decreased cognition;Decreased knowledge of use of DME or AE;Decreased knowledge of precautions;Decreased safety awareness;Cardiopulmonary status limiting activity;Impaired sensation;Impaired UE functional use;Pain;Increased edema   OT Treatment/Interventions:  Self-care/ADL training;Therapeutic exercise;Neuromuscular education;DME and/or AE instruction;Therapeutic activities;Cognitive remediation/compensation;Patient/family education;Balance training;Visual/perceptual remediation/compensation    OT Goals(Current goals can be found in the care plan section) Acute Rehab OT Goals Patient Stated Goal: sit in chair  OT Goal Formulation: With patient/family Time For Goal Achievement: 07/22/16 Potential to Achieve Goals: Good  OT Frequency: Min 2X/week   Barriers to D/C:            Co-evaluation PT/OT/SLP Co-Evaluation/Treatment: Yes Reason for Co-Treatment: Complexity of the patient's impairments (multi-system involvement);For patient/therapist safety   OT goals addressed during session: ADL's and self-care;Strengthening/ROM      End of Session Nurse Communication: Mobility status;Need for lift equipment;Precautions  Activity Tolerance: Patient tolerated treatment well Patient left: in chair;with call bell/phone within reach;with chair alarm set;with nursing/sitter in room;with family/visitor present   Time: 4540-9811 OT Time Calculation (min): 42 min Charges:  OT General Charges $OT Visit: 1 Procedure OT Evaluation $OT Eval Moderate Complexity: 1 Procedure G-Codes: OT G-codes **NOT FOR INPATIENT CLASS** Functional Assessment Tool Used: clinical judgement Functional Limitation: Self care Self Care Current Status (B1478): At least 80 percent but less than 100 percent impaired, limited or restricted  Self Care Goal Status [phone]): At least 40 percent but less than 60 percent impaired, limited or restricted  Boone Master B 07/08/2016, 10:34 AM   Mateo Flow   OTR/L Pager: [phone] Office: [phone] .

## 2016-07-08 NOTE — Progress Notes (Signed)
Patient refused lab tech to draw hepirin level. Family called to see if they can talk patient into it, but patient still refused. MD notified

## 2016-07-08 NOTE — Care Management Obs Status (Signed)
MEDICARE OBSERVATION STATUS NOTIFICATION   Patient Details  Name: Gilbert Lewis MRN: [id number] Date of Birth: [birth year]   Medicare Observation Status Notification Given:  Other (see comment) (CM explained MOON letter to pt who has severe expressive aphasia therefore unable to verbally confirm understanding utilizing teachback method. CM attempted to call spouse at [phone] and [phone] without answer.) Cm left copy at the bedside and will attempt to reach spouse at another time to explained MOON letter.     Darcel SmallingAnna C Shunsuke Granzow, RN 07/08/2016, 8:33 PM

## 2016-07-08 NOTE — Progress Notes (Addendum)
ANTICOAGULATION CONSULT NOTE - Follow Up Consult  Pharmacy Consult for:   Switch from IV heparin to SQ lovenox (VTE prophylaxis dosing due to possible TIA vs CVA) bridge to Coumadin   See ADDENDUM:  Cancel lovenox.  Continue IV heparin (07/08/2016  8:28 PM) Indication: DVT in right leg 07/02/16  Allergies  Allergen Reactions  . Latex Other (See Comments)    Per MAR  . Macrobid [employer][Nitrofurantoin Monohyd Macro] Other (See Comments)    Per MAR  . Morphine And Related Other (See Comments)    [facility]    Patient Measurements: Height: 6\' 4"  (193 cm) Weight: 177 lb 11.1 oz (80.6 kg) IBW/kg (Calculated) : 86.8  Vital Signs: Temp: 98.2 F (36.8 C) (10/06 1837) Temp Source: Oral (10/06 1837) BP: 146/74 (10/06 1837) Pulse Rate: 64 (10/06 1837)  Labs:  Recent Labs  07/07/16 0156 07/07/16 0157 07/08/16 0618  HGB 12.9* 12.4* 11.2*  HCT 38.0* 38.0* 34.8*  PLT  --  258 209  APTT  --  29  --   LABPROT  --  14.6 18.6*  INR  --  1.14 1.53  CREATININE 0.90 0.97 0.76    Estimated Creatinine Clearance: 100.8 mL/min (by C-G formula based on SCr of 0.76 mg/dL).  Assessment: 70 y.o male on IV heparin bridged to coumadin for recent DVT in right leg 07/02/16. Pharmacy consulted tonight to switch from IV heparin to SQ lovenox due to patient is refusing lab draws. CBC stable. No bleeding. Noted that he was recently hospitalized 9/28-10/1 and was previously on Xarelto prior to the 06/30/16 admission, but developed DVT while on Xarelto so changed to coumadin on 07/01/16. Bridged on that admission with IV heparin 9/28-10/1 then switched to lovenox 120mg  SQ q24h on 07/03/16.  I discussed lovenox dosing with Dr. Lawson RadarUkleja, that full dose lovenox dosing not recommended for pt with TIA vs  CVA. Preferred agent is low dose IV heparin infusion. Or give VTE prophylaxis Lovenox as noted per Dr. Pearlean BrownieSethi, neuro consult today. Dr. Lawson RadarUkleja wants to use lovenox VTE prophylaxis dosing.   Coumadin PTA regimen: 7.5mg  daily ,  last taken PTA on 07/06/16, Admit INR 1.14  Goal of Therapy:  INR 2-3 Monitor platelets by anticoagulation protocol: Yes   Plan:  Discontinue IV heparin drip now if not already done and 1 hour later give Lovenox.  Lovenox 40 mg SQ q24h VTE prophylaxis dose until INR =/>2 Continue coumadin as previously ordered.   Thank you for allowing pharmacy to be part of this patients care team. Gilbert Lewis, RPh Clinical Pharmacist Pager: [phone] 07/08/2016,6:58 PM    ADDENDUM:  Cancel lovenox.  Continue IV heparin (07/08/2016  8:28 PM) After Lovenox orders placed and discussed with RN a heparin level resulted for Gilbert Lewis.  Heparin level = 0.45, therapeutic on 1200 units/hr IV heparin. The RN reports heparin drip has been stopped but not for very long.  RN spoke to the patient and the patient agrees to have labs drawn and is agreeable to continue the IV heparin.  IV heparin infusion with  lower range heparin level goal of 0.3-0.5 units/ml, bridge to coumadin for recent DVT is preferred for this patient due to possible TIA vs CVA this admit.  Discussed this with [facility] on call, M. Burnadette PeterLynch, NP who agrees to continue IV heparin per pharmacy consult.   Goal:  Heparin level 0.3-0.5 units/hr INR = 2-3 Monitor platelets by anticoagulation protocol: Yes  Plan:  Discontinue lovenox (not given) Restart IV heparin drip  at previous rate 1200 units/hr Heparin level , INR and CBC daily. Monitor for s/sx of bleeding  Gilbert Lewis, RPh Clinical Pharmacist Pager: [phone] 07/08/2016 8:35 PM

## 2016-07-08 NOTE — Clinical Social Work Note (Signed)
Clinical Social Work Assessment  Patient Details  Name: Gilbert Lewis MRN: [id number] Date of Birth: 1946-03-05  Date of referral:  07/08/16               Reason for consult:  Facility Placement                Permission sought to share information with:  Family Supports Permission granted to share information::  Yes, Verbal Permission Granted  Name::     Serena Colonel  Relationship::  daughter  Contact Information:  [phone]  Housing/Transportation Living arrangements for the past 2 months:  Independence of Information:  Adult Children Patient Interpreter Needed:  None Criminal Activity/Legal Involvement Pertinent to Current Situation/Hospitalization:  No - Comment as needed Significant Relationships:  Adult Children, Spouse Lives with:  Facility Resident Do you feel safe going back to the place where you live?  Yes Need for family participation in patient care:  Yes (Comment)  Care giving concerns:  No care giving concerns identified.   Social Worker assessment / plan:  CSW met with pt's daughter to address consult as pt was admitted from [facility]. CSW introduced herself and explained role of social work. CSW introduced herself and explained role of social work. CSW also explained the process of discharging to SNF. Per pt's daughter, pt is LTC at [facility], and she would like for pt to go to [facility] for [place]. However, pt's daughter did express an interested in STR at either [facility] or [facility]. CSW explained the pt must have Medicare days available for pt to received PT/OT/SLP and paid from by Medicare, and pt's daughter was unable to verify this. CSW shared that she will initiate a SNF search and contact facilitied to inquire about Medicare days available. Pt is able to return to [facility] if needed. CSW will continue to follow.   Employment status:  Retired Nurse, adult PT Recommendations:  [place] / Referral to community resources:  [place]  Patient/Family's Response to care:  Pt's daughter was Patent attorney of CSW support.   Patient/Family's Understanding of and Emotional Response to Diagnosis, Current Treatment, and Prognosis:  Pt's daughter would like rehab for pt.   Emotional Assessment Appearance:  Appears stated age Attitude/Demeanor/Rapport:   (Appropriate) Affect (typically observed):  Pleasant Orientation:  Oriented to Self, Oriented to Place Alcohol / Substance use:  Never Used Psych involvement (Current and /or in the community):  No (Comment)  Discharge Needs  Concerns to be addressed:  Adjustment to Illness Readmission within the last 30 days:  Yes Current discharge risk:  Chronically ill Barriers to Discharge:  Continued Medical Work up   [employer], LCSW 07/08/2016, 8:05 PM

## 2016-07-09 ENCOUNTER — Other Ambulatory Visit (HOSPITAL_COMMUNITY): Payer: Medicare Other

## 2016-07-09 DIAGNOSIS — R471 Dysarthria and anarthria: Secondary | ICD-10-CM | POA: Diagnosis not present

## 2016-07-09 DIAGNOSIS — R799 Abnormal finding of blood chemistry, unspecified: Secondary | ICD-10-CM | POA: Diagnosis not present

## 2016-07-09 DIAGNOSIS — E1122 Type 2 diabetes mellitus with diabetic chronic kidney disease: Secondary | ICD-10-CM

## 2016-07-09 DIAGNOSIS — N183 Chronic kidney disease, stage 3 (moderate): Secondary | ICD-10-CM

## 2016-07-09 DIAGNOSIS — G894 Chronic pain syndrome: Secondary | ICD-10-CM | POA: Diagnosis not present

## 2016-07-09 DIAGNOSIS — I82599 Chronic embolism and thrombosis of other specified deep vein of unspecified lower extremity: Secondary | ICD-10-CM | POA: Diagnosis not present

## 2016-07-09 DIAGNOSIS — E118 Type 2 diabetes mellitus with unspecified complications: Secondary | ICD-10-CM | POA: Diagnosis not present

## 2016-07-09 DIAGNOSIS — N401 Enlarged prostate with lower urinary tract symptoms: Secondary | ICD-10-CM | POA: Diagnosis not present

## 2016-07-09 LAB — GLUCOSE, CAPILLARY
GLUCOSE-CAPILLARY: 102 mg/dL — AB (ref 65–99)
Glucose-Capillary: 110 mg/dL — ABNORMAL HIGH (ref 65–99)
Glucose-Capillary: 149 mg/dL — ABNORMAL HIGH (ref 65–99)

## 2016-07-09 LAB — VAS US CAROTID
LCCAPSYS: 144 cm/s
LEFT ECA DIAS: -17 cm/s
LEFT VERTEBRAL DIAS: 6 cm/s
LICADDIAS: -13 cm/s
LICAPSYS: -73 cm/s
Left CCA dist dias: -12 cm/s
Left CCA dist sys: -127 cm/s
Left CCA prox dias: 10 cm/s
Left ICA dist sys: -73 cm/s
Left ICA prox dias: -8 cm/s
RIGHT ECA DIAS: -12 cm/s
RIGHT VERTEBRAL DIAS: 7 cm/s
Right CCA prox dias: 13 cm/s
Right CCA prox sys: 122 cm/s
Right cca dist sys: -65 cm/s

## 2016-07-09 LAB — CBC
HEMATOCRIT: 34.3 % — AB (ref 39.0–52.0)
Hemoglobin: 10.8 g/dL — ABNORMAL LOW (ref 13.0–17.0)
MCH: 27.8 pg (ref 26.0–34.0)
MCHC: 31.5 g/dL (ref 30.0–36.0)
MCV: 88.2 fL (ref 78.0–100.0)
PLATELETS: 212 10*3/uL (ref 150–400)
RBC: 3.89 MIL/uL — AB (ref 4.22–5.81)
RDW: 13.5 % (ref 11.5–15.5)
WBC: 6.4 10*3/uL (ref 4.0–10.5)

## 2016-07-09 LAB — HEPARIN LEVEL (UNFRACTIONATED): HEPARIN UNFRACTIONATED: 0.47 [IU]/mL (ref 0.30–0.70)

## 2016-07-09 MED ORDER — HEPARIN (PORCINE) IN NACL 100-0.45 UNIT/ML-% IJ SOLN: 12.0000 [IU]/kg/h | INTRAMUSCULAR | Status: DC

## 2016-07-09 MED ORDER — ATORVASTATIN CALCIUM 10 MG PO TABS: 10.0000 mg | ORAL_TABLET | Freq: Every day | ORAL | 0 refills | Status: AC

## 2016-07-09 MED ORDER — ENOXAPARIN SODIUM 40 MG/0.4ML ~~LOC~~ SOLN
40.0000 mg | SUBCUTANEOUS | Status: DC
  Administered 2016-07-09: 40 mg via SUBCUTANEOUS
  Filled 2016-07-09: qty 0.4

## 2016-07-09 MED ORDER — ASPIRIN 325 MG PO TABS: 325.0000 mg | ORAL_TABLET | Freq: Every day | ORAL | Status: DC

## 2016-07-09 MED ORDER — ATORVASTATIN CALCIUM 10 MG PO TABS
10.0000 mg | ORAL_TABLET | Freq: Every day | ORAL | Status: DC
  Administered 2016-07-09: 10 mg via ORAL
  Filled 2016-07-09: qty 1

## 2016-07-09 MED ORDER — LOSARTAN POTASSIUM 50 MG PO TABS: 25.0000 mg | ORAL_TABLET | Freq: Every day | ORAL | Status: AC

## 2016-07-09 MED ORDER — WARFARIN SODIUM 5 MG PO TABS
2.5000 mg | ORAL_TABLET | Freq: Once | ORAL | Status: AC
  Administered 2016-07-09: 2.5 mg via ORAL
  Filled 2016-07-09: qty 1

## 2016-07-09 NOTE — Progress Notes (Signed)
CSW engaged with pt and pt's wife at pt's bedside. CSW introduced herself and her role as a Child psychotherapistsocial worker. CSW explained that pt has received bed offers at [facility] and [facility]. [facility]. CS[facility] [phone]) to facilitate transfer. Administrative staff is currently unable. CSW left a message, awaiting call back.  Jonathon JordanLynn B Reema Chick, MSW, LCSWA Weekend Coverage [phone]

## 2016-07-09 NOTE — Progress Notes (Signed)
ANTICOAGULATION CONSULT NOTE - Follow Up Consult  Pharmacy Consult for: heparin bridge to warfarin Indication: DVT in right leg 07/02/16  Allergies  Allergen Reactions  . Latex Other (See Comments)    Per MAR  . Macrobid [employer][Nitrofurantoin Monohyd Macro] Other (See Comments)    Per MAR  . Morphine And Related Other (See Comments)    [facility]    Patient Measurements: Height: 6\' 4"  (193 cm) Weight: 177 lb 11.1 oz (80.6 kg) IBW/kg (Calculated) : 86.8  Vital Signs: Temp: 98.1 F (36.7 C) (10/07 0912) Temp Source: Oral (10/07 0912) BP: 134/59 (10/07 0912) Pulse Rate: 67 (10/07 0912)  Labs:  Recent Labs  07/07/16 0156 07/07/16 0157 07/08/16 0618 07/08/16 1925 07/09/16 0337  HGB 12.9* 12.4* 11.2*  --  10.8*  HCT 38.0* 38.0* 34.8*  --  34.3*  PLT  --  258 209  --  212  APTT  --  29  --   --   --   LABPROT  --  14.6 18.6*  --   --   INR  --  1.14 1.53  --   --   HEPARINUNFRC  --   --   --  0.45 0.47  CREATININE 0.90 0.97 0.76  --   --     Estimated Creatinine Clearance: 100.8 mL/min (by C-G formula based on SCr of 0.76 mg/dL).  Assessment: 70 y.o male on IV heparin bridged to coumadin for recent DVT in right leg 07/02/16.    Coumadin PTA regimen: 7.5mg  daily , last taken PTA on 07/06/16, Admit INR 1.14  Patient was therapeutic on heparin 1200 units/hr this am with level of 0.47. INR was not drawn/run as ordered and now patient is refusing all labs. Last INR was 1.53.  Patient likely going to SNF today  Goal of Therapy:  INR 2-3 Monitor platelets by anticoagulation protocol: Yes   Plan:  Coumadin 2.5 mg x1 Continue heparin 1200 units/hr Daily HL, INR, CBC  Isaac BlissMichael Jasminne Mealy, PharmD, BCPS, [facility] Clinical Pharmacist Pager [phone] 07/09/2016 12:58 PM

## 2016-07-09 NOTE — Progress Notes (Signed)
PROGRESS NOTE    Gilbert Lewis  ZOX:[id number] DOB: [birth year] DOA: 07/07/2016 PCP: Oneal Grout, MD   Brief Narrative:  Gilbert Lewis is a 70 y.o. male with medical history significant of CKD, depression, CVA with residual right-sided weakness, DM, retinopathy, HTN, presenting from nursing home, Gilbert Lewis for evaluation of difficulty speaking and left-sided weakness. Patient last known normal sometime prior to 2300 on 07/06/2016. Nursing home staff noted that patient was having a hard time finding words to speak and had minimal use of left upper and lower extremities. EMS was called and patient brought to [facility] emergency room. 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 Code stroke was initiated when patient arrived to [facility] ED. Some symptoms began to resolve and CT scan initially showed no stroke. Patient is not able to receive an MRI due to spinal surgery and difficulty with getting in the scanner. Currently patient denies any focal complaints other than difficulty with speech, including chest pain, shortness breath, palpitations, neck stiffness, headache, vertigo, dysuria, frequency, fevers. Patient states that his left-sided weakness has resolved but his speech continues to be difficult. Denies any recent seizures.   Assessment & Plan:   Active Problems:   BPH (benign prostatic hyperplasia)   Chronic pain syndrome   DVT (deep venous thrombosis) (HCC)   TIA (transient ischemic attack)   Diabetes mellitus with complication (HCC)   Dysarthria   Seizures (HCC)   Stroke-like symptoms   Elevated BUN   Normochromic normocytic anemia   Subtherapeutic international normalized ratio (INR)   Dysarthria/R sided weakness:  - TIA vs stroke - Neurology consulted appreciate their recommendations - unable to obtain MRI due to spasticity  - anticipate transfer to [facility] tomorrow - Echo  pending - Carrotid dopplers WNL - PT/OT/SLP  Seizures: - no recent reported seizure - Neuro concerned about partial seizures causing pts symptoms.  - Loading dose Keppra 1000mg  IV per neuro - Keppra 500 BID thereafter per neuro   Chronic pain:  - back surgery and contractures from stroke - continue oxycodone, baclofen - PT/OT eval  Depression/Anxiety: - continue zoloft, ativan  DVT: failed Xarelto on previous admission - Coumadin with bridge via lovenox (likely that facility will not be able to draw heparin levels) - will be able to discharge when bed available and stroke workup is complete (currently echocardiogram is pending)  BPH: continue proscar, flomax  DM: last A1c 5.4 - pre-dm. - hold metformin - SSI  HLD: - continue actigall  GERD: - continue ppi, carafate  HTN: - allow permissive HTN, hold home BP meds   DVT prophylaxis: coumadin with bridge Code Status: full Family Communication: SW has been in contact with Gilbert Lewis, patient's daughter who agrees with plan to finish workup here for TIA/CVA before transferring to [facility] pla Disposition Plan: pending Echocardiogram completion   Consultants:   Neurology  Procedures:   Carotid ultrasound performed  Echocardiogram pending  Antimicrobials:   none    Subjective: Patient was minimally interactive with me during exam.  Would open eyes did not converse with me.  Would open eyes for Dr. Pearlean Brownie.  Would not talk to either of [place].      Objective: Vitals:   07/08/16 2123 07/09/16 0130 07/09/16 0542 07/09/16 0912  BP: (!) 160/85 139/77 140/68 (!) 134/59  Pulse: 88 73 63 67  Resp: 16 16 18 18   Temp: 98.6 F (37 C) 98.6 F (  37 C) 98.2 F (36.8 C) 98.1 F (36.7 C)  TempSrc: Oral Oral Oral Oral  SpO2: 99% 99% 99% 100%  Weight:      Height:        Intake/Output Summary (Last 24 hours) at 07/09/16 1454 Last data filed at 07/09/16 0500  Gross per 24 hour  Intake           2073.6 ml  Output              2250 ml  Net           -176.4 ml   Filed Weights   07/07/16 0100 07/07/16 0216  Weight: 80.6 kg (177 lb 11.1 oz) 80.6 kg (177 lb 11.1 oz)    Examination:  General exam: Appears calm and comfortable  Respiratory system: Clear to auscultation. Respiratory effort normal. Cardiovascular system: S1 & S2 heard, RRR. No JVD, murmurs, rubs, gallops or clicks. No pedal edema. Gastrointestinal system: Abdomen is nondistended, soft and nontender. No organomegaly or masses felt. Normal bowel sounds heard. Central nervous system: unable to assess due to lack of participation by patient. Extremities: unable to assess, contracture of the right upper extremity  Skin: No rashes, lesions or ulcers Psychiatry: unable to assess.     Data Reviewed: I have personally reviewed following labs and imaging studies  CBC:  Recent Labs Lab 07/03/16 0243 07/07/16 0156 07/07/16 0157 07/08/16 0618 07/09/16 0337  WBC 6.7  --  9.5 5.7 6.4  NEUTROABS  --   --  5.1  --   --   HGB 11.3* 12.9* 12.4* 11.2* 10.8*  HCT 34.5* 38.0* 38.0* 34.8* 34.3*  MCV 87.8  --  89.0 88.3 88.2  PLT 232  --  258 209 212   Basic Metabolic Panel:  Recent Labs Lab 07/07/16 0156 07/07/16 0157 07/08/16 0618  NA 137 134* 136  K 4.2 4.2 4.0  CL 101 100* 103  CO2  --  25 27  GLUCOSE 86 92 99  BUN 26* 23* 20  CREATININE 0.90 0.97 0.76  CALCIUM  --  9.4 9.0   GFR: Estimated Creatinine Clearance: 100.8 mL/min (by C-G formula based on SCr of 0.76 mg/dL). Liver Function Tests:  Recent Labs Lab 07/07/16 0157  AST 18  ALT 17  ALKPHOS 140*  BILITOT 0.6  PROT 6.8  ALBUMIN 3.8   No results for input(s): LIPASE, AMYLASE in the last 168 hours. No results for input(s): AMMONIA in the last 168 hours. Coagulation Profile:  Recent Labs Lab 07/03/16 0243 07/07/16 0157 07/08/16 0618  INR 1.42 1.14 1.53   Cardiac Enzymes: No results for input(s): CKTOTAL, CKMB, CKMBINDEX, TROPONINI in the last 168 hours. BNP  (last 3 results) No results for input(s): PROBNP in the last 8760 hours. HbA1C: No results for input(s): HGBA1C in the last 72 hours. CBG:  Recent Labs Lab 07/08/16 0739 07/08/16 1146 07/08/16 1628 07/08/16 2112 07/09/16 0631  GLUCAP 99 166* 111* 108* 110*   Lipid Profile: No results for input(s): CHOL, HDL, LDLCALC, TRIG, CHOLHDL, LDLDIRECT in the last 72 hours. Thyroid Function Tests: No results for input(s): TSH, T4TOTAL, FREET4, T3FREE, THYROIDAB in the last 72 hours. Anemia Panel: No results for input(s): VITAMINB12, FOLATE, FERRITIN, TIBC, IRON, RETICCTPCT in the last 72 hours. Sepsis Labs: No results for input(s): PROCALCITON, LATICACIDVEN in the last 168 hours.  Recent Results (from the past 240 hour(s))  MRSA PCR Screening     Status: None   Collection Time: 06/30/16 10:26 PM  Result Value Ref Range Status   MRSA by PCR NEGATIVE NEGATIVE Final    Comment:        The GeneXpert MRSA Assay (FDA approved for NASAL specimens only), is one component of a comprehensive MRSA colonization surveillance program. It is not intended to diagnose MRSA infection nor to guide or monitor treatment for MRSA infections.          Radiology Studies: No results found.      Scheduled Meds: .  stroke: mapping our early stages of recovery book   Does not apply Once  . aspirin  300 mg Rectal Daily   Or  . aspirin  325 mg Oral Daily  . atorvastatin  10 mg Oral q1800  . baclofen  10 mg Oral Once  . baclofen  10 mg Oral TID  . feeding supplement (PRO-STAT SUGAR FREE 64)  30 mL Oral BID  . ferrous sulfate  325 mg Oral Q breakfast  . finasteride  5 mg Oral QPM  . insulin aspart  0-5 Units Subcutaneous QHS  . insulin aspart  0-9 Units Subcutaneous TID WC  . levETIRAcetam  500 mg Intravenous Q12H  . LORazepam  0.25 mg Oral QHS  . Melatonin  3 tablet Oral QHS  . pantoprazole  40 mg Oral Daily  . polyethylene glycol  17 g Oral Daily  . senna  2 tablet Oral BID  .  sertraline  150 mg Oral Daily  . sucralfate  1 g Oral TID WC & HS  . tamsulosin  0.4 mg Oral QPC supper  . ursodiol  600 mg Oral BID  . warfarin  2.5 mg Oral ONCE-1800  . Warfarin - Pharmacist Dosing Inpatient   Does not apply q1800   Continuous Infusions: . sodium chloride 75 mL/hr at 07/09/16 0800  . heparin 1,200 Units/hr (07/09/16 0700)     LOS: 0 days    Time spent: 45 minutes    Bennett ScrapeAlex Lanay Zinda, MD [facility] Pager [phone]  If 7PM-7AM, please contact night-coverage [email or web address] Password [email or web address] 07/09/2016, 2:54 PM

## 2016-07-09 NOTE — Discharge Summary (Addendum)
Physician Discharge Summary  Neville Walston ZOX:[id number] DOB: 02-Jul-1946 DOA: 07/07/2016  PCP: Oneal Grout, MD  Admit date: 07/07/2016 Discharge date: 07/10/2016  Admitted From: SNF Disposition:  SNF- [facility]  Recommendations for Outpatient Follow-up:  1. See MD at first visit 2. Please reassess need for higher dose antihypertensive as Losartan decreased to 25mg  3. Please draw daily INR 4. Please obtain BMP/CBC in am 5. Titrate coumadin pending INR results 6. PT/OT/ SLP eval and treat  Home Health:no Equipment/Devices: None  Discharge Condition: stable CODE STATUS:Full Code Diet recommendation: Heart Healthy / Carb Modified   Brief/Interim Summary: Gilbert Lewis a 70 y.o.malewith medical history significant of CKD, depression, CVA with residual right-sided weakness, DM, retinopathy, HTN, presenting from nursing home, [facility] for evaluation of difficulty speaking and left-sided weakness. Patient last known normal sometime prior to 2300 on 07/06/2016. Nursing home staff noted that patient was having a hard time finding words to speak and had minimal use of left upper and lower extremities. EMS was called and patient brought to [facility] emergency room. 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 Code stroke was initiated when patient arrived to [facility] ED. Some symptoms began to resolve and CT scan initially showed no stroke. Patient is not able to receive an MRI due to spinal surgery and difficulty with getting in the scanner. Currently patient denies any focal complaints other than difficulty with speech, including chest pain, shortness breath, palpitations, neck stiffness, headache, vertigo, dysuria, frequency, fevers. Patient states that his left-sided weakness has resolved but his speech continues to be difficult. Denies any recent seizures. Patient was seen by neurology and  stroke team signed off.  Patient to be discharge to [facility] for SNF.  He was not able to undergo MRI due to spasticity.  Discharge Diagnoses:  Active Problems:   BPH (benign prostatic hyperplasia)   Chronic pain syndrome   DVT (deep venous thrombosis) (HCC)   TIA (transient ischemic attack)   Diabetes mellitus with complication (HCC)   Dysarthria   Seizures (HCC)   Stroke-like symptoms   Elevated BUN   Normochromic normocytic anemia   Subtherapeutic international normalized ratio (INR)    Discharge Instructions  Discharge Instructions    Call MD for:  difficulty breathing, headache or visual disturbances    Complete by:  As directed    Call MD for:  difficulty breathing, headache or visual disturbances    Complete by:  As directed    Call MD for:  extreme fatigue    Complete by:  As directed    Call MD for:  extreme fatigue    Complete by:  As directed    Call MD for:  persistant dizziness or light-headedness    Complete by:  As directed    Call MD for:  persistant dizziness or light-headedness    Complete by:  As directed    Call MD for:  persistant nausea and vomiting    Complete by:  As directed    Call MD for:  redness, tenderness, or signs of infection (pain, swelling, redness, odor or green/yellow discharge around incision site)    Complete by:  As directed    Call MD for:  redness, tenderness, or signs of infection (pain, swelling, redness, odor or green/yellow discharge around incision site)    Complete by:  As directed    Call MD for:  severe uncontrolled pain  Complete by:  As directed    Call MD for:  temperature >100.4    Complete by:  As directed    Call MD for:  temperature >100.4    Complete by:  As directed    Diet - low sodium heart healthy    Complete by:  As directed    Diet - low sodium heart healthy    Complete by:  As directed    Diet Carb Modified    Complete by:  As directed    Diet Carb Modified    Complete by:  As directed     Increase activity slowly    Complete by:  As directed    Increase activity slowly    Complete by:  As directed        Medication List    STOP taking these medications   enoxaparin 120 MG/0.8ML injection Commonly known as:  LOVENOX   sodium chloride 1 g tablet     TAKE these medications   acetaminophen 325 MG tablet Commonly known as:  TYLENOL Take 650 mg by mouth every 4 (four) hours as needed for mild pain.   aspirin 325 MG tablet Take 1 tablet (325 mg total) by mouth daily.   atorvastatin 10 MG tablet Commonly known as:  LIPITOR Take 1 tablet (10 mg total) by mouth daily at 6 PM.   baclofen 10 MG tablet Commonly known as:  LIORESAL Take 10 mg by mouth 3 (three) times daily.   DECUBI-VITE Caps Take 1 capsule by mouth daily.   feeding supplement (PRO-STAT SUGAR FREE 64) Liqd Take 30 mLs by mouth 2 (two) times daily.   ferrous sulfate 325 (65 FE) MG tablet Take 325 mg by mouth daily with breakfast.   finasteride 5 MG tablet Commonly known as:  PROSCAR Take 5 mg by mouth every evening.   levETIRAcetam 250 MG tablet Commonly known as:  KEPPRA Take 250 mg by mouth 2 (two) times daily.   LORazepam 0.5 MG tablet Commonly known as:  ATIVAN Take 0.5 tablets (0.25 mg total) by mouth at bedtime.   losartan 50 MG tablet Commonly known as:  COZAAR Take 0.5 tablets (25 mg total) by mouth daily. What changed:  how much to take   Melatonin 10 MG Tabs Take 10 mg by mouth at bedtime.   metFORMIN 1000 MG tablet Commonly known as:  GLUCOPHAGE Take 1,000 mg by mouth 2 (two) times daily with a meal.   metoprolol tartrate 25 MG tablet Commonly known as:  LOPRESSOR Take 0.5 tablets (12.5 mg total) by mouth 2 (two) times daily.   omeprazole 20 MG capsule Commonly known as:  PRILOSEC Take 20 mg by mouth daily.   ondansetron 8 MG tablet Commonly known as:  ZOFRAN Take 8 mg by mouth every 8 (eight) hours as needed for nausea or vomiting.   Oxycodone HCl 10 MG  Tabs Take one tablet by mouth every 6 hours as needed for severe pain   polyethylene glycol packet Commonly known as:  MIRALAX / GLYCOLAX Take 17 g by mouth daily.   senna 8.6 MG tablet Commonly known as:  SENOKOT Take 2 tablets by mouth 2 (two) times daily.   sertraline 100 MG tablet Commonly known as:  ZOLOFT Take 150 mg by mouth daily.   solifenacin 5 MG tablet Commonly known as:  VESICARE Take 5 mg by mouth daily.   sucralfate 1 g tablet Commonly known as:  CARAFATE Take 1 g by mouth 4 (four) times  daily -  with meals and at bedtime.   tamsulosin 0.4 MG Caps capsule Commonly known as:  FLOMAX Take 0.4 mg by mouth daily after supper.   ursodiol 500 MG tablet Commonly known as:  ACTIGALL Take 500 mg by mouth 2 (two) times daily.   vitamin B-12 1000 MCG tablet Commonly known as:  CYANOCOBALAMIN Take 1,000 mcg by mouth daily.   warfarin 1 MG tablet Commonly known as:  COUMADIN Take 1 tablet (1 mg total) by mouth one time only at 6 PM. What changed:  medication strength  how much to take  when to take this       Allergies  Allergen Reactions  . Latex Other (See Comments)    Per MAR  . Macrobid [employer] Macro] Other (See Comments)    Per MAR  . Morphine And Related Other (See Comments)    Per MAR    Consultations:  SLP  OT/PT   Neurology   Procedures/Studies: Ct Angio Head W/cm &/or Wo Cm  Result Date: 07/07/2016 CLINICAL DATA:  Left-sided weakness.  History of old stroke. EXAM: CT ANGIOGRAPHY HEAD TECHNIQUE: Multidetector CT imaging of the head was performed using the standard protocol during bolus administration of intravenous contrast. Multiplanar CT image reconstructions and MIPs were obtained to evaluate the vascular anatomy. CONTRAST:  50 mL Isovue 370 IV COMPARISON:  Head CT 07/07/2016 FINDINGS: CTA HEAD Hypoattenuation is again seen within the left MCA territory, reflecting remote infarct. Anterior circulation: --Intracranial  internal carotid arteries: Normal. --Anterior cerebral arteries: Normal. --Middle cerebral arteries: Normal. --Posterior communicating arteries: Present on the right. Not seen on the left. Posterior circulation: --Posterior cerebral arteries: Normal. --Superior cerebellar arteries: Normal. --Basilar artery: Normal. --Anterior inferior cerebellar arteries: Not clearly visualize, which is not uncommon. --Posterior inferior cerebellar arteries: Normal. --Vertebral arteries: Right-dominant. The left vertebral artery is hypoplastic and terminates in PICA. There is chunky atherosclerotic calcification at the proximal left V4 segment resulting in at least moderate stenosis. Calcification in the proximal right V4 segment causes at least mild stenosis. Venous sinuses: As permitted by contrast timing, patent. Anatomic variants: Fetal predominant origin of the right posterior cerebral artery. Delayed phase: No parenchymal contrast enhancement. IMPRESSION: 1. No occlusion or high-grade stenosis of the intracranial arteries. 2. Mild atherosclerotic calcification within the proximal V4 segments of the vertebral arteries, with resultant mild-to-moderate stenosis. 3. Left MCA distribution encephalomalacia secondary to remote infarct. Electronically Signed   By: Deatra Robinson M.D.   On: 07/07/2016 13:45   Ct Head Wo Contrast  Result Date: 07/07/2016 CLINICAL DATA:  LEFT-sided weakness, last seen normal at 2300 hours. History of hypertension, diabetes and stroke. EXAM: CT HEAD WITHOUT CONTRAST TECHNIQUE: Contiguous axial images were obtained from the base of the skull through the vertex without intravenous contrast. COMPARISON:  None. FINDINGS: BRAIN: No intraparenchymal hemorrhage, mass effect, midline shift or acute large vascular territory infarcts. LEFT frontotemporal/insular encephalomalacia. Old LEFT basal ganglia and thalamus infarcts with ex vacuo dilatation LEFT lateral ventricle. No hydrocephalus. LEFT basal ganglia  mineralization. Patchy to confluent supratentorial white matter hypodensities. Diminutive LEFT cerebral peduncle compatible with wallerian degeneration. No abnormal extra-axial fluid collections. Basal cisterns are patent. VASCULAR: Mild calcific atherosclerosis the carotid siphons. No dense MCA. SKULL: No skull fracture. No significant scalp soft tissue swelling. SINUSES/ORBITS: The mastoid air-cells and included paranasal sinuses are well-aerated.The included ocular globes and orbital contents are non-suspicious. OTHER: None. IMPRESSION: No acute intracranial process. Old moderate LEFT MCA territory infarct. Moderate to severe white matter changes  compatible with chronic small vessel ischemic disease. Acute findings discussed with and reconfirmed by Dr.Stewart, Neurology on 07/07/2016 at 2:19 am. Electronically Signed   By: Awilda Metro M.D.   On: 07/07/2016 02:20   Ct Angio Chest Pe W And/or Wo Contrast  Result Date: 06/30/2016 CLINICAL DATA:  70 year old presenting from a nursing home with lower extremity edema and shortness of breath. By report, ultrasound performed at the facility demonstrates DVT. EXAM: CT ANGIOGRAPHY CHEST WITH CONTRAST TECHNIQUE: Multidetector CT imaging of the chest was performed using the standard protocol during bolus administration of intravenous contrast. Multiplanar CT image reconstructions and MIPs were obtained to evaluate the vascular anatomy. CONTRAST:  100 mL Isovue 370 IV. COMPARISON:  None. FINDINGS: Technical quality: Adequate. Respiratory motion blurs images of the lung bases. As the patient was unable to raise the arms, beam hardening streak artifact is present. Cardiovascular: No filling defects within either main pulmonary artery or their branches in either lung to suggest pulmonary embolism. Heart size upper normal to slightly enlarged. No pericardial effusion. Mild LAD coronary atherosclerosis. Compression of the right atrium by the elevated right hemidiaphragm.  Thoracic and upper abdominal aorta tortuous and mildly atherosclerotic without evidence of aneurysm or dissection. Proximal great vessels widely patent with mild atherosclerosis. Mediastinum/Nodes: No pathologically enlarged mediastinal, hilar or axillary lymph nodes. No mediastinal masses. Normal-appearing esophagus. Thyroid gland normal in size containing a 7 mm nodule in the lower pole of the right lobe. Lungs/Pleura: Scar/atelectasis involving the right lower lobe due to the elevated right hemidiaphragm. Mild dependent atelectasis posteriorly in both lower lobes, as expected. Lungs otherwise clear. No pulmonary parenchymal nodules or masses. No pleural effusions. Central airways patent without significant bronchial wall thickening. Upper Abdomen: Large stool burden throughout the visualized colon. Interposition of the hepatic flexure the colon between the liver and the right hemidiaphragm. Likely benign complex cysts arising from the upper pole and mid right kidney. Musculoskeletal: Osseous demineralization. 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 Exaggeration of the usual thoracic kyphosis. Review of the MIP images confirms the above findings. IMPRESSION: 1. No evidence of pulmonary embolism. 2. Borderline heart size. Mild LAD coronary atherosclerosis. Mild thoracic and upper abdominal aortic atherosclerosis. 3. Elevation of the right hemidiaphragm with scar/atelectasis involving the right lower lobe. No acute cardiopulmonary disease otherwise. 4. Likely benign complex cysts involving the upper pole and mid right kidney. 5. Large stool burden throughout the visualized colon. 6. 7 mm right lobe thyroid nodule which does not require further imaging follow-up. Electronically Signed   By: Hulan Saas M.D.   On: 06/30/2016 18:33   Dg Chest Port 1 View  Result Date: 07/07/2016 CLINICAL DATA:  Shortness of breath, weakness, fatigue EXAM: PORTABLE CHEST 1 VIEW COMPARISON:  CT chest of 06/30/2016  FINDINGS: No active infiltrate or effusion is seen. The lungs are not optimally aerated. Mediastinal and hilar contours are unremarkable. The heart is borderline enlarged. There are degenerative changes throughout the thoracic spine. IMPRESSION: Suboptimal inspiration. No definite active process. Mild cardiomegaly. Electronically Signed   By: Dwyane Dee M.D.   On: 07/07/2016 13:57   Dg Swallowing Func-speech Pathology  Result Date: 07/07/2016 Objective Swallowing Evaluation: Type of Study: MBS-Modified Barium Swallow Study Patient Details Name: Gilbert Lewis MRN: [id number] Date of Birth: 1946-03-20 Today's Date: 07/07/2016 Time: SLP Start Time (ACUTE ONLY): 1147-SLP Stop Time (ACUTE ONLY): 1206 SLP Time Calculation (min) (ACUTE ONLY): 19 min Past Medical History: Past Medical History: Diagnosis Date . Cataract  . CKD (chronic kidney disease)  .  Depression  . Diabetes mellitus without complication (HCC)  . Diabetic retinopathy (HCC)  . Hypertension  . Stroke [facility](HCC)  Past Surgical History: No past surgical history on file. HPI: 70 y.o.malewith medical history significant of CKD, depression, CVA with residual right-sided weakness, DM, retinopathy, HTN, presenting with difficulty speaking and left-sided weakness. Head CT showed old moderate LEFT MCA territory infarct and no acute infarct. Pt failed SSS due to h/o dysphagia. Subjective: pt alert and cooperative Assessment / Plan / Recommendation CHL IP CLINICAL IMPRESSIONS 07/07/2016 Therapy Diagnosis Mild pharyngeal phase dysphagia Clinical Impression Pt presents with mild pharyngeal dysphagia. He displayed no events of aspiration or penetration. Pt had a mild delay in swallow initiation to the valleculae for pureed and regular solids. Following all PO trials there was trace vallecular residue observed, and UES residue was seen with thin liquid trials. Recommend regular diet and thin liquids. Given pt's performance, h/o stroke, and anatomical differences (dx of DISH  per care everywhere), SLP will s/o given pt's swallow function is likely at baseline. Impact on safety and function Mild aspiration risk   CHL IP TREATMENT RECOMMENDATION 07/07/2016 Treatment Recommendations No treatment recommended at this time   Prognosis 07/07/2016 Prognosis for Safe Diet Advancement Good Barriers to Reach Goals Cognitive deficits Barriers/Prognosis Comment -- CHL IP DIET RECOMMENDATION 07/07/2016 SLP Diet Recommendations Regular solids;Thin liquid Liquid Administration via Cup;Straw Medication Administration Whole meds with liquid Compensations Minimize environmental distractions;Slow rate;Small sips/bites Postural Changes Remain semi-upright after after feeds/meals (Comment);Seated upright at 90 degrees   CHL IP OTHER RECOMMENDATIONS 07/07/2016 Recommended Consults -- Oral Care Recommendations Oral care BID Other Recommendations --   CHL IP FOLLOW UP RECOMMENDATIONS 07/07/2016 Follow up Recommendations Skilled Nursing facility;24 hour supervision/assistance   CHL IP FREQUENCY AND DURATION 07/07/2016 Speech Therapy Frequency (ACUTE ONLY) min 2x/week Treatment Duration --      CHL IP ORAL PHASE 07/07/2016 Oral Phase WFL Oral - Pudding Teaspoon -- Oral - Pudding Cup -- Oral - Honey Teaspoon -- Oral - Honey Cup -- Oral - Nectar Teaspoon -- Oral - Nectar Cup -- Oral - Nectar Straw -- Oral - Thin Teaspoon -- Oral - Thin Cup -- Oral - Thin Straw -- Oral - Puree -- Oral - Mech Soft -- Oral - Regular -- Oral - Multi-Consistency -- Oral - Pill -- Oral Phase - Comment --  CHL IP PHARYNGEAL PHASE 07/07/2016 Pharyngeal Phase Impaired Pharyngeal- Pudding Teaspoon -- Pharyngeal -- Pharyngeal- Pudding Cup -- Pharyngeal -- Pharyngeal- Honey Teaspoon -- Pharyngeal -- Pharyngeal- Honey Cup -- Pharyngeal -- Pharyngeal- Nectar Teaspoon -- Pharyngeal -- Pharyngeal- Nectar Cup -- Pharyngeal -- Pharyngeal- Nectar Straw -- Pharyngeal -- Pharyngeal- Thin Teaspoon -- Pharyngeal -- Pharyngeal- Thin Cup Pharyngeal residue -  valleculae;Pharyngeal residue - cp segment Pharyngeal -- Pharyngeal- Thin Straw Pharyngeal residue - valleculae Pharyngeal -- Pharyngeal- Puree Delayed swallow initiation-vallecula;Pharyngeal residue - valleculae Pharyngeal -- Pharyngeal- Mechanical Soft -- Pharyngeal -- Pharyngeal- Regular Delayed swallow initiation-vallecula;Pharyngeal residue - valleculae Pharyngeal -- Pharyngeal- Multi-consistency -- Pharyngeal -- Pharyngeal- Pill WFL Pharyngeal -- Pharyngeal Comment --  CHL IP CERVICAL ESOPHAGEAL PHASE 07/07/2016 Cervical Esophageal Phase Impaired Pudding Teaspoon -- Pudding Cup -- Honey Teaspoon -- Honey Cup -- Nectar Teaspoon -- Nectar Cup -- Nectar Straw -- Thin Teaspoon -- Thin Cup Reduced cricopharyngeal relaxation Thin Straw Reduced cricopharyngeal relaxation Puree Reduced cricopharyngeal relaxation Mechanical Soft -- Regular Reduced cricopharyngeal relaxation Multi-consistency -- Pill Reduced cricopharyngeal relaxation Cervical Esophageal Comment -- CHL IP GO 07/07/2016 Functional Assessment Tool Used skilled clinical judgment Functional Limitations Swallowing  Swallow Current Status [phone]) CI Swallow Goal Status [phone]) CI Swallow Discharge Status [phone]) CI Motor Speech Current Status [phone]) (None) Motor Speech Goal Status [phone]) (None) Motor Speech Goal Status [phone]) (None) Spoken Language Comprehension Current Status [phone]) (None) Spoken Language Comprehension Goal Status (N6295) (None) Spoken Language Comprehension Discharge Status [phone]) (None) Spoken Language Expression Current Status [phone]) (None) Spoken Language Expression Goal Status [phone]) (None) Spoken Language Expression Discharge Status [phone]) (None) Attention Current Status (I3474) (None) Attention Goal Status (Q5956) (None) Attention Discharge Status [phone]) (None) Memory Current Status (E3329) (None) Memory Goal Status (J1884) (None) Memory Discharge Status (Z6606) (None) Voice Current Status (T0160) (None) Voice Goal Status  (F0932) (None) Voice Discharge Status (T5573) (None) Other Speech-Language Pathology Functional Limitation [phone]) (None) Other Speech-Language Pathology Functional Limitation Goal Status (K2706) (None) Other Speech-Language Pathology Functional Limitation Discharge Status [phone]) (None) Maxcine Ham 07/07/2016, 12:54 PM  Note populated for Jearl Klinefelter, student SLP Maxcine Ham, M.A. CCC-SLP [phone]                Subjective: Patient seen and was awake and eating lunch.  He was verbal today and asked who I was.  He does not recall seeing me for the past couple of days.  He is eating well.  Understands he will go to [facility] today.  Echocardiogram was performed this morning. No concerns voiced.  INR therapeutic today.  Patient daughter Jake Samples was informed of discharge to [facility] today.  Discharge Exam: Vitals:   07/10/16 0522 07/10/16 0929  BP: 133/69 (!) 155/68  Pulse: 61 66  Resp: 16 18  Temp: 97.7 F (36.5 C) 98.3 F (36.8 C)   Vitals:   07/09/16 2158 07/10/16 0210 07/10/16 0522 07/10/16 0929  BP: 138/61 134/73 133/69 (!) 155/68  Pulse: 82 78 61 66  Resp: 16 18 16 18   Temp: 99 F (37.2 C) 98 F (36.7 C) 97.7 F (36.5 C) 98.3 F (36.8 C)  TempSrc: Axillary Oral Axillary Oral  SpO2: 98% 97% 100% 100%  Weight:      Height:        General: Pt is alert, awake, not in acute distress Cardiovascular: RRR, S1/S2 +, no rubs, no gallops Respiratory: CTA bilaterally, no wheezing, no rhonchi Abdominal: Soft, NT, ND, bowel sounds + Extremities: no edema, no cyanosis, contractures of right upper extremity    The results of significant diagnostics from this hospitalization (including imaging, microbiology, ancillary and laboratory) are listed below for reference.     Microbiology: Recent Results (from the past 240 hour(s))  MRSA PCR Screening     Status: None   Collection Time: 06/30/16 10:26 PM  Result Value Ref Range Status   MRSA by PCR NEGATIVE NEGATIVE  Final    Comment:        The GeneXpert MRSA Assay (FDA approved for NASAL specimens only), is one component of a comprehensive MRSA colonization surveillance program. It is not intended to diagnose MRSA infection nor to guide or monitor treatment for MRSA infections.      Labs: BNP (last 3 results)  Recent Labs  06/30/16 1616  BNP 57.3   Basic Metabolic Panel:  Recent Labs Lab 07/07/16 0156 07/07/16 0157 07/08/16 0618  NA 137 134* 136  K 4.2 4.2 4.0  CL 101 100* 103  CO2  --  25 27  GLUCOSE 86 92 99  BUN 26* 23* 20  CREATININE 0.90 0.97 0.76  CALCIUM  --  9.4 9.0   Liver Function Tests:  Recent  Labs Lab 07/07/16 0157  AST 18  ALT 17  ALKPHOS 140*  BILITOT 0.6  PROT 6.8  ALBUMIN 3.8   No results for input(s): LIPASE, AMYLASE in the last 168 hours. No results for input(s): AMMONIA in the last 168 hours. CBC:  Recent Labs Lab 07/07/16 0156 07/07/16 0157 07/08/16 0618 07/09/16 0337 07/10/16 0347  WBC  --  9.5 5.7 6.4 6.5  NEUTROABS  --  5.1  --   --   --   HGB 12.9* 12.4* 11.2* 10.8* 12.0*  HCT 38.0* 38.0* 34.8* 34.3* 37.4*  MCV  --  89.0 88.3 88.2 87.6  PLT  --  258 209 212 224   Cardiac Enzymes: No results for input(s): CKTOTAL, CKMB, CKMBINDEX, TROPONINI in the last 168 hours. BNP: Invalid input(s): POCBNP CBG:  Recent Labs Lab 07/09/16 0631 07/09/16 1613 07/09/16 2056 07/10/16 0623 07/10/16 1122  GLUCAP 110* 102* 149* 104* 140*   D-Dimer No results for input(s): DDIMER in the last 72 hours. Hgb A1c No results for input(s): HGBA1C in the last 72 hours. Lipid Profile No results for input(s): CHOL, HDL, LDLCALC, TRIG, CHOLHDL, LDLDIRECT in the last 72 hours. Thyroid function studies No results for input(s): TSH, T4TOTAL, T3FREE, THYROIDAB in the last 72 hours.  Invalid input(s): FREET3 Anemia work up No results for input(s): VITAMINB12, FOLATE, FERRITIN, TIBC, IRON, RETICCTPCT in the last 72 hours. Urinalysis    Component  Value Date/Time   COLORURINE YELLOW 07/07/2016 1020   APPEARANCEUR CLEAR 07/07/2016 1020   LABSPEC 1.015 07/07/2016 1020   PHURINE 7.0 07/07/2016 1020   GLUCOSEU NEGATIVE 07/07/2016 1020   HGBUR NEGATIVE 07/07/2016 1020   BILIRUBINUR NEGATIVE 07/07/2016 1020   KETONESUR NEGATIVE 07/07/2016 1020   PROTEINUR NEGATIVE 07/07/2016 1020   NITRITE NEGATIVE 07/07/2016 1020   LEUKOCYTESUR NEGATIVE 07/07/2016 1020   Sepsis Labs Invalid input(s): PROCALCITONIN,  WBC,  LACTICIDVEN Microbiology Recent Results (from the past 240 hour(s))  MRSA PCR Screening     Status: None   Collection Time: 06/30/16 10:26 PM  Result Value Ref Range Status   MRSA by PCR NEGATIVE NEGATIVE Final    Comment:        The GeneXpert MRSA Assay (FDA approved for NASAL specimens only), is one component of a comprehensive MRSA colonization surveillance program. It is not intended to diagnose MRSA infection nor to guide or monitor treatment for MRSA infections.      Time coordinating discharge: Over 30 minutes  SIGNED:   Bennett Scrape, MD  [facility] 07/10/2016, 3:13 PM Pager [phone] If 7PM-7AM, please contact night-coverage [email or web address] Password [email or web address]

## 2016-07-09 NOTE — Progress Notes (Signed)
Patient refused to have his labs drawn. Family at bedside.

## 2016-07-09 NOTE — Progress Notes (Signed)
STROKE TEAM PROGRESS NOTE   HISTORY OF PRESENT ILLNESS (per record) Gilbert Lewis is an 70 y.o. male left MCA stroke with right hemiplegia, diabetes mellitus, seizure disorder, hypertension, chronic kidney disease and depression, brought to the emergency room and code stroke status following new onset of not moving left side and unable to speak. Patient was recently hospitalized for suspected DVT. He was on Xarelto the time of admission and was switched to Coumadin with Lovenox bridge. CT scan of his head showed no acute findings. Patient began to respond verbally after obtaining CT scan of his head, and was able to follow commands with use of his left upper extremity, although he was confused. Exam showed marked spasticity of his right upper and lower extremities as well as left lower extremity. Muscle tone was slightly increased in left upper extremity. His overall NIH strokes score was 16, with deficits thought to largely be due to previous stroke (S). INR was 1.14 and APTT was 29. He was LKW at 11:00 PM on 07/06/2016. Patient was not administered IV t-PA secondary to being beyond 3 hour time window for treatment. He was admitted for further evaluation and treatment.   SUBJECTIVE (INTERVAL HISTORY) His wife and daughter are at the bedside.   . Overall he feels his condition is gradually improving. He  is alert and interactive today. He feels his left-sided strength is returning back to baseline. OBJECTIVE Temp:  [98.1 F (36.7 C)-98.6 F (37 C)] 98.1 F (36.7 C) (10/07 0912) Pulse Rate:  [63-88] 67 (10/07 0912) Cardiac Rhythm: Sinus bradycardia;Heart block (10/07 0842) Resp:  [16-18] 18 (10/07 0912) BP: (134-160)/(59-85) 134/59 (10/07 0912) SpO2:  [99 %-100 %] 100 % (10/07 0912)  CBC:   Recent Labs Lab 07/07/16 0157 07/08/16 0618 07/09/16 0337  WBC 9.5 5.7 6.4  NEUTROABS 5.1  --   --   HGB 12.4* 11.2* 10.8*  HCT 38.0* 34.8* 34.3*  MCV 89.0 88.3 88.2  PLT 258 209 212    Basic  Metabolic Panel:   Recent Labs Lab 07/07/16 0157 07/08/16 0618  NA 134* 136  K 4.2 4.0  CL 100* 103  CO2 25 27  GLUCOSE 92 99  BUN 23* 20  CREATININE 0.97 0.76  CALCIUM 9.4 9.0    Lipid Panel:     Component Value Date/Time   CHOL 127 07/01/2016 0338   TRIG 59 07/01/2016 0338   HDL 37 (L) 07/01/2016 0338   CHOLHDL 3.4 07/01/2016 0338   VLDL 12 07/01/2016 0338   LDLCALC 78 07/01/2016 0338   HgbA1c:  Lab Results  Component Value Date   HGBA1C 5.4 07/01/2016   Urine Drug Screen:     Component Value Date/Time   LABOPIA NONE DETECTED 07/07/2016 1020   COCAINSCRNUR NONE DETECTED 07/07/2016 1020   LABBENZ NONE DETECTED 07/07/2016 1020   AMPHETMU NONE DETECTED 07/07/2016 1020   THCU NONE DETECTED 07/07/2016 1020   LABBARB NONE DETECTED 07/07/2016 1020      IMAGING  Ct Head Wo Contrast 07/07/2016 No acute intracranial process. Old moderate LEFT MCA territory infarct. Moderate to severe white matter changes compatible with chronic small vessel ischemic disease.   Ct Angio Head W/cm &/or Wo Cm 07/07/2016 1. No occlusion or high-grade stenosis of the intracranial arteries. 2. Mild atherosclerotic calcification within the proximal V4 segments of the vertebral arteries, with resultant mild-to-moderate stenosis. 3. Left MCA distribution encephalomalacia secondary to remote infarct.   Dg Chest Port 1 View 07/07/2016 Suboptimal inspiration. No definite active process. Mild  cardiomegaly.     PHYSICAL EXAM Frail elderly Caucasian male not in distress. . Afebrile. Head is nontraumatic. Neck is supple without bruit.    Cardiac exam no murmur or gallop. Lungs are clear to auscultation. Distal pulses are well felt. Neurological Exam :  Awake alert oriented 2. Dysarthric speech with nonfluent speech with slight word finding difficulties. Follows commands well. Extraocular movements are full range without nystagmus. Blinks to threat bilaterally. Fundi were not visualized. Right lower  facial weakness. Tongue midline. Spastic right hemiplegia with non-fixed flexion contractures the right elbow, wrist right knee and ankle. Pain on passive range of motion from the right. Antigravity movements on the left but mild left-sided weakness. Patient not cooperative for detailed muscle testing. Deep tendon reflexes are brisk on the right and normal on the left. Right plantar upgoing left equivocal. Gait was not tested.  ASSESSMENT/PLAN [name] is a 70 y.o. male with history of right hemiplegia, diabetes mellitus, seizure disorder, hypertension, chronic kidney disease and depression presenting with L sided weakness and speech difficulty. He did not receive IV t-PA due to being geyond 3 hour time window for treatment.   Possible Stroke/TIA vs recrudescence of prior stroke symptoms  CTA head No significant stenosis   MRI  Will not order as  Unable to lie flat    Carotid Doppler  pending   2D Echo  pending   LDL 78  HgbA1c 5.4  Warfarin and lovenox for VTE prophylaxis Diet Heart Room service appropriate? Yes; Fluid consistency: Thin  warfarin daily prior to admission, now on warfarin daily and full dose lovenox  Patient counseled to be compliant with his antithrombotic medications  Ongoing aggressive stroke risk factor management  Therapy recommendations:  pending   Disposition:  pending   Hypertension  Stable  Long-term BP goal normotensive  Hyperlipidemia  Home meds:  No statin  LDL 78, goal < 70  Add statin, low dose  Continue statin at discharge  Diabetes  HgbA1c 5.4, goal < 7.0  Controlled  Other Stroke Risk Factors  Advanced age  Hx stroke/TIA  9 years ago  Other Active Problems  R leg DVT on coumadin, Pharmacy managing  CKD  Depression  Spasticity secondary to hx stroke. On baclofen at home. Severe today. Unable to lie flat or tolerate MRI  Chest port CXR. Pt warm to touch. Presented with SOB. Will check.  UA normal  On  Keppra at home  Hospital day # 0    I have personally examined this patient, reviewed notes, independently viewed imaging studies, participated in medical decision making and plan of care.ROS completed by me personally and pertinent positives fully documented  I have made any additions or clarifications directly to the above note.  . Patient has a had recurrent DVT despite anticoagulation and presented with new transient left sided weakness probably right hemispheric TIA. CT scan shows no acute abnormality but patient is too uncomfortable at the present time to undergo MRI scan. I had a long discussion the bedside with patient's wife and daughter and Dr Mackie Pai and answered questions. Continue IV heparin   bridging until INR is optimal on warfarin. No further stroke workup is necessary. Stroke team will sign off. Greater than 50% time during this 15 minute visit was spent on counseling and coordination of care about his stroke, risk, prevention and treatment   Delia Heady, MD Medical Director [facility] Pager: [phone] 07/09/2016 2:07 PM  To contact Stroke Continuity provider, please refer  to [email or web address] After hours, contact General Neurology

## 2016-07-09 NOTE — Progress Notes (Signed)
Patient refused CBG check. Encouraged patient and educated him on the importance of checking CBG-patient still refused. Denies pain at this moment

## 2016-07-09 NOTE — Progress Notes (Signed)
CSW consulted with MD and was informed that pt is not ready for d/c due to pending echo. CSW called Gilbert Lewis from [facility] [phone]) and asked if a Sunday admission to facility was possible. Gilbert Lewis states that pt can be admitted tomorrow and that Gilbert Lewis will be the staff member tomorrow to facilitate the transfer.   Gilbert Lewis, MSW, LCSWA Weekend Coverage [phone]

## 2016-07-10 ENCOUNTER — Observation Stay (HOSPITAL_BASED_OUTPATIENT_CLINIC_OR_DEPARTMENT_OTHER): Payer: Medicare Other

## 2016-07-10 DIAGNOSIS — I6789 Other cerebrovascular disease: Secondary | ICD-10-CM

## 2016-07-10 LAB — ECHOCARDIOGRAM COMPLETE
CHL CUP MV DEC (S): 317
E/e' ratio: 6.18
EWDT: 317 ms
FS: 30 % (ref 28–44)
HEIGHTINCHES: 76 in
IV/PV OW: 0.91
LA diam end sys: 36 mm
LA diam index: 1.71 cm/m2
LA vol A4C: 31.6 ml
LA vol index: 20 mL/m2
LA vol: 42.1 mL
LASIZE: 36 mm
LDCA: 3.46 cm2
LV E/e' medial: 6.18
LV E/e'average: 6.18
LV PW d: 11.2 mm — AB (ref 0.6–1.1)
LV TDI E'LATERAL: 9.25
LV TDI E'MEDIAL: 6.74
LV e' LATERAL: 9.25 cm/s
LVOTD: 21 mm
Lateral S' vel: 8.1 cm/s
MV pk A vel: 77.1 m/s
MV pk E vel: 57.2 m/s
RV sys press: 20 mmHg
Reg peak vel: 209 cm/s
TAPSE: 17.5 mm
TR max vel: 209 cm/s
WEIGHTICAEL: 2843.05 [oz_av]

## 2016-07-10 LAB — GLUCOSE, CAPILLARY
Glucose-Capillary: 104 mg/dL — ABNORMAL HIGH (ref 65–99)
Glucose-Capillary: 140 mg/dL — ABNORMAL HIGH (ref 65–99)

## 2016-07-10 LAB — CBC
HCT: 37.4 % — ABNORMAL LOW (ref 39.0–52.0)
Hemoglobin: 12 g/dL — ABNORMAL LOW (ref 13.0–17.0)
MCH: 28.1 pg (ref 26.0–34.0)
MCHC: 32.1 g/dL (ref 30.0–36.0)
MCV: 87.6 fL (ref 78.0–100.0)
Platelets: 224 10*3/uL (ref 150–400)
RBC: 4.27 MIL/uL (ref 4.22–5.81)
RDW: 13.4 % (ref 11.5–15.5)
WBC: 6.5 10*3/uL (ref 4.0–10.5)

## 2016-07-10 LAB — PROTIME-INR
INR: 2.94
Prothrombin Time: 31.2 seconds — ABNORMAL HIGH (ref 11.4–15.2)

## 2016-07-10 MED ORDER — WARFARIN SODIUM 1 MG PO TABS: 1.0000 mg | ORAL_TABLET | Freq: Once | ORAL | 0 refills | Status: DC

## 2016-07-10 MED ORDER — WARFARIN SODIUM 1 MG PO TABS
1.0000 mg | ORAL_TABLET | Freq: Once | ORAL | Status: DC
  Filled 2016-07-10: qty 1

## 2016-07-10 MED ORDER — METOPROLOL TARTRATE 25 MG PO TABS: 12.5000 mg | ORAL_TABLET | Freq: Two times a day (BID) | ORAL | Status: AC

## 2016-07-10 NOTE — Progress Notes (Signed)
ANTICOAGULATION CONSULT NOTE - Follow Up Consult  Pharmacy Consult for: heparin bridge to warfarin Indication: DVT in right leg 07/02/16  Allergies  Allergen Reactions  . Latex Other (See Comments)    Per MAR  . Macrobid [employer][Nitrofurantoin Monohyd Macro] Other (See Comments)    Per MAR  . Morphine And Related Other (See Comments)    [facility]    Patient Measurements: Height: 6\' 4"  (193 cm) Weight: 177 lb 11.1 oz (80.6 kg) IBW/kg (Calculated) : 86.8  Vital Signs: Temp: 98.3 F (36.8 C) (10/08 0929) Temp Source: Oral (10/08 0929) BP: 155/68 (10/08 0929) Pulse Rate: 66 (10/08 0929)  Labs:  Recent Labs  07/08/16 0618 07/08/16 1925 07/09/16 0337 07/10/16 0347  HGB 11.2*  --  10.8* 12.0*  HCT 34.8*  --  34.3* 37.4*  PLT 209  --  212 224  LABPROT 18.6*  --   --  31.2*  INR 1.53  --   --  2.94  HEPARINUNFRC  --  0.45 0.47  --   CREATININE 0.76  --   --   --     Estimated Creatinine Clearance: 100.8 mL/min (by C-G formula based on SCr of 0.76 mg/dL).  Assessment: 70 y.o male on IV heparin bridged to coumadin for recent DVT in right leg 07/02/16.    Coumadin PTA regimen: 7.5mg  daily , last taken PTA on 07/06/16, Admit INR 1.14  INR 2.94 today on enox 40 as well  Patient likely going to SNF today  Goal of Therapy:  INR 2-3 Monitor platelets by anticoagulation protocol: Yes   Plan:  Coumadin 1 mg x1 D/C enoxaparin Daily HL, INR, CBC  At facility, check INR in AM and likely pt should be on 2.5 mg daily, but will need close f/u  Isaac BlissMichael Victorino Fatzinger, PharmD, BCPS, [facility] Clinical Pharmacist Pager [phone] 07/10/2016 1:53 PM

## 2016-07-10 NOTE — Progress Notes (Signed)
[facility]. RN can call report to [phone].   Pt will be discharging via [facility], CSW has already called to arrange transport. Medical necessity  form given to RN.   CSW called pt's daughter [phone]) and informed her of above. Pt's daughter is agreeable with the plan.  Jonathon JordanLynn B Iowa Kappes, MSW, LCSWA Weekend Coverage [phone]

## 2016-07-10 NOTE — Progress Notes (Signed)
  Echocardiogram 2D Echocardiogram has been performed.  Gilbert Lewis, Gilbert Lewis 07/10/2016, 10:16 AM

## 2016-07-10 NOTE — Care Management Obs Status (Signed)
MEDICARE OBSERVATION STATUS NOTIFICATION   Patient Details  Name: Gilbert OpitzBobby Timme MRN: [id number] Date of Birth: 01/30/1946   Medicare Observation Status Notification Given:  Yes  Delivered to daughter Acena via phone and copy previously left at the bedside.     Darcel SmallingAnna C Mackenzie Lia, RN 07/10/2016, 3:31 PM

## 2016-07-10 NOTE — Progress Notes (Signed)
[facility] arrived [facility] Patient is fed, and changed before transporting. Daughter was called as requested to inform her that [facility] arrived to [facility] patient.

## 2016-07-11 ENCOUNTER — Encounter: Payer: Self-pay | Admitting: Adult Health

## 2016-07-11 ENCOUNTER — Non-Acute Institutional Stay (SKILLED_NURSING_FACILITY): Payer: Medicare Other | Admitting: Adult Health

## 2016-07-11 DIAGNOSIS — F329 Major depressive disorder, single episode, unspecified: Secondary | ICD-10-CM | POA: Diagnosis not present

## 2016-07-11 DIAGNOSIS — R350 Frequency of micturition: Secondary | ICD-10-CM

## 2016-07-11 DIAGNOSIS — K219 Gastro-esophageal reflux disease without esophagitis: Secondary | ICD-10-CM | POA: Diagnosis not present

## 2016-07-11 DIAGNOSIS — N401 Enlarged prostate with lower urinary tract symptoms: Secondary | ICD-10-CM

## 2016-07-11 DIAGNOSIS — K5909 Other constipation: Secondary | ICD-10-CM

## 2016-07-11 DIAGNOSIS — I639 Cerebral infarction, unspecified: Secondary | ICD-10-CM | POA: Diagnosis not present

## 2016-07-11 DIAGNOSIS — R569 Unspecified convulsions: Secondary | ICD-10-CM

## 2016-07-11 DIAGNOSIS — K257 Chronic gastric ulcer without hemorrhage or perforation: Secondary | ICD-10-CM

## 2016-07-11 DIAGNOSIS — D509 Iron deficiency anemia, unspecified: Secondary | ICD-10-CM

## 2016-07-11 DIAGNOSIS — G894 Chronic pain syndrome: Secondary | ICD-10-CM

## 2016-07-11 DIAGNOSIS — E785 Hyperlipidemia, unspecified: Secondary | ICD-10-CM

## 2016-07-11 DIAGNOSIS — E1122 Type 2 diabetes mellitus with diabetic chronic kidney disease: Secondary | ICD-10-CM

## 2016-07-11 DIAGNOSIS — G4700 Insomnia, unspecified: Secondary | ICD-10-CM | POA: Diagnosis not present

## 2016-07-11 DIAGNOSIS — I119 Hypertensive heart disease without heart failure: Secondary | ICD-10-CM | POA: Diagnosis not present

## 2016-07-11 DIAGNOSIS — R2681 Unsteadiness on feet: Secondary | ICD-10-CM | POA: Diagnosis not present

## 2016-07-11 DIAGNOSIS — F419 Anxiety disorder, unspecified: Secondary | ICD-10-CM

## 2016-07-11 DIAGNOSIS — N183 Chronic kidney disease, stage 3 (moderate): Secondary | ICD-10-CM

## 2016-07-11 DIAGNOSIS — N138 Other obstructive and reflux uropathy: Secondary | ICD-10-CM

## 2016-07-11 DIAGNOSIS — I69391 Dysphagia following cerebral infarction: Secondary | ICD-10-CM

## 2016-07-11 DIAGNOSIS — K8071 Calculus of gallbladder and bile duct without cholecystitis with obstruction: Secondary | ICD-10-CM

## 2016-07-11 DIAGNOSIS — Z7901 Long term (current) use of anticoagulants: Secondary | ICD-10-CM

## 2016-07-11 DIAGNOSIS — M62838 Other muscle spasm: Secondary | ICD-10-CM

## 2016-07-11 DIAGNOSIS — I82501 Chronic embolism and thrombosis of unspecified deep veins of right lower extremity: Secondary | ICD-10-CM

## 2016-07-11 NOTE — Progress Notes (Signed)
Patient ID: Gilbert Lewis, male   DOB: 03-03-1946, 70 y.o.   MRN: [id number]    DATE:  07/11/2016   MRN:  [id number]  BIRTHDAY: 03-03-1946  Facility:  Nursing Home Location:  [facility]  Nursing Home Room Number: 802-P  LEVEL OF CARE:  SNF [phone]  Contact Information    Name Relation Home Work Mobile   Cislo,Shirley Other [phone]   [facility],Athena Daughter [phone]  [phone]   Bennett,Timothy Other   [phone]       Code Status History    Date Active Date Inactive Code Status Order ID Comments User Context   07/07/2016  9:03 AM 07/10/2016  8:22 PM Full Code [id number]  Ozella Rocksavid J Merrell, MD Inpatient   06/30/2016 10:23 PM 07/03/2016  5:15 PM Full Code [id number]  Pearson GrippeJames Kim, MD Inpatient       Chief Complaint  Patient presents with  . Hospitalization Follow-up    HISTORY OF PRESENT ILLNESS:  This is a 70 year old male who has PMH of CKD, depression, CVA with residual right-sided weakness, DM, retinopathy and HTN. [facility] with stroke-like symptoms., difficulty speaking and left-sided weakness. CT scan initially showed no stroke. Patient was not able to have mri due to spinal surgery and difficulty getting in the scanner. 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 He has stated that his left-sided weakness has resolved but continues to be dysarthric. Neurology and stroke team were consulted.  He has been admitted for a short-term rehabilitation.   PAST MEDICAL HISTORY:  Past Medical History:  Diagnosis Date  . Cataract   . CKD (chronic kidney disease)   . Depression   . Diabetes mellitus without complication (HCC)   . Diabetic retinopathy (HCC)   . Hypertension   . Stroke [facility](HCC)      CURRENT MEDICATIONS: Reviewed  Patient's Medications  New Prescriptions   No  medications on file  Previous Medications   ACETAMINOPHEN (TYLENOL) 325 MG TABLET    Take 650 mg by mouth every 4 (four) hours as needed for mild pain.   AMINO ACIDS-PROTEIN HYDROLYS (FEEDING SUPPLEMENT, PRO-STAT SUGAR FREE 64,) LIQD    Take 30 mLs by mouth 2 (two) times daily.   ASPIRIN 325 MG TABLET    Take 1 tablet (325 mg total) by mouth daily.   ATORVASTATIN (LIPITOR) 10 MG TABLET    Take 1 tablet (10 mg total) by mouth daily at 6 PM.   BACLOFEN (LIORESAL) 10 MG TABLET    Take 10 mg by mouth 3 (three) times daily.   FERROUS SULFATE 325 (65 FE) MG TABLET    Take 325 mg by mouth daily with breakfast.   FINASTERIDE (PROSCAR) 5 MG TABLET    Take 5 mg by mouth every evening.    LEVETIRACETAM (KEPPRA) 250 MG TABLET    Take 250 mg by mouth 2 (two) times daily.   LORAZEPAM (ATIVAN) 0.5 MG TABLET    Take 0.5 tablets (0.25 mg total) by mouth at bedtime.   LOSARTAN (COZAAR) 50 MG TABLET    Take 0.5 tablets (25 mg total) by mouth daily.   MELATONIN 10 MG TABS    Take 10 mg by mouth at bedtime.    METFORMIN (GLUCOPHAGE) 1000 MG TABLET    Take 1,000 mg by mouth 2 (two) times daily with a meal.  METOPROLOL TARTRATE (LOPRESSOR) 25 MG TABLET    Take 0.5 tablets (12.5 mg total) by mouth 2 (two) times daily.   MULTIPLE VITAMINS-MINERALS (DECUBI-VITE) CAPS    Take 1 capsule by mouth daily.   OMEPRAZOLE (PRILOSEC) 20 MG CAPSULE    Take 20 mg by mouth daily.    ONDANSETRON (ZOFRAN) 8 MG TABLET    Take 8 mg by mouth every 8 (eight) hours as needed for nausea or vomiting.    OXYCODONE HCL 10 MG TABS    Take one tablet by mouth every 6 hours as needed for severe pain   POLYETHYLENE GLYCOL (MIRALAX / GLYCOLAX) PACKET    Take 17 g by mouth daily.   SENNA (SENOKOT) 8.6 MG TABLET    Take 2 tablets by mouth 2 (two) times daily.    SERTRALINE (ZOLOFT) 100 MG TABLET    Take 150 mg by mouth daily.   SOLIFENACIN (VESICARE) 5 MG TABLET    Take 5 mg by mouth daily.    SUCRALFATE (CARAFATE) 1 G TABLET    Take 1 g by mouth 4  (four) times daily -  with meals and at bedtime.   TAMSULOSIN (FLOMAX) 0.4 MG CAPS CAPSULE    Take 0.4 mg by mouth daily after supper.   URSODIOL (ACTIGALL) 500 MG TABLET    Take 500 mg by mouth 2 (two) times daily.   VITAMIN B-12 (CYANOCOBALAMIN) 1000 MCG TABLET    Take 1,000 mcg by mouth daily.   WARFARIN (COUMADIN) 1 MG TABLET    Take 1 tablet (1 mg total) by mouth one time only at 6 PM.  Modified Medications   No medications on file  Discontinued Medications   No medications on file     Allergies  Allergen Reactions  . Latex Other (See Comments)    Per MAR  . Macrobid [employer] Macro] Other (See Comments)    Per MAR  . Morphine And Related Other (See Comments)    Per MAR     REVIEW OF SYSTEMS:  Unable to obtain due to dysarthria   PHYSICAL EXAMINATION  GENERAL APPEARANCE: Well nourished. In no acute distress. Normal body habitus SKIN:  Skin is warm and dry.  HEAD: Normal in size and contour. No evidence of trauma EYES: Lids open and close normally. No blepharitis, entropion or ectropion. PERRL. Conjunctivae are clear and sclerae are white. Lenses are without opacity EARS: Pinnae are normal. Patient hears normal voice tunes of the examiner MOUTH and THROAT: Lips are without lesions. Oral mucosa is moist and without lesions. Tongue is normal in shape, size, and color and without lesions NECK: supple, trachea midline, no neck masses, no thyroid tenderness, no thyromegaly LYMPHATICS: no LAN in the neck, no supraclavicular LAN RESPIRATORY: breathing is even & unlabored, BS CTAB CARDIAC: RRR, no murmur,no extra heart sounds, no edema GI: abdomen soft, normal BS, no masses, no tenderness, no hepatomegaly, no splenomegaly EXTREMITIES:  Not able to move RUE and RLE NEUROLOGICAL: Dysarthric speech with nonfluent speech  PSYCHIATRIC: Alert to person only. Agitated and refusing care.  LABS/RADIOLOGY: Labs reviewed: Basic Metabolic Panel:  Recent Labs   07/01/16 0338 07/07/16 0156 07/07/16 0157 07/08/16 0618  NA 134* 137 134* 136  K 3.6 4.2 4.2 4.0  CL 99* 101 100* 103  CO2 27  --  25 27  GLUCOSE 116* 86 92 99  BUN 16 26* 23* 20  CREATININE 0.86 0.90 0.97 0.76  CALCIUM 8.9  --  9.4 9.0  Liver Function Tests:  Recent Labs  06/30/16 1616 07/01/16 0338 07/07/16 0157  AST 18 16 18   ALT 13* 12* 17  ALKPHOS 142* 134* 140*  BILITOT 0.6 0.7 0.6  PROT 6.6 6.2* 6.8  ALBUMIN 3.5 3.3* 3.8   CBC:  Recent Labs  06/30/16 1616  07/07/16 0157 07/08/16 0618 07/09/16 0337 07/10/16 0347  WBC 7.6  < > 9.5 5.7 6.4 6.5  NEUTROABS 4.9  --  5.1  --   --   --   HGB 12.0*  < > 12.4* 11.2* 10.8* 12.0*  HCT 37.1*  < > 38.0* 34.8* 34.3* 37.4*  MCV 87.7  < > 89.0 88.3 88.2 87.6  PLT 253  < > 258 209 212 224  < > = values in this interval not displayed.  Lipid Panel:  Recent Labs  07/01/16 0338  HDL 37*   Cardiac Enzymes:  Recent Labs  06/30/16 1616  TROPONINI <0.03   CBG:  Recent Labs  07/09/16 2056 07/10/16 0623 07/10/16 1122  GLUCAP 149* 104* 140*      Ct Angio Head W/cm &/or Wo Cm  Result Date: 07/07/2016 CLINICAL DATA:  Left-sided weakness.  History of old stroke. EXAM: CT ANGIOGRAPHY HEAD TECHNIQUE: Multidetector CT imaging of the head was performed using the standard protocol during bolus administration of intravenous contrast. Multiplanar CT image reconstructions and MIPs were obtained to evaluate the vascular anatomy. CONTRAST:  50 mL Isovue 370 IV COMPARISON:  Head CT 07/07/2016 FINDINGS: CTA HEAD Hypoattenuation is again seen within the left MCA territory, reflecting remote infarct. Anterior circulation: --Intracranial internal carotid arteries: Normal. --Anterior cerebral arteries: Normal. --Middle cerebral arteries: Normal. --Posterior communicating arteries: Present on the right. Not seen on the left. Posterior circulation: --Posterior cerebral arteries: Normal. --Superior cerebellar arteries: Normal.  --Basilar artery: Normal. --Anterior inferior cerebellar arteries: Not clearly visualize, which is not uncommon. --Posterior inferior cerebellar arteries: Normal. --Vertebral arteries: Right-dominant. The left vertebral artery is hypoplastic and terminates in PICA. There is chunky atherosclerotic calcification at the proximal left V4 segment resulting in at least moderate stenosis. Calcification in the proximal right V4 segment causes at least mild stenosis. Venous sinuses: As permitted by contrast timing, patent. Anatomic variants: Fetal predominant origin of the right posterior cerebral artery. Delayed phase: No parenchymal contrast enhancement. IMPRESSION: 1. No occlusion or high-grade stenosis of the intracranial arteries. 2. Mild atherosclerotic calcification within the proximal V4 segments of the vertebral arteries, with resultant mild-to-moderate stenosis. 3. Left MCA distribution encephalomalacia secondary to remote infarct. Electronically Signed   By: Deatra Robinson M.D.   On: 07/07/2016 13:45   Ct Head Wo Contrast  Result Date: 07/07/2016 CLINICAL DATA:  LEFT-sided weakness, last seen normal at 2300 hours. History of hypertension, diabetes and stroke. EXAM: CT HEAD WITHOUT CONTRAST TECHNIQUE: Contiguous axial images were obtained from the base of the skull through the vertex without intravenous contrast. COMPARISON:  None. FINDINGS: BRAIN: No intraparenchymal hemorrhage, mass effect, midline shift or acute large vascular territory infarcts. LEFT frontotemporal/insular encephalomalacia. Old LEFT basal ganglia and thalamus infarcts with ex vacuo dilatation LEFT lateral ventricle. No hydrocephalus. LEFT basal ganglia mineralization. Patchy to confluent supratentorial white matter hypodensities. Diminutive LEFT cerebral peduncle compatible with wallerian degeneration. No abnormal extra-axial fluid collections. Basal cisterns are patent. VASCULAR: Mild calcific atherosclerosis the carotid siphons. No dense  MCA. SKULL: No skull fracture. No significant scalp soft tissue swelling. SINUSES/ORBITS: The mastoid air-cells and included paranasal sinuses are well-aerated.The included ocular globes and orbital contents are non-suspicious. OTHER: None.  IMPRESSION: No acute intracranial process. Old moderate LEFT MCA territory infarct. Moderate to severe white matter changes compatible with chronic small vessel ischemic disease. Acute findings discussed with and reconfirmed by Dr.Stewart, Neurology on 07/07/2016 at 2:19 am. Electronically Signed   By: Awilda Metro M.D.   On: 07/07/2016 02:20   Ct Angio Chest Pe W And/or Wo Contrast  Result Date: 06/30/2016 CLINICAL DATA:  70 year old presenting from a nursing home with lower extremity edema and shortness of breath. By report, ultrasound performed at the facility demonstrates DVT. EXAM: CT ANGIOGRAPHY CHEST WITH CONTRAST TECHNIQUE: Multidetector CT imaging of the chest was performed using the standard protocol during bolus administration of intravenous contrast. Multiplanar CT image reconstructions and MIPs were obtained to evaluate the vascular anatomy. CONTRAST:  100 mL Isovue 370 IV. COMPARISON:  None. FINDINGS: Technical quality: Adequate. Respiratory motion blurs images of the lung bases. As the patient was unable to raise the arms, beam hardening streak artifact is present. Cardiovascular: No filling defects within either main pulmonary artery or their branches in either lung to suggest pulmonary embolism. Heart size upper normal to slightly enlarged. No pericardial effusion. Mild LAD coronary atherosclerosis. Compression of the right atrium by the elevated right hemidiaphragm. Thoracic and upper abdominal aorta tortuous and mildly atherosclerotic without evidence of aneurysm or dissection. Proximal great vessels widely patent with mild atherosclerosis. Mediastinum/Nodes: No pathologically enlarged mediastinal, hilar or axillary lymph nodes. No mediastinal masses.  Normal-appearing esophagus. Thyroid gland normal in size containing a 7 mm nodule in the lower pole of the right lobe. Lungs/Pleura: Scar/atelectasis involving the right lower lobe due to the elevated right hemidiaphragm. Mild dependent atelectasis posteriorly in both lower lobes, as expected. Lungs otherwise clear. No pulmonary parenchymal nodules or masses. No pleural effusions. Central airways patent without significant bronchial wall thickening. Upper Abdomen: Large stool burden throughout the visualized colon. Interposition of the hepatic flexure the colon between the liver and the right hemidiaphragm. Likely benign complex cysts arising from the upper pole and mid right kidney. Musculoskeletal: Osseous demineralization. 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 Exaggeration of the usual thoracic kyphosis. Review of the MIP images confirms the above findings. IMPRESSION: 1. No evidence of pulmonary embolism. 2. Borderline heart size. Mild LAD coronary atherosclerosis. Mild thoracic and upper abdominal aortic atherosclerosis. 3. Elevation of the right hemidiaphragm with scar/atelectasis involving the right lower lobe. No acute cardiopulmonary disease otherwise. 4. Likely benign complex cysts involving the upper pole and mid right kidney. 5. Large stool burden throughout the visualized colon. 6. 7 mm right lobe thyroid nodule which does not require further imaging follow-up. Electronically Signed   By: Hulan Saas M.D.   On: 06/30/2016 18:33   Dg Chest Port 1 View  Result Date: 07/07/2016 CLINICAL DATA:  Shortness of breath, weakness, fatigue EXAM: PORTABLE CHEST 1 VIEW COMPARISON:  CT chest of 06/30/2016 FINDINGS: No active infiltrate or effusion is seen. The lungs are not optimally aerated. Mediastinal and hilar contours are unremarkable. The heart is borderline enlarged. There are degenerative changes throughout the thoracic spine. IMPRESSION: Suboptimal inspiration. No definite active process.  Mild cardiomegaly. Electronically Signed   By: Dwyane Dee M.D.   On: 07/07/2016 13:57   Dg Swallowing Func-speech Pathology  Result Date: 07/07/2016 Objective Swallowing Evaluation: Type of Study: MBS-Modified Barium Swallow Study Patient Details Name: Osborne Serio MRN: [id number] Date of Birth: [birth year] Today's Date: 07/07/2016 Time: SLP Start Time (ACUTE ONLY): 1147-SLP Stop Time (ACUTE ONLY): 1206 SLP Time Calculation (min) (ACUTE ONLY): 19  min Past Medical History: Past Medical History: Diagnosis Date . Cataract  . CKD (chronic kidney disease)  . Depression  . Diabetes mellitus without complication (HCC)  . Diabetic retinopathy (HCC)  . Hypertension  . Stroke [facility])  Past Surgical History: No past surgical history on file. HPI: 70 y.o.malewith medical history significant of CKD, depression, CVA with residual right-sided weakness, DM, retinopathy, HTN, presenting with difficulty speaking and left-sided weakness. Head CT showed old moderate LEFT MCA territory infarct and no acute infarct. Pt failed SSS due to h/o dysphagia. Subjective: pt alert and cooperative Assessment / Plan / Recommendation CHL IP CLINICAL IMPRESSIONS 07/07/2016 Therapy Diagnosis Mild pharyngeal phase dysphagia Clinical Impression Pt presents with mild pharyngeal dysphagia. He displayed no events of aspiration or penetration. Pt had a mild delay in swallow initiation to the valleculae for pureed and regular solids. Following all PO trials there was trace vallecular residue observed, and UES residue was seen with thin liquid trials. Recommend regular diet and thin liquids. Given pt's performance, h/o stroke, and anatomical differences (dx of DISH per care everywhere), SLP will s/o given pt's swallow function is likely at baseline. Impact on safety and function Mild aspiration risk   CHL IP TREATMENT RECOMMENDATION 07/07/2016 Treatment Recommendations No treatment recommended at this time   Prognosis 07/07/2016 Prognosis for Safe Diet  Advancement Good Barriers to Reach Goals Cognitive deficits Barriers/Prognosis Comment -- CHL IP DIET RECOMMENDATION 07/07/2016 SLP Diet Recommendations Regular solids;Thin liquid Liquid Administration via Cup;Straw Medication Administration Whole meds with liquid Compensations Minimize environmental distractions;Slow rate;Small sips/bites Postural Changes Remain semi-upright after after feeds/meals (Comment);Seated upright at 90 degrees   CHL IP OTHER RECOMMENDATIONS 07/07/2016 Recommended Consults -- Oral Care Recommendations Oral care BID Other Recommendations --   CHL IP FOLLOW UP RECOMMENDATIONS 07/07/2016 Follow up Recommendations Skilled Nursing facility;24 hour supervision/assistance   CHL IP FREQUENCY AND DURATION 07/07/2016 Speech Therapy Frequency (ACUTE ONLY) min 2x/week Treatment Duration --      CHL IP ORAL PHASE 07/07/2016 Oral Phase WFL Oral - Pudding Teaspoon -- Oral - Pudding Cup -- Oral - Honey Teaspoon -- Oral - Honey Cup -- Oral - Nectar Teaspoon -- Oral - Nectar Cup -- Oral - Nectar Straw -- Oral - Thin Teaspoon -- Oral - Thin Cup -- Oral - Thin Straw -- Oral - Puree -- Oral - Mech Soft -- Oral - Regular -- Oral - Multi-Consistency -- Oral - Pill -- Oral Phase - Comment --  CHL IP PHARYNGEAL PHASE 07/07/2016 Pharyngeal Phase Impaired Pharyngeal- Pudding Teaspoon -- Pharyngeal -- Pharyngeal- Pudding Cup -- Pharyngeal -- Pharyngeal- Honey Teaspoon -- Pharyngeal -- Pharyngeal- Honey Cup -- Pharyngeal -- Pharyngeal- Nectar Teaspoon -- Pharyngeal -- Pharyngeal- Nectar Cup -- Pharyngeal -- Pharyngeal- Nectar Straw -- Pharyngeal -- Pharyngeal- Thin Teaspoon -- Pharyngeal -- Pharyngeal- Thin Cup Pharyngeal residue - valleculae;Pharyngeal residue - cp segment Pharyngeal -- Pharyngeal- Thin Straw Pharyngeal residue - valleculae Pharyngeal -- Pharyngeal- Puree Delayed swallow initiation-vallecula;Pharyngeal residue - valleculae Pharyngeal -- Pharyngeal- Mechanical Soft -- Pharyngeal -- Pharyngeal- Regular  Delayed swallow initiation-vallecula;Pharyngeal residue - valleculae Pharyngeal -- Pharyngeal- Multi-consistency -- Pharyngeal -- Pharyngeal- Pill WFL Pharyngeal -- Pharyngeal Comment --  CHL IP CERVICAL ESOPHAGEAL PHASE 07/07/2016 Cervical Esophageal Phase Impaired Pudding Teaspoon -- Pudding Cup -- Honey Teaspoon -- Honey Cup -- Nectar Teaspoon -- Nectar Cup -- Nectar Straw -- Thin Teaspoon -- Thin Cup Reduced cricopharyngeal relaxation Thin Straw Reduced cricopharyngeal relaxation Puree Reduced cricopharyngeal relaxation Mechanical Soft -- Regular Reduced cricopharyngeal relaxation Multi-consistency -- Pill Reduced cricopharyngeal  relaxation Cervical Esophageal Comment -- CHL IP GO 07/07/2016 Functional Assessment Tool Used skilled clinical judgment Functional Limitations Swallowing Swallow Current Status (Z6109) CI Swallow Goal Status (U0454) CI Swallow Discharge Status (U9811) CI Motor Speech Current Status (B1478) (None) Motor Speech Goal Status (G9562) (None) Motor Speech Goal Status (Z3086) (None) Spoken Language Comprehension Current Status (V7846) (None) Spoken Language Comprehension Goal Status (N6295) (None) Spoken Language Comprehension Discharge Status [phone]) (None) Spoken Language Expression Current Status [phone]) (None) Spoken Language Expression Goal Status (U2725) (None) Spoken Language Expression Discharge Status [phone]) (None) Attention Current Status (I3474) (None) Attention Goal Status (Q5956) (None) Attention Discharge Status (L8756) (None) Memory Current Status (E3329) (None) Memory Goal Status (J1884) (None) Memory Discharge Status (Z6606) (None) Voice Current Status (T0160) (None) Voice Goal Status (F0932) (None) Voice Discharge Status (T5573) (None) Other Speech-Language Pathology Functional Limitation [phone]) (None) Other Speech-Language Pathology Functional Limitation Goal Status (K2706) (None) Other Speech-Language Pathology Functional Limitation Discharge Status [phone]) (None)  Maxcine Ham 07/07/2016, 12:54 PM  Note populated for Jearl Klinefelter, student SLP Maxcine Ham, M.A. CCC-SLP [phone]              ASSESSMENT/PLAN:  Unsteady gait - for rehabilitation, PT and OT, for therapeutic strengthening exercises; fall precaution  CVA - had left MCA stroke a month ago; for rehabilitation, PT and OT, for therapeutic strengthening exercises; continue ASA 325 mg daily  GERD - continue Omeprazole 20 mg 1 capsule PO Q D  Hypertension - continue Metoprolol tartrate 25 mg give 1/2 tab = 12.5 mg PO BID, Cozaar 50 mg 1/2 tab = 25 mg PO Q D; check BMP  Constipation - continue Miralax 17 gm PO Q D  Urinary frequency - continue Vesicare 5 mg 1 tab PO Q D  Depression - continue Zoloft 100 mg 1 tab PO Q D  BPH - continue Flomax 0.4 mg 1 capsule PO Q D and Proscar 5 mg 1 tab PO Q HS  Normocytic anemia - discontinue FeSO4; check CBC Lab Results  Component Value Date   HGB 12.0 (L) 07/10/2016   Anxiety - continue Ativan 0.5 mg give 1/2 tab = 0.25 mg PO Q HS  Insomnia - continue Melatonin 10 mg 1 capsule PO Q HS  Hyperlipidemia - continue Lipitor 10 mg 1 tab PO QD Lab Results  Component Value Date   CHOL 127 07/01/2016   HDL 37 (L) 07/01/2016   LDLCALC 78 07/01/2016   TRIG 59 07/01/2016   CHOLHDL 3.4 07/01/2016    Right leg DVT - continue Coumadin 1 mg 1 tab PO Q D  Seizure - continue Keppra 250 mg 1 tab PO BID  Constipation - continue Senna 8.6 mg 2 tabs PO BID  Diabetes mellitus, type 2 - continue Glucophage 1,000 mg 1 tab PO BID Lab Results  Component Value Date   HGBA1C 5.4 07/01/2016    Gallbladder stone - continue Ursodiol 500 mg 1 tab PO BID  Chronic gastric ulcer - continue Carafate 1 gm PO QID  Chronic pain syndrome - continue Oxycodone 10 mg 1 tab PO Q 6 hours PRN; physiatry consult  Muscle spasm - continue Baclofen 10 mg 1 tab PO TID  Long-term use of anticoagulant - INR 3.0; continue Coumadin 1 mg PO Q D; INR on  07/14/16  Dysphagia - for SLP evaluation and treatment of swallowing function; aspiration precaution     Goals of care:  Short-term rehabilitation    Kenard Gower, NP [facility] [phone]

## 2016-07-12 ENCOUNTER — Encounter: Payer: Self-pay | Admitting: Internal Medicine

## 2016-07-12 ENCOUNTER — Other Ambulatory Visit: Payer: Self-pay | Admitting: *Deleted

## 2016-07-12 ENCOUNTER — Non-Acute Institutional Stay (SKILLED_NURSING_FACILITY): Payer: Medicare Other | Admitting: Internal Medicine

## 2016-07-12 ENCOUNTER — Other Ambulatory Visit: Payer: Self-pay

## 2016-07-12 DIAGNOSIS — K5909 Other constipation: Secondary | ICD-10-CM

## 2016-07-12 DIAGNOSIS — N401 Enlarged prostate with lower urinary tract symptoms: Secondary | ICD-10-CM | POA: Diagnosis not present

## 2016-07-12 DIAGNOSIS — N183 Chronic kidney disease, stage 3 unspecified: Secondary | ICD-10-CM

## 2016-07-12 DIAGNOSIS — N138 Other obstructive and reflux uropathy: Secondary | ICD-10-CM

## 2016-07-12 DIAGNOSIS — F329 Major depressive disorder, single episode, unspecified: Secondary | ICD-10-CM | POA: Diagnosis not present

## 2016-07-12 DIAGNOSIS — I69320 Aphasia following cerebral infarction: Secondary | ICD-10-CM | POA: Diagnosis not present

## 2016-07-12 DIAGNOSIS — R561 Post traumatic seizures: Secondary | ICD-10-CM

## 2016-07-12 DIAGNOSIS — E440 Moderate protein-calorie malnutrition: Secondary | ICD-10-CM | POA: Diagnosis not present

## 2016-07-12 DIAGNOSIS — R531 Weakness: Secondary | ICD-10-CM

## 2016-07-12 DIAGNOSIS — G8111 Spastic hemiplegia affecting right dominant side: Secondary | ICD-10-CM | POA: Diagnosis not present

## 2016-07-12 DIAGNOSIS — I69391 Dysphagia following cerebral infarction: Secondary | ICD-10-CM | POA: Diagnosis not present

## 2016-07-12 DIAGNOSIS — E1122 Type 2 diabetes mellitus with diabetic chronic kidney disease: Secondary | ICD-10-CM | POA: Diagnosis not present

## 2016-07-12 DIAGNOSIS — G459 Transient cerebral ischemic attack, unspecified: Secondary | ICD-10-CM

## 2016-07-12 DIAGNOSIS — E785 Hyperlipidemia, unspecified: Secondary | ICD-10-CM

## 2016-07-12 DIAGNOSIS — K219 Gastro-esophageal reflux disease without esophagitis: Secondary | ICD-10-CM

## 2016-07-12 DIAGNOSIS — I119 Hypertensive heart disease without heart failure: Secondary | ICD-10-CM

## 2016-07-12 DIAGNOSIS — N3281 Overactive bladder: Secondary | ICD-10-CM

## 2016-07-12 DIAGNOSIS — I824Y1 Acute embolism and thrombosis of unspecified deep veins of right proximal lower extremity: Secondary | ICD-10-CM | POA: Diagnosis not present

## 2016-07-12 MED ORDER — LORAZEPAM 0.5 MG PO TABS: 0.2500 mg | ORAL_TABLET | Freq: Every day | ORAL | 0 refills | Status: DC

## 2016-07-12 MED ORDER — OXYCODONE HCL 10 MG PO TABS: ORAL_TABLET | ORAL | 0 refills | Status: DC

## 2016-07-12 NOTE — Telephone Encounter (Signed)
[facility] #[phone] Fax: [phone] 

## 2016-07-12 NOTE — Progress Notes (Signed)
LOCATION: [facility]  PCP: Oneal Grout, MD   Code Status: Full Code  Goals of care: Advanced Directive information Advanced Directives 07/07/2016  Does patient have an advance directive? No  Would patient like information on creating an advanced directive? -       Extended Emergency Contact Information Primary Emergency Contact: Hauth,Shirley Address: [address]          [place], [place] [address] Darden Amber of [place] Home Phone: [phone] Work Phone: [phone] Mobile Phone: [phone] Relation: Other Secondary Emergency Contact: Everitt Amber Address: [address]          [place], [place] [address] Darden Amber of [place] Home Phone: [phone] Mobile Phone: [phone] Relation: Daughter   Allergies  Allergen Reactions  . Latex Other (See Comments)    Per MAR  . Macrobid [employer] Macro] Other (See Comments)    Per MAR  . Morphine And Related Other (See Comments)    Per [facility]    Chief Complaint  Patient presents with  . New Admit To SNF    New Admission Visit     HPI:  Patient is a 70 y.o. male seen today for long term care post hospital admission from 07/07/16-07/10/16 with new left sided weakness with concern for possible TIA. CT head did not show acute cva changes. He was seen by neurology and placed on aspirin and statin. He also had new DVT and is now off xarelto na don coumadin. He has PMH of stroke, aphasia, hypertensive heart disease, post traumatic seizures, chronic pain syndrome, iron def anemia and gastric ulcer among others. He is seen in his room today. He participates some in HPI and ROS.      Review of Systems:  Constitutional: Negative for fever HENT: Negative for headache, congestion, nasal discharge Eyes: Negative for eye pain  Respiratory: Negative for cough and dyspnea.  Cardiovascular: Negative for chest pain, palpitations, leg swelling.  Gastrointestinal: Negative for heartburn,  vomiting, abdominal pain. Positive for nausea.   Genitourinary: Negative for dysuria. Musculoskeletal: Negative for back pain, fall in the facility.  Skin: Negative for itching, rash.  Neurological: Negative for dizziness.    Past Medical History:  Diagnosis Date  . Cataract   . CKD (chronic kidney disease)   . Depression   . Diabetes mellitus without complication (HCC)   . Diabetic retinopathy (HCC)   . Hypertension   . Stroke [facility])    No past surgical history on file. Social History:   reports that he has never smoked. He has never used smokeless tobacco. He reports that he does not drink alcohol or use drugs.  Family History  Problem Relation Age of Onset  . Family history unknown: Yes    Medications:   Medication List       Accurate as of 07/12/16  2:34 PM. Always use your most recent med list.          acetaminophen 325 MG tablet Commonly known as:  TYLENOL Take 650 mg by mouth every 4 (four) hours as needed for mild pain.   aspirin 325 MG tablet Take 1 tablet (325 mg total) by mouth daily.   atorvastatin 10 MG tablet Commonly known as:  LIPITOR Take 1 tablet (10 mg total) by mouth daily at 6 PM.   baclofen 10 MG tablet Commonly known as:  LIORESAL Take 10 mg by mouth 3 (three) times daily.   DECUBI-VITE Caps Take 1 capsule by mouth daily.   feeding  supplement (PRO-STAT SUGAR FREE 64) Liqd Take 30 mLs by mouth 2 (two) times daily.   finasteride 5 MG tablet Commonly known as:  PROSCAR Take 5 mg by mouth every evening.   levETIRAcetam 250 MG tablet Commonly known as:  KEPPRA Take 250 mg by mouth 2 (two) times daily.   LORazepam 0.5 MG tablet Commonly known as:  ATIVAN Take 0.5 tablets (0.25 mg total) by mouth at bedtime.   losartan 50 MG tablet Commonly known as:  COZAAR Take 0.5 tablets (25 mg total) by mouth daily.   Melatonin 10 MG Tabs Take 10 mg by mouth at bedtime.   metFORMIN 1000 MG tablet Commonly known as:  GLUCOPHAGE Take  1,000 mg by mouth 2 (two) times daily with a meal.   metoprolol tartrate 25 MG tablet Commonly known as:  LOPRESSOR Take 0.5 tablets (12.5 mg total) by mouth 2 (two) times daily.   omeprazole 20 MG capsule Commonly known as:  PRILOSEC Take 20 mg by mouth daily.   ondansetron 8 MG tablet Commonly known as:  ZOFRAN Take 8 mg by mouth every 8 (eight) hours as needed for nausea or vomiting.   Oxycodone HCl 10 MG Tabs Take one tablet by mouth every 6 hours as needed for severe pain   polyethylene glycol packet Commonly known as:  MIRALAX / GLYCOLAX Take 17 g by mouth daily.   senna 8.6 MG tablet Commonly known as:  SENOKOT Take 2 tablets by mouth 2 (two) times daily.   sertraline 100 MG tablet Commonly known as:  ZOLOFT Take 150 mg by mouth daily.   solifenacin 5 MG tablet Commonly known as:  VESICARE Take 5 mg by mouth daily.   sucralfate 1 g tablet Commonly known as:  CARAFATE Take 1 g by mouth 4 (four) times daily -  with meals and at bedtime.   tamsulosin 0.4 MG Caps capsule Commonly known as:  FLOMAX Take 0.4 mg by mouth daily after supper.   ursodiol 500 MG tablet Commonly known as:  ACTIGALL Take 500 mg by mouth 2 (two) times daily.   vitamin B-12 1000 MCG tablet Commonly known as:  CYANOCOBALAMIN Take 1,000 mcg by mouth daily.   warfarin 1 MG tablet Commonly known as:  COUMADIN Take 1 tablet (1 mg total) by mouth one time only at 6 PM.       Immunizations: Immunization History  Administered Date(s) Administered  . Influenza-Unspecified 07/15/2015  . Pneumococcal-Unspecified 09/02/2013     Physical Exam: Vitals:   07/12/16 1426  BP: (!) 154/79  Pulse: 68  Resp: 18  Temp: 98.6 F (37 C)  TempSrc: Oral  SpO2: 98%  Weight: 177 lb 9.6 oz (80.6 kg)  Height: 6\' 4"  (1.93 m)   Body mass index is 21.62 kg/m.   General- elderly male, frail, in no acute distress Head- normocephalic, atraumatic Nose- no nasal discharge Throat- moist mucus  membrane Eyes- PERRLA, EOMI, no pallor, no icterus, no discharge, normal conjunctiva, normal sclera Neck- no cervical lymphadenopathy Cardiovascular- normal s1,s2, + systolic murmur Respiratory- bilateral clear to auscultation, no wheeze, no rhonchi, no crackles, no use of accessory muscles Abdomen- bowel sounds present, soft, non tender Musculoskeletal- right sided hemiplegia present, on wheelchair, right sided spasticity present Neurological- alert and oriented to self only Skin- warm and dry Psychiatry- normal mood and affect    Labs reviewed: Basic Metabolic Panel:  Recent Labs  16/10/96 0338 07/07/16 0156 07/07/16 0157 07/08/16 0618  NA 134* 137 134* 136  K 3.6  4.2 4.2 4.0  CL 99* 101 100* 103  CO2 27  --  25 27  GLUCOSE 116* 86 92 99  BUN 16 26* 23* 20  CREATININE 0.86 0.90 0.97 0.76  CALCIUM 8.9  --  9.4 9.0   Liver Function Tests:  Recent Labs  06/30/16 1616 07/01/16 0338 07/07/16 0157  AST 18 16 18   ALT 13* 12* 17  ALKPHOS 142* 134* 140*  BILITOT 0.6 0.7 0.6  PROT 6.6 6.2* 6.8  ALBUMIN 3.5 3.3* 3.8   No results for input(s): LIPASE, AMYLASE in the last 8760 hours. No results for input(s): AMMONIA in the last 8760 hours. CBC:  Recent Labs  06/30/16 1616  07/07/16 0157 07/08/16 0618 07/09/16 0337 07/10/16 0347  WBC 7.6  < > 9.5 5.7 6.4 6.5  NEUTROABS 4.9  --  5.1  --   --   --   HGB 12.0*  < > 12.4* 11.2* 10.8* 12.0*  HCT 37.1*  < > 38.0* 34.8* 34.3* 37.4*  MCV 87.7  < > 89.0 88.3 88.2 87.6  PLT 253  < > 258 209 212 224  < > = values in this interval not displayed. Cardiac Enzymes:  Recent Labs  06/30/16 1616  TROPONINI <0.03   BNP: Invalid input(s): POCBNP CBG:  Recent Labs  07/09/16 2056 07/10/16 0623 07/10/16 1122  GLUCAP 149* 104* 140*    Radiological Exams: Ct Angio Head W/cm &/or Wo Cm  Result Date: 07/07/2016 CLINICAL DATA:  Left-sided weakness.  History of old stroke. EXAM: CT ANGIOGRAPHY HEAD TECHNIQUE: Multidetector  CT imaging of the head was performed using the standard protocol during bolus administration of intravenous contrast. Multiplanar CT image reconstructions and MIPs were obtained to evaluate the vascular anatomy. CONTRAST:  50 mL Isovue 370 IV COMPARISON:  Head CT 07/07/2016 FINDINGS: CTA HEAD Hypoattenuation is again seen within the left MCA territory, reflecting remote infarct. Anterior circulation: --Intracranial internal carotid arteries: Normal. --Anterior cerebral arteries: Normal. --Middle cerebral arteries: Normal. --Posterior communicating arteries: Present on the right. Not seen on the left. Posterior circulation: --Posterior cerebral arteries: Normal. --Superior cerebellar arteries: Normal. --Basilar artery: Normal. --Anterior inferior cerebellar arteries: Not clearly visualize, which is not uncommon. --Posterior inferior cerebellar arteries: Normal. --Vertebral arteries: Right-dominant. The left vertebral artery is hypoplastic and terminates in PICA. There is chunky atherosclerotic calcification at the proximal left V4 segment resulting in at least moderate stenosis. Calcification in the proximal right V4 segment causes at least mild stenosis. Venous sinuses: As permitted by contrast timing, patent. Anatomic variants: Fetal predominant origin of the right posterior cerebral artery. Delayed phase: No parenchymal contrast enhancement. IMPRESSION: 1. No occlusion or high-grade stenosis of the intracranial arteries. 2. Mild atherosclerotic calcification within the proximal V4 segments of the vertebral arteries, with resultant mild-to-moderate stenosis. 3. Left MCA distribution encephalomalacia secondary to remote infarct. Electronically Signed   By: Deatra RobinsonKevin  Herman M.D.   On: 07/07/2016 13:45   Ct Head Wo Contrast  Result Date: 07/07/2016 CLINICAL DATA:  LEFT-sided weakness, last seen normal at 2300 hours. History of hypertension, diabetes and stroke. EXAM: CT HEAD WITHOUT CONTRAST TECHNIQUE: Contiguous  axial images were obtained from the base of the skull through the vertex without intravenous contrast. COMPARISON:  None. FINDINGS: BRAIN: No intraparenchymal hemorrhage, mass effect, midline shift or acute large vascular territory infarcts. LEFT frontotemporal/insular encephalomalacia. Old LEFT basal ganglia and thalamus infarcts with ex vacuo dilatation LEFT lateral ventricle. No hydrocephalus. LEFT basal ganglia mineralization. Patchy to confluent supratentorial white matter hypodensities. Diminutive  LEFT cerebral peduncle compatible with wallerian degeneration. No abnormal extra-axial fluid collections. Basal cisterns are patent. VASCULAR: Mild calcific atherosclerosis the carotid siphons. No dense MCA. SKULL: No skull fracture. No significant scalp soft tissue swelling. SINUSES/ORBITS: The mastoid air-cells and included paranasal sinuses are well-aerated.The included ocular globes and orbital contents are non-suspicious. OTHER: None. IMPRESSION: No acute intracranial process. Old moderate LEFT MCA territory infarct. Moderate to severe white matter changes compatible with chronic small vessel ischemic disease. Acute findings discussed with and reconfirmed by Dr.Stewart, Neurology on 07/07/2016 at 2:19 am. Electronically Signed   By: Awilda Metro M.D.   On: 07/07/2016 02:20   Ct Angio Chest Pe W And/or Wo Contrast  Result Date: 06/30/2016 CLINICAL DATA:  70 year old presenting from a nursing home with lower extremity edema and shortness of breath. By report, ultrasound performed at the facility demonstrates DVT. EXAM: CT ANGIOGRAPHY CHEST WITH CONTRAST TECHNIQUE: Multidetector CT imaging of the chest was performed using the standard protocol during bolus administration of intravenous contrast. Multiplanar CT image reconstructions and MIPs were obtained to evaluate the vascular anatomy. CONTRAST:  100 mL Isovue 370 IV. COMPARISON:  None. FINDINGS: Technical quality: Adequate. Respiratory motion blurs  images of the lung bases. As the patient was unable to raise the arms, beam hardening streak artifact is present. Cardiovascular: No filling defects within either main pulmonary artery or their branches in either lung to suggest pulmonary embolism. Heart size upper normal to slightly enlarged. No pericardial effusion. Mild LAD coronary atherosclerosis. Compression of the right atrium by the elevated right hemidiaphragm. Thoracic and upper abdominal aorta tortuous and mildly atherosclerotic without evidence of aneurysm or dissection. Proximal great vessels widely patent with mild atherosclerosis. Mediastinum/Nodes: No pathologically enlarged mediastinal, hilar or axillary lymph nodes. No mediastinal masses. Normal-appearing esophagus. Thyroid gland normal in size containing a 7 mm nodule in the lower pole of the right lobe. Lungs/Pleura: Scar/atelectasis involving the right lower lobe due to the elevated right hemidiaphragm. Mild dependent atelectasis posteriorly in both lower lobes, as expected. Lungs otherwise clear. No pulmonary parenchymal nodules or masses. No pleural effusions. Central airways patent without significant bronchial wall thickening. Upper Abdomen: Large stool burden throughout the visualized colon. Interposition of the hepatic flexure the colon between the liver and the right hemidiaphragm. Likely benign complex cysts arising from the upper pole and mid right kidney. Musculoskeletal: Osseous demineralization. 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 Exaggeration of the usual thoracic kyphosis. Review of the MIP images confirms the above findings. IMPRESSION: 1. No evidence of pulmonary embolism. 2. Borderline heart size. Mild LAD coronary atherosclerosis. Mild thoracic and upper abdominal aortic atherosclerosis. 3. Elevation of the right hemidiaphragm with scar/atelectasis involving the right lower lobe. No acute cardiopulmonary disease otherwise. 4. Likely benign complex cysts involving  the upper pole and mid right kidney. 5. Large stool burden throughout the visualized colon. 6. 7 mm right lobe thyroid nodule which does not require further imaging follow-up. Electronically Signed   By: Hulan Saas M.D.   On: 06/30/2016 18:33   Dg Chest Port 1 View  Result Date: 07/07/2016 CLINICAL DATA:  Shortness of breath, weakness, fatigue EXAM: PORTABLE CHEST 1 VIEW COMPARISON:  CT chest of 06/30/2016 FINDINGS: No active infiltrate or effusion is seen. The lungs are not optimally aerated. Mediastinal and hilar contours are unremarkable. The heart is borderline enlarged. There are degenerative changes throughout the thoracic spine. IMPRESSION: Suboptimal inspiration. No definite active process. Mild cardiomegaly. Electronically Signed   By: Lucienne Minks.D.  On: 07/07/2016 13:57   Dg Swallowing Func-speech Pathology  Result Date: 07/07/2016 Objective Swallowing Evaluation: Type of Study: MBS-Modified Barium Swallow Study Patient Details Name: Biran Mayberry MRN: [id number] Date of Birth: 1946-01-04 Today's Date: 07/07/2016 Time: SLP Start Time (ACUTE ONLY): 1147-SLP Stop Time (ACUTE ONLY): 1206 SLP Time Calculation (min) (ACUTE ONLY): 19 min Past Medical History: Past Medical History: Diagnosis Date . Cataract  . CKD (chronic kidney disease)  . Depression  . Diabetes mellitus without complication (HCC)  . Diabetic retinopathy (HCC)  . Hypertension  . Stroke [facility])  Past Surgical History: No past surgical history on file. HPI: 70 y.o.malewith medical history significant of CKD, depression, CVA with residual right-sided weakness, DM, retinopathy, HTN, presenting with difficulty speaking and left-sided weakness. Head CT showed old moderate LEFT MCA territory infarct and no acute infarct. Pt failed SSS due to h/o dysphagia. Subjective: pt alert and cooperative Assessment / Plan / Recommendation CHL IP CLINICAL IMPRESSIONS 07/07/2016 Therapy Diagnosis Mild pharyngeal phase dysphagia Clinical Impression Pt  presents with mild pharyngeal dysphagia. He displayed no events of aspiration or penetration. Pt had a mild delay in swallow initiation to the valleculae for pureed and regular solids. Following all PO trials there was trace vallecular residue observed, and UES residue was seen with thin liquid trials. Recommend regular diet and thin liquids. Given pt's performance, h/o stroke, and anatomical differences (dx of DISH per care everywhere), SLP will s/o given pt's swallow function is likely at baseline. Impact on safety and function Mild aspiration risk   CHL IP TREATMENT RECOMMENDATION 07/07/2016 Treatment Recommendations No treatment recommended at this time   Prognosis 07/07/2016 Prognosis for Safe Diet Advancement Good Barriers to Reach Goals Cognitive deficits Barriers/Prognosis Comment -- CHL IP DIET RECOMMENDATION 07/07/2016 SLP Diet Recommendations Regular solids;Thin liquid Liquid Administration via Cup;Straw Medication Administration Whole meds with liquid Compensations Minimize environmental distractions;Slow rate;Small sips/bites Postural Changes Remain semi-upright after after feeds/meals (Comment);Seated upright at 90 degrees   CHL IP OTHER RECOMMENDATIONS 07/07/2016 Recommended Consults -- Oral Care Recommendations Oral care BID Other Recommendations --   CHL IP FOLLOW UP RECOMMENDATIONS 07/07/2016 Follow up Recommendations Skilled Nursing facility;24 hour supervision/assistance   CHL IP FREQUENCY AND DURATION 07/07/2016 Speech Therapy Frequency (ACUTE ONLY) min 2x/week Treatment Duration --      CHL IP ORAL PHASE 07/07/2016 Oral Phase WFL Oral - Pudding Teaspoon -- Oral - Pudding Cup -- Oral - Honey Teaspoon -- Oral - Honey Cup -- Oral - Nectar Teaspoon -- Oral - Nectar Cup -- Oral - Nectar Straw -- Oral - Thin Teaspoon -- Oral - Thin Cup -- Oral - Thin Straw -- Oral - Puree -- Oral - Mech Soft -- Oral - Regular -- Oral - Multi-Consistency -- Oral - Pill -- Oral Phase - Comment --  CHL IP PHARYNGEAL PHASE  07/07/2016 Pharyngeal Phase Impaired Pharyngeal- Pudding Teaspoon -- Pharyngeal -- Pharyngeal- Pudding Cup -- Pharyngeal -- Pharyngeal- Honey Teaspoon -- Pharyngeal -- Pharyngeal- Honey Cup -- Pharyngeal -- Pharyngeal- Nectar Teaspoon -- Pharyngeal -- Pharyngeal- Nectar Cup -- Pharyngeal -- Pharyngeal- Nectar Straw -- Pharyngeal -- Pharyngeal- Thin Teaspoon -- Pharyngeal -- Pharyngeal- Thin Cup Pharyngeal residue - valleculae;Pharyngeal residue - cp segment Pharyngeal -- Pharyngeal- Thin Straw Pharyngeal residue - valleculae Pharyngeal -- Pharyngeal- Puree Delayed swallow initiation-vallecula;Pharyngeal residue - valleculae Pharyngeal -- Pharyngeal- Mechanical Soft -- Pharyngeal -- Pharyngeal- Regular Delayed swallow initiation-vallecula;Pharyngeal residue - valleculae Pharyngeal -- Pharyngeal- Multi-consistency -- Pharyngeal -- Pharyngeal- Pill WFL Pharyngeal -- Pharyngeal Comment --  CHL IP CERVICAL ESOPHAGEAL  PHASE 07/07/2016 Cervical Esophageal Phase Impaired Pudding Teaspoon -- Pudding Cup -- Honey Teaspoon -- Honey Cup -- Nectar Teaspoon -- Nectar Cup -- Nectar Straw -- Thin Teaspoon -- Thin Cup Reduced cricopharyngeal relaxation Thin Straw Reduced cricopharyngeal relaxation Puree Reduced cricopharyngeal relaxation Mechanical Soft -- Regular Reduced cricopharyngeal relaxation Multi-consistency -- Pill Reduced cricopharyngeal relaxation Cervical Esophageal Comment -- CHL IP GO 07/07/2016 Functional Assessment Tool Used skilled clinical judgment Functional Limitations Swallowing Swallow Current Status (Z6109) CI Swallow Goal Status (U0454) CI Swallow Discharge Status (U9811) CI Motor Speech Current Status (B1478) (None) Motor Speech Goal Status (G9562) (None) Motor Speech Goal Status (Z3086) (None) Spoken Language Comprehension Current Status (V7846) (None) Spoken Language Comprehension Goal Status (N6295) (None) Spoken Language Comprehension Discharge Status (M8413) (None) Spoken Language Expression Current Status  (K4401) (None) Spoken Language Expression Goal Status (U2725) (None) Spoken Language Expression Discharge Status [phone]) (None) Attention Current Status (I3474) (None) Attention Goal Status (Q5956) (None) Attention Discharge Status (L8756) (None) Memory Current Status (E3329) (None) Memory Goal Status (J1884) (None) Memory Discharge Status (Z6606) (None) Voice Current Status (T0160) (None) Voice Goal Status (F0932) (None) Voice Discharge Status (T5573) (None) Other Speech-Language Pathology Functional Limitation (U2025) (None) Other Speech-Language Pathology Functional Limitation Goal Status (K2706) (None) Other Speech-Language Pathology Functional Limitation Discharge Status [phone]) (None) Maxcine Ham 07/07/2016, 12:54 PM  Note populated for Jearl Klinefelter, student SLP Maxcine Ham, M.A. CCC-SLP [phone]              Assessment/Plan  Generalized weakness Will have patient work with PT/OT as tolerated to regain strength and restore function.  Fall precautions are in place.  TIA With left sided weakness, symptom has resolved. Monitor clinically. Continue bp meds. Continue aspirin 325 mg daily and atorvastatin 10 mg daily  Acute right leg DVT Off xarelto now and is on warfarin with goal inr 2-3  Dysphagia SLP to evaluate, aspiration precautions  Aphasia Post CVA, to work with SLP team. Therapy to evaluate for communication board  Right spastic hemiplegia Continue baclofen 5 mg tid for his spasticity and oxyIR 10 mg q6h prn pain and tylenol 650 mg q4h prn pain  Protein calorie malnutrition RD to evaluate, weekly weight check. Continue feeding supplement  ckd stage 3  Monitor bmp  Post traumatic seizure Remains seizure free. Continue keppra 250 mg bid  HLD Monitor lipid panel. Continue statin  gerd Continue oemprazole and carafate  Hypertensive heart disease Without heart failure. Continue losartan 25 mg daily with metoprolol tartrate 12.5 mg bid and monitor BP and check  bmp  Dm type 2 with ckd  Lab Results  Component Value Date   HGBA1C 5.4 07/01/2016    Monitor cbg. Continue metformin 1000 mg bid  Chronic depression Continue sertraline 150 mg daily and lorazepam 0.25 mg qhs. Psych services to evaluate  Constipation continue senna 2 tab bid and miralax 17 g daily  BPH continue proscar 5 mg daily with tamsulosin 0.4 mg daily  OAB Continue vesicare 5 mg daily   Goals of care: long term care   Labs/tests ordered: cbc, cmp   Family/ staff Communication: reviewed care plan with patient and nursing supervisor    Oneal Grout, MD Internal Medicine [facility] [address] [place], [place] [address] Cell Phone (Monday-Friday 8 am - 5 pm): [phone] On Call: [phone] and follow prompts after 5 pm and on weekends Office Phone: [phone] Office Fax: [phone]

## 2016-07-12 NOTE — Telephone Encounter (Signed)
Prescription request was received from:  [facility] [address] [place] [address]  Phone: [phone]  Fax: [phone]  

## 2016-07-19 LAB — CBC AND DIFFERENTIAL
HCT: 36 % — AB (ref 41–53)
Hemoglobin: 11.9 g/dL — AB (ref 13.5–17.5)
Platelets: 232 10*3/uL (ref 150–399)
WBC: 7.6 10*3/mL

## 2016-07-19 LAB — BASIC METABOLIC PANEL
BUN: 31 mg/dL — AB (ref 4–21)
CREATININE: 0.9 mg/dL (ref 0.6–1.3)
GLUCOSE: 131 mg/dL
POTASSIUM: 3.8 mmol/L (ref 3.4–5.3)
Sodium: 138 mmol/L (ref 137–147)

## 2016-08-08 ENCOUNTER — Other Ambulatory Visit: Payer: Self-pay | Admitting: *Deleted

## 2016-08-08 MED ORDER — LORAZEPAM 0.5 MG PO TABS: 0.5000 mg | ORAL_TABLET | Freq: Every day | ORAL | 0 refills | Status: DC

## 2016-08-08 NOTE — Telephone Encounter (Signed)
[facility] #[phone] Fax: [phone] 

## 2016-08-12 ENCOUNTER — Encounter: Payer: Self-pay | Admitting: Internal Medicine

## 2016-08-12 ENCOUNTER — Non-Acute Institutional Stay (SKILLED_NURSING_FACILITY): Payer: Medicare Other | Admitting: Internal Medicine

## 2016-08-12 DIAGNOSIS — E1122 Type 2 diabetes mellitus with diabetic chronic kidney disease: Secondary | ICD-10-CM | POA: Diagnosis not present

## 2016-08-12 DIAGNOSIS — IMO0001 Reserved for inherently not codable concepts without codable children: Secondary | ICD-10-CM

## 2016-08-12 DIAGNOSIS — N183 Chronic kidney disease, stage 3 (moderate): Secondary | ICD-10-CM | POA: Diagnosis not present

## 2016-08-12 DIAGNOSIS — R791 Abnormal coagulation profile: Secondary | ICD-10-CM | POA: Diagnosis not present

## 2016-08-12 DIAGNOSIS — G8111 Spastic hemiplegia affecting right dominant side: Secondary | ICD-10-CM | POA: Diagnosis not present

## 2016-08-12 DIAGNOSIS — I82501 Chronic embolism and thrombosis of unspecified deep veins of right lower extremity: Secondary | ICD-10-CM

## 2016-08-12 DIAGNOSIS — N3281 Overactive bladder: Secondary | ICD-10-CM | POA: Diagnosis not present

## 2016-08-12 DIAGNOSIS — I119 Hypertensive heart disease without heart failure: Secondary | ICD-10-CM | POA: Diagnosis not present

## 2016-08-12 DIAGNOSIS — Z8673 Personal history of transient ischemic attack (TIA), and cerebral infarction without residual deficits: Secondary | ICD-10-CM | POA: Diagnosis not present

## 2016-08-12 NOTE — Progress Notes (Signed)
LOCATION: [facility]  PCP: Oneal GroutPANDEY, Bobak Oguinn, MD   Code Status: Full Code  Goals of care: Advanced Directive information Advanced Directives 07/07/2016  Does patient have an advance directive? No  Would patient like information on creating an advanced directive? -       Extended Emergency Contact Information Primary Emergency Contact: Wrinkle,Shirley Address: [address]          [place], [place] [address] [place] Home Phone: [phone] Work Phone: [phone] Mobile Phone: [phone] Relation: Other Secondary Emergency Contact: Everitt AmberBennett,Athena Address: [address]          [place], [place] [address] [place] Home Phone: [phone] Mobile Phone: [phone] Relation: Daughter   Allergies  Allergen Reactions  . Latex Other (See Comments)    Per MAR  . Macrobid [employer][Nitrofurantoin Monohyd Macro] Other (See Comments)    Per MAR  . Morphine And Related Other (See Comments)    [facility]    Chief Complaint  Patient presents with  . Medical Management of Chronic Issues    Routine Visit     HPI:  Patient is a 70 y.o. male seen today for routine visit. He provides one word answer to questions. He feeds himself after setting up his tray. He requires hoyer lift for transfer. No fall reported. He is out of bed daily and pushes himself around on wheelchair. He is compliant with his medications  Review of Systems:  Constitutional: Negative for fever HENT: Negative for headache Eyes: Negative for double vision  Respiratory: Negative for cough and dyspnea.  Cardiovascular: Negative for chest pain, palpitations Gastrointestinal: Negative for heartburn, nausea, vomiting, abdominal pain.  Genitourinary: Negative for dysuria. Musculoskeletal: Negative for fall in the facility. positive for back pain. Followed by PMR.  Skin: Negative for rash.  Neurological: Negative for dizziness.    Past Medical History:  Diagnosis Date    . Cataract   . CKD (chronic kidney disease)   . Depression   . Diabetes mellitus without complication (HCC)   . Diabetic retinopathy (HCC)   . Hypertension   . Stroke [facility](HCC)     Medications:   Medication List       Accurate as of 08/12/16 12:03 PM. Always use your most recent med list.          acetaminophen 325 MG tablet Commonly known as:  TYLENOL Take 650 mg by mouth every 4 (four) hours as needed for mild pain.   aspirin 325 MG tablet Take 1 tablet (325 mg total) by mouth daily.   atorvastatin 10 MG tablet Commonly known as:  LIPITOR Take 1 tablet (10 mg total) by mouth daily at 6 PM.   baclofen 10 MG tablet Commonly known as:  LIORESAL Take 15 mg by mouth 3 (three) times daily.   DECUBI-VITE Caps Take 1 capsule by mouth daily.   finasteride 5 MG tablet Commonly known as:  PROSCAR Take 5 mg by mouth every evening.   levETIRAcetam 250 MG tablet Commonly known as:  KEPPRA Take 250 mg by mouth 2 (two) times daily.   LORazepam 0.5 MG tablet Commonly known as:  ATIVAN Take 1 tablet (0.5 mg total) by mouth at bedtime.   losartan 50 MG tablet Commonly known as:  COZAAR Take 0.5 tablets (25 mg total) by mouth daily.   Melatonin 10 MG Tabs Take 10 mg by mouth at bedtime.   metFORMIN 1000 MG tablet Commonly known as:  GLUCOPHAGE Take 1,000  mg by mouth 2 (two) times daily with a meal.   metoprolol tartrate 25 MG tablet Commonly known as:  LOPRESSOR Take 0.5 tablets (12.5 mg total) by mouth 2 (two) times daily.   omeprazole 20 MG capsule Commonly known as:  PRILOSEC Take 20 mg by mouth daily.   ondansetron 8 MG tablet Commonly known as:  ZOFRAN Take 8 mg by mouth every 8 (eight) hours as needed for nausea or vomiting.   Oxycodone HCl 10 MG Tabs Take one tablet by mouth every 6 hours as needed for severe pain   polyethylene glycol packet Commonly known as:  MIRALAX / GLYCOLAX Take 17 g by mouth daily.   PROCEL Powd Take 1 scoop by mouth 2 (two)  times daily.   senna 8.6 MG tablet Commonly known as:  SENOKOT Take 2 tablets by mouth 2 (two) times daily.   sertraline 100 MG tablet Commonly known as:  ZOLOFT Take 150 mg by mouth daily.   solifenacin 5 MG tablet Commonly known as:  VESICARE Take 5 mg by mouth daily.   sucralfate 1 g tablet Commonly known as:  CARAFATE Take 1 g by mouth 4 (four) times daily -  with meals and at bedtime.   tamsulosin 0.4 MG Caps capsule Commonly known as:  FLOMAX Take 0.4 mg by mouth daily after supper.   ursodiol 500 MG tablet Commonly known as:  ACTIGALL Take 500 mg by mouth 2 (two) times daily.   vitamin B-12 1000 MCG tablet Commonly known as:  CYANOCOBALAMIN Take 1,000 mcg by mouth daily.   warfarin 4 MG tablet Commonly known as:  COUMADIN Take 4.5 mg by mouth daily.   Zinc Oxide 12.8 % ointment Commonly known as:  TRIPLE PASTE Apply 1 application topically 3 (three) times daily.       Immunizations: Immunization History  Administered Date(s) Administered  . Influenza-Unspecified 07/15/2015  . Pneumococcal-Unspecified 09/02/2013     Physical Exam: Vitals:   08/12/16 1149  BP: 134/69  Pulse: 70  Resp: 20  Temp: (!) 96.2 F (35.7 C)  TempSrc: Oral  SpO2: 98%  Weight: 182 lb (82.6 kg)  Height: 6\' 4"  (1.93 m)   Body mass index is 22.15 kg/m.   General- elderly male, frail, in no acute distress Head- normocephalic, atraumatic Nose- no nasal discharge Throat- moist mucus membrane Eyes- no pallor, no icterus, no discharge, normal conjunctiva, normal sclera Neck- no cervical lymphadenopathy Cardiovascular- normal s1,s2, + systolic murmur Respiratory- bilateral clear to auscultation Abdomen- bowel sounds present, soft, non tender Musculoskeletal- right sided hemiplegia and spasticity present, on wheelchair Neurological- alert and oriented to person and place Skin- warm and dry Psychiatry- normal mood and affect    Labs reviewed: Basic Metabolic  Panel:  Recent Labs  07/01/16 0338 07/07/16 0156 07/07/16 0157 07/08/16 0618 07/19/16  NA 134* 137 134* 136 138  K 3.6 4.2 4.2 4.0 3.8  CL 99* 101 100* 103  --   CO2 27  --  25 27  --   GLUCOSE 116* 86 92 99  --   BUN 16 26* 23* 20 31*  CREATININE 0.86 0.90 0.97 0.76 0.9  CALCIUM 8.9  --  9.4 9.0  --    Liver Function Tests:  Recent Labs  06/30/16 1616 07/01/16 0338 07/07/16 0157  AST 18 16 18   ALT 13* 12* 17  ALKPHOS 142* 134* 140*  BILITOT 0.6 0.7 0.6  PROT 6.6 6.2* 6.8  ALBUMIN 3.5 3.3* 3.8   No results for  input(s): LIPASE, AMYLASE in the last 8760 hours. No results for input(s): AMMONIA in the last 8760 hours. CBC:  Recent Labs  06/30/16 1616  07/07/16 0157 07/08/16 0618 07/09/16 0337 07/10/16 0347 07/19/16  WBC 7.6  < > 9.5 5.7 6.4 6.5 7.6  NEUTROABS 4.9  --  5.1  --   --   --   --   HGB 12.0*  < > 12.4* 11.2* 10.8* 12.0* 11.9*  HCT 37.1*  < > 38.0* 34.8* 34.3* 37.4* 36*  MCV 87.7  < > 89.0 88.3 88.2 87.6  --   PLT 253  < > 258 209 212 224 232  < > = values in this interval not displayed. Cardiac Enzymes:  Recent Labs  06/30/16 1616  TROPONINI <0.03   BNP: Invalid input(s): POCBNP CBG:  Recent Labs  07/09/16 2056 07/10/16 0623 07/10/16 1122  GLUCAP 149* 104* 140*    Radiological Exams: No results found.   Assessment/Plan  Dm type 2 with renal impairment Lab Results  Component Value Date   HGBA1C 5.4 07/01/2016   a1c suggestive of controlled diabetes. cbg on review 115-170. Decrease his metformin to 500 mg bid from 1000 mg bid. Monitor cbg. Monitor for hypoglycemia. Has ckd stage 3. Continue lisinopril for renal protection. Continue aspirin and statin  supratherapeutic inr inr today 3.7. Hold coumadin for tonight. Start coumadin 4 mg from 08/13/16 and check inr 08/15/16  Right spastic hemiplegia Post cva. Reviewed plan of care with PMR. Patient sensitive to baclofen. to consider neurology referral for botox injection  evaluation. Continue baclofen 15 mg tid for spasm.   History of CVA Continue aspirin 325 mg daily and atorvastatin 10 mg daily  Hypertensive heart disease Without heart failure. Continue losartan 25 mg daily and metoprolol tartrate 12.5 mg bid and monitor BP and check bmp  OAB Continue his vesicare current regimen  chronic DVT Stable. Continue metoprolol and coumadin    Labs/tests ordered: a1c in December, inr   Family/ staff Communication: reviewed care plan with patient and nursing supervisor    Oneal GroutMAHIMA Hawley Pavia, MD Internal Medicine [facility] [address] [place], [place] [address] Cell Phone (Monday-Friday 8 am - 5 pm): [phone] On Call: [phone] and follow prompts after 5 pm and on weekends Office Phone: [phone] Office Fax: [phone]

## 2016-09-12 ENCOUNTER — Other Ambulatory Visit: Payer: Self-pay | Admitting: *Deleted

## 2016-09-12 MED ORDER — LORAZEPAM 0.5 MG PO TABS: 0.5000 mg | ORAL_TABLET | Freq: Every day | ORAL | 0 refills | Status: DC

## 2016-09-12 NOTE — Telephone Encounter (Signed)
[facility] #[phone] Fax: [phone] 

## 2016-09-16 ENCOUNTER — Non-Acute Institutional Stay (SKILLED_NURSING_FACILITY): Payer: Medicare Other | Admitting: Internal Medicine

## 2016-09-16 ENCOUNTER — Encounter: Payer: Self-pay | Admitting: Internal Medicine

## 2016-09-16 ENCOUNTER — Other Ambulatory Visit: Payer: Self-pay

## 2016-09-16 DIAGNOSIS — M19011 Primary osteoarthritis, right shoulder: Secondary | ICD-10-CM | POA: Diagnosis not present

## 2016-09-16 DIAGNOSIS — N401 Enlarged prostate with lower urinary tract symptoms: Secondary | ICD-10-CM | POA: Diagnosis not present

## 2016-09-16 DIAGNOSIS — K257 Chronic gastric ulcer without hemorrhage or perforation: Secondary | ICD-10-CM | POA: Diagnosis not present

## 2016-09-16 DIAGNOSIS — G8101 Flaccid hemiplegia affecting right dominant side: Secondary | ICD-10-CM | POA: Diagnosis not present

## 2016-09-16 DIAGNOSIS — N138 Other obstructive and reflux uropathy: Secondary | ICD-10-CM

## 2016-09-16 DIAGNOSIS — I119 Hypertensive heart disease without heart failure: Secondary | ICD-10-CM

## 2016-09-16 DIAGNOSIS — I639 Cerebral infarction, unspecified: Secondary | ICD-10-CM | POA: Diagnosis not present

## 2016-09-16 MED ORDER — OXYCODONE HCL 10 MG PO TABS: ORAL_TABLET | ORAL | 0 refills | Status: DC

## 2016-09-16 NOTE — Telephone Encounter (Signed)
Prescription request was received from:  [facility] [address] [place] [address]  Phone: [phone]  Fax: [phone]  

## 2016-09-16 NOTE — Progress Notes (Signed)
LOCATION: [facility]  PCP: Oneal GroutPANDEY, Mayline Dragon, MD   Code Status: Full Code  Goals of care: Advanced Directive information Advanced Directives 07/07/2016  Does Patient Have a Medical Advance Directive? No  Would patient like information on creating a medical advance directive? -       Extended Emergency Contact Information Primary Emergency Contact: Lewis,Gilbert Address: [address]          [place], [place] [address] [place] Home Phone: [phone] Work Phone: [phone] Mobile Phone: [phone] Relation: Other Secondary Emergency Contact: Everitt AmberBennett,Gilbert Address: [address]          [place], [place] [address] [place] Home Phone: [phone] Mobile Phone: [phone] Relation: Daughter   Allergies  Allergen Reactions  . Latex Other (See Comments)    Per MAR  . Macrobid [employer][Nitrofurantoin Monohyd Macro] Other (See Comments)    Per MAR  . Morphine And Related Other (See Comments)    [facility]    Chief Complaint  Patient presents with  . Medical Management of Chronic Issues     HPI:  Patient is a 70 y.o. male seen today for routine visit. He has been at his baseline per nursing. He feeds himself with some assistance on setting up the tree. He takes his medications. He is out of bed on a daily basis. He continues to be on Coumadin. No fall reported. No acute behavioral issues reported. He had stage II pressure ulcer to the left gluteal crease that has now resolved.  Review of Systems:  Constitutional: Negative for fever HENT: Negative for headache Respiratory: Negative for cough and dyspnea.  Cardiovascular: Negative for chest pain, palpitations Gastrointestinal: Negative for heartburn, nausea, vomiting, abdominal pain.  Genitourinary: Negative for dysuria. Musculoskeletal: Negative for fall in the facility. Skin: Negative for rash.  Neurological: Negative for dizziness.    Past Medical History:    Diagnosis Date  . Cataract   . CKD (chronic kidney disease)   . Depression   . Diabetes mellitus without complication (HCC)   . Diabetic retinopathy (HCC)   . Hypertension   . Stroke [facility](HCC)     Medications: Allergies as of 09/16/2016      Reactions   Latex Other (See Comments)   Per MAR   Macrobid [nitrofurantoin Monohyd Macro] Other (See Comments)   Per MAR   Morphine And Related Other (See Comments)   [facility]      Medication List       Accurate as of 09/16/16  2:59 PM. Always use your most recent med list.          acetaminophen 325 MG tablet Commonly known as:  TYLENOL Take 650 mg by mouth every 4 (four) hours as needed for mild pain.   aspirin 325 MG tablet Take 1 tablet (325 mg total) by mouth daily.   atorvastatin 10 MG tablet Commonly known as:  LIPITOR Take 1 tablet (10 mg total) by mouth daily at 6 PM.   baclofen 10 MG tablet Commonly known as:  LIORESAL Take 15 mg by mouth 3 (three) times daily.   BIOFREEZE 4 % Gel Generic drug:  Menthol (Topical Analgesic) Apply 1 application topically 2 (two) times daily as needed. Apply to right shoulder   DECUBI-VITE Caps Take 1 capsule by mouth daily.   feeding supplement (PRO-STAT SUGAR FREE 64) Liqd Take 30 mLs by mouth 2 (two) times daily.   finasteride 5 MG tablet Commonly known as:  PROSCAR Take 5 mg by mouth every evening.   levETIRAcetam 250 MG tablet Commonly known as:  KEPPRA Take 250 mg by mouth 2 (two) times daily.   LORazepam 0.5 MG tablet Commonly known as:  ATIVAN Take 1 tablet (0.5 mg total) by mouth at bedtime.   losartan 50 MG tablet Commonly known as:  COZAAR Take 0.5 tablets (25 mg total) by mouth daily.   Melatonin 10 MG Tabs Take 10 mg by mouth at bedtime.   metFORMIN 1000 MG tablet Commonly known as:  GLUCOPHAGE Take 1,000 mg by mouth 2 (two) times daily with a meal.   metoprolol tartrate 25 MG tablet Commonly known as:  LOPRESSOR Take 0.5 tablets (12.5 mg total) by  mouth 2 (two) times daily.   omeprazole 20 MG capsule Commonly known as:  PRILOSEC Take 20 mg by mouth daily.   ondansetron 8 MG tablet Commonly known as:  ZOFRAN Take 8 mg by mouth every 8 (eight) hours as needed for nausea or vomiting.   Oxycodone HCl 10 MG Tabs Take one tablet by mouth every 6 hours as needed for severe pain   polyethylene glycol packet Commonly known as:  MIRALAX / GLYCOLAX Take 17 g by mouth daily.   PROCEL Powd Take 1 scoop by mouth 2 (two) times daily.   senna 8.6 MG tablet Commonly known as:  SENOKOT Take 2 tablets by mouth 2 (two) times daily.   sertraline 100 MG tablet Commonly known as:  ZOLOFT Take 150 mg by mouth daily.   solifenacin 5 MG tablet Commonly known as:  VESICARE Take 5 mg by mouth daily.   sucralfate 1 g tablet Commonly known as:  CARAFATE Take 1 g by mouth 4 (four) times daily -  with meals and at bedtime.   tamsulosin 0.4 MG Caps capsule Commonly known as:  FLOMAX Take 0.4 mg by mouth daily after supper.   ursodiol 500 MG tablet Commonly known as:  ACTIGALL Take 500 mg by mouth 2 (two) times daily.   vitamin B-12 1000 MCG tablet Commonly known as:  CYANOCOBALAMIN Take 1,000 mcg by mouth daily.   warfarin 3 MG tablet Commonly known as:  COUMADIN Take 3.5 mg by mouth daily. Monday, Wednesday, Friday, Saturday and Sunday   warfarin 4 MG tablet Commonly known as:  COUMADIN Take 4 mg by mouth daily. Tuesday and Thursday   Zinc Oxide 12.8 % ointment Commonly known as:  TRIPLE PASTE Apply 1 application topically 3 (three) times daily.       Immunizations: Immunization History  Administered Date(s) Administered  . Influenza-Unspecified 07/15/2015  . Pneumococcal-Unspecified 09/02/2013     Physical Exam: Vitals:   09/16/16 1441  BP: 110/64  Pulse: 60  Resp: 18  Temp: 97.2 F (36.2 C)  TempSrc: Oral  SpO2: 96%  Weight: 174 lb 9.6 oz (79.2 kg)  Height: 6\' 4"  (1.93 m)   Body mass index is 21.25  kg/m.   General- elderly male, frail, in no acute distress Head- normocephalic, atraumatic Nose- no nasal discharge Throat- moist mucus membrane Eyes- no pallor, no icterus, no discharge, normal conjunctiva, normal sclera Neck- no cervical lymphadenopathy Cardiovascular- normal s1,s2, + systolic murmur Respiratory- bilateral clear to auscultation Abdomen- bowel sounds present, soft, non tender Musculoskeletal- right sided hemiplegia and spasticity present, on wheelchair Neurological- alert and oriented to person and place Skin- warm and dry, preventative dressing to his left gluteal crease Psychiatry- normal mood and affect    Labs reviewed: Basic Metabolic Panel:  Recent  Labs  07/01/16 0338 07/07/16 0156 07/07/16 0157 07/08/16 0618 07/19/16  NA 134* 137 134* 136 138  K 3.6 4.2 4.2 4.0 3.8  CL 99* 101 100* 103  --   CO2 27  --  25 27  --   GLUCOSE 116* 86 92 99  --   BUN 16 26* 23* 20 31*  CREATININE 0.86 0.90 0.97 0.76 0.9  CALCIUM 8.9  --  9.4 9.0  --    Liver Function Tests:  Recent Labs  06/30/16 1616 07/01/16 0338 07/07/16 0157  AST 18 16 18   ALT 13* 12* 17  ALKPHOS 142* 134* 140*  BILITOT 0.6 0.7 0.6  PROT 6.6 6.2* 6.8  ALBUMIN 3.5 3.3* 3.8   No results for input(s): LIPASE, AMYLASE in the last 8760 hours. No results for input(s): AMMONIA in the last 8760 hours. CBC:  Recent Labs  06/30/16 1616  07/07/16 0157 07/08/16 0618 07/09/16 0337 07/10/16 0347 07/19/16  WBC 7.6  < > 9.5 5.7 6.4 6.5 7.6  NEUTROABS 4.9  --  5.1  --   --   --   --   HGB 12.0*  < > 12.4* 11.2* 10.8* 12.0* 11.9*  HCT 37.1*  < > 38.0* 34.8* 34.3* 37.4* 36*  MCV 87.7  < > 89.0 88.3 88.2 87.6  --   PLT 253  < > 258 209 212 224 232  < > = values in this interval not displayed. Cardiac Enzymes:  Recent Labs  06/30/16 1616  TROPONINI <0.03   BNP: Invalid input(s): POCBNP CBG:  Recent Labs  07/09/16 2056 07/10/16 0623 07/10/16 1122  GLUCAP 149* 104* 140*     Radiological Exams: No results found.   Assessment/Plan  Chronic gastric ulcer Stable. No bleed reported. No nausea or vomiting. Continue omeprazole 20 mg daily.  Hypertension  stable blood pressure reading overall on review. Continue metoprolol 12.5 mg twice a day and losartan 25 mg daily.  Right hemiplegia Post stroke. Wheelchair bound. Continue baclofen 15 mg 3 times a day scheduled. Continue oxycodone 10 mg every 6 hours as needed for pain and Tylenol 650 mg every 4 hours as needed for pain. Continue aspirin and statin.  BPH with urinary symptoms Continue tamsulosin and Proscar current regimen. No changes made.   Right shoulder osteoarthritis Continue prior freeze gel twice a day as needed for pain.    Oneal Grout, MD Internal Medicine [facility] [address] [place], [place] [address] Cell Phone (Monday-Friday 8 am - 5 pm): [phone] On Call: [phone] and follow prompts after 5 pm and on weekends Office Phone: [phone] Office Fax: [phone]

## 2016-09-16 NOTE — Telephone Encounter (Signed)
Rx faxed to [facility] @ [phone], phone number [phone]  

## 2016-11-02 LAB — CBC AND DIFFERENTIAL
HCT: 32 % — AB (ref 41–53)
Hemoglobin: 9.9 g/dL — AB (ref 13.5–17.5)
Platelets: 272 10*3/uL (ref 150–399)
WBC: 9.5 10^3/mL

## 2016-11-02 LAB — HEMOGLOBIN A1C: Hemoglobin A1C: 6

## 2016-11-10 ENCOUNTER — Other Ambulatory Visit: Payer: Self-pay

## 2016-11-10 MED ORDER — LORAZEPAM 0.5 MG PO TABS: 0.5000 mg | ORAL_TABLET | Freq: Every day | ORAL | 5 refills | Status: DC

## 2016-11-10 NOTE — Telephone Encounter (Signed)
Rx faxed to [facility] @ [phone], phone number [phone]  

## 2016-11-14 LAB — BASIC METABOLIC PANEL
BUN: 52 mg/dL — AB (ref 4–21)
Creatinine: 1.1 mg/dL (ref 0.6–1.3)
Glucose: 138 mg/dL
POTASSIUM: 3 mmol/L — AB (ref 3.4–5.3)
SODIUM: 146 mmol/L (ref 137–147)

## 2016-11-14 LAB — CBC AND DIFFERENTIAL
HEMATOCRIT: 28 % — AB (ref 41–53)
HEMOGLOBIN: 8.4 g/dL — AB (ref 13.5–17.5)
Platelets: 449 10*3/uL — AB (ref 150–399)
WBC: 14.6 10*3/mL

## 2016-11-22 ENCOUNTER — Ambulatory Visit (HOSPITAL_BASED_OUTPATIENT_CLINIC_OR_DEPARTMENT_OTHER): Payer: Medicare Other | Admitting: Physical Medicine & Rehabilitation

## 2016-11-22 ENCOUNTER — Encounter: Payer: Self-pay | Admitting: Physical Medicine & Rehabilitation

## 2016-11-22 ENCOUNTER — Encounter: Payer: Medicare Other | Attending: Physical Medicine & Rehabilitation

## 2016-11-22 VITALS — BP 120/74 | HR 79 | Resp 14

## 2016-11-22 DIAGNOSIS — E11319 Type 2 diabetes mellitus with unspecified diabetic retinopathy without macular edema: Secondary | ICD-10-CM | POA: Insufficient documentation

## 2016-11-22 DIAGNOSIS — I129 Hypertensive chronic kidney disease with stage 1 through stage 4 chronic kidney disease, or unspecified chronic kidney disease: Secondary | ICD-10-CM | POA: Insufficient documentation

## 2016-11-22 DIAGNOSIS — G8110 Spastic hemiplegia affecting unspecified side: Secondary | ICD-10-CM | POA: Insufficient documentation

## 2016-11-22 DIAGNOSIS — N189 Chronic kidney disease, unspecified: Secondary | ICD-10-CM | POA: Insufficient documentation

## 2016-11-22 DIAGNOSIS — E1122 Type 2 diabetes mellitus with diabetic chronic kidney disease: Secondary | ICD-10-CM | POA: Insufficient documentation

## 2016-11-22 DIAGNOSIS — F329 Major depressive disorder, single episode, unspecified: Secondary | ICD-10-CM | POA: Insufficient documentation

## 2016-11-22 DIAGNOSIS — I69251 Hemiplegia and hemiparesis following other nontraumatic intracranial hemorrhage affecting right dominant side: Secondary | ICD-10-CM | POA: Insufficient documentation

## 2016-11-22 NOTE — Progress Notes (Signed)
Subjective:    Patient ID: Gilbert Lewis, male    DOB: November 26, 1945, 71 y.o.   MRN: [id number] Reason for referral, right spastic hemiplegia. Severe aphasia, information obtained from review of the HR HPI 71 year old male with history of diabetes and hypertension who suffered a left basal ganglia hemorrhagic stroke in 2009. He hasn't severe swallowing issues and underwent PEG Placement in October 2009. He was hospitalized at [facility] in [place]. Further records are not available from 2009 through 05/03/2011. Office visit with primary care in [facility], noted history of CVA as well as a aphasia, reported patient able to walk with a 4 point cane after discharge from a rehabilitation facility. There was no upper extremity recovery noted, however  Past surgical history remarkable for right hip Also seen at [facility] orthopedic clinic. Length 15 2013 for subacromial bursitis on the left shoulder No other records until 06/13/2016. Look like a transfer from skilled nursing facility [facility] in [place] to [facility] in [place] On long-term rivaroxaban, developed posterior tibial vein DVT hospitalized at [facility] 06/30/2016. Switched to Lovenox and then Coumadin Another hospitalization 07/07/2016 for TIA. CT head showed left MCA distribution encephalomalacia attributed to prior stroke. No new infarcts. Past history significant for biliary cirrhosis  Currently on baclofen 15 mg 3 times a day Pain Inventory Average Pain 0 Pain Right Now 0 My pain is no pain  In the last 24 hours, has pain interfered with the following? General activity 0 Relation with others 0 Enjoyment of life 0 What TIME of day is your pain at its worst? no pain Sleep (in general) NA  Pain is worse with: no pain Pain improves with: no pain Relief from Meds: no pain  Mobility use a wheelchair needs help with transfers  Function retired  Neuro/Psych confusion  Prior  Studies Any changes since last visit?  no  Physicians involved in your care Any changes since last visit?  no   Family History  Problem Relation Age of Onset  . Family history unknown: Yes   Social History   Social History  . Marital status: Single    Spouse name: N/A  . Number of children: N/A  . Years of education: N/A   Social History Main Topics  . Smoking status: Never Smoker  . Smokeless tobacco: Never Used  . Alcohol use No  . Drug use: No  . Sexual activity: No   Other Topics Concern  . None   Social History Narrative  . None   History reviewed. No pertinent surgical history. Past Medical History:  Diagnosis Date  . Cataract   . CKD (chronic kidney disease)   . Depression   . Diabetes mellitus without complication (HCC)   . Diabetic retinopathy (HCC)   . Hypertension   . Stroke (HCC)    BP 120/74   Pulse 79   Resp 14   SpO2 95%   Opioid Risk Score:   Fall Risk Score:  `1  Depression screen PHQ 2/9  No flowsheet data found.  Review of Systems  Constitutional: Negative.   HENT: Negative.   Eyes: Negative.   Respiratory: Negative.   Cardiovascular: Negative.   Gastrointestinal: Negative.   Endocrine: Negative.   Genitourinary: Negative.   Musculoskeletal:       Spasm  Skin: Negative.   Allergic/Immunologic: Negative.   Hematological: Negative.   Psychiatric/Behavioral: Positive for confusion and decreased concentration.  All other systems reviewed and are negative.  Objective:   Physical Exam  Constitutional: He appears well-developed and well-nourished.  Wheelchair bound  HENT:  Head: Normocephalic and atraumatic.  Eyes: Conjunctivae and EOM are normal. Pupils are equal, round, and reactive to light.  Neck: No JVD present.  Cardiovascular: Normal rate, regular rhythm and normal heart sounds.   No murmur heard. Pulmonary/Chest: Effort normal and breath sounds normal. No respiratory distress. He has no wheezes.  Abdominal:  Soft. Bowel sounds are normal. He exhibits no distension. There is no tenderness.  Healed scar from gastrostomy tube  Musculoskeletal:  Right shoulder has limited range of motion approximately 45 of abduction, 45 of forward flexion, essentially 0 external rotation. Pain at end range in all direction. He has no evidence of joint swelling in the elbow, wrist or fingers. He does have pain with passive finger range of motion. Prominence of the FCR and palmaris longus longus tendons.   Neurological:  Modified Ashworth 3-4 right pectoralis Modified Ashworth 1 at biceps and triceps. Modified Ashworth 3 at the finger and wrist flexors on the right side. Upper extremity functional movement, absent Manual muscle testing, 0 in the right upper limb. Right lower limb has some hip and knee extensor synergy did not appreciate any isolated movements at the ankle.  Nursing note and vitals reviewed.  Expressive aphasia, unable to state yeses and nose. Unable to state his name. Is able to follow simple commands without difficulties       Assessment & Plan:  1. 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 Would recommend 200 units, right pectoralis 50 units, right FDS 50 units, right FDP 50 units, right FCR 25 units, right FPL 25 units, right FCU  Patient nods yes when asked whether he wants to proceed with this at next visit

## 2016-11-24 LAB — BASIC METABOLIC PANEL WITH GFR
BUN: 25 mg/dL — AB (ref 4–21)
Creatinine: 0.9 mg/dL (ref 0.6–1.3)
Glucose: 92 mg/dL
Potassium: 3.4 mmol/L (ref 3.4–5.3)
Sodium: 143 mmol/L (ref 137–147)

## 2016-11-24 LAB — CBC AND DIFFERENTIAL
HCT: 31 % — AB (ref 41–53)
Hemoglobin: 9.2 g/dL — AB (ref 13.5–17.5)
Platelets: 409 10*3/uL — AB (ref 150–399)
WBC: 8 10*3/mL

## 2016-12-02 LAB — CBC AND DIFFERENTIAL
HEMATOCRIT: 31 % — AB (ref 41–53)
Hemoglobin: 9.4 g/dL — AB (ref 13.5–17.5)
PLATELETS: 277 10*3/uL (ref 150–399)
WBC: 6.1 10*3/mL

## 2016-12-02 LAB — BASIC METABOLIC PANEL
BUN: 30 mg/dL — AB (ref 4–21)
Creatinine: 0.7 mg/dL (ref 0.6–1.3)
GLUCOSE: 87 mg/dL
Potassium: 3.6 mmol/L (ref 3.4–5.3)
Sodium: 143 mmol/L (ref 137–147)

## 2016-12-02 LAB — VITAMIN B12: VITAMIN B 12: 1124

## 2016-12-02 LAB — VITAMIN D 25 HYDROXY (VIT D DEFICIENCY, FRACTURES): Vit D, 25-Hydroxy: 18.26

## 2016-12-02 LAB — HEPATIC FUNCTION PANEL
ALK PHOS: 70 U/L (ref 25–125)
ALT: 8 U/L — AB (ref 10–40)
AST: 13 U/L — AB (ref 14–40)
Bilirubin, Total: 0.4 mg/dL

## 2016-12-02 LAB — LIPID PANEL
Cholesterol: 77 mg/dL (ref 0–200)
HDL: 34 mg/dL — AB (ref 35–70)
LDL CALC: 22 mg/dL
TRIGLYCERIDES: 106 mg/dL (ref 40–160)

## 2016-12-08 ENCOUNTER — Ambulatory Visit (HOSPITAL_BASED_OUTPATIENT_CLINIC_OR_DEPARTMENT_OTHER): Payer: Medicare Other | Admitting: Physical Medicine & Rehabilitation

## 2016-12-08 ENCOUNTER — Encounter: Payer: Medicare Other | Attending: Physical Medicine & Rehabilitation

## 2016-12-08 ENCOUNTER — Encounter: Payer: Self-pay | Admitting: Physical Medicine & Rehabilitation

## 2016-12-08 VITALS — BP 130/76 | HR 76 | Resp 14

## 2016-12-08 DIAGNOSIS — G8110 Spastic hemiplegia affecting unspecified side: Secondary | ICD-10-CM | POA: Diagnosis not present

## 2016-12-08 DIAGNOSIS — F329 Major depressive disorder, single episode, unspecified: Secondary | ICD-10-CM | POA: Insufficient documentation

## 2016-12-08 DIAGNOSIS — I129 Hypertensive chronic kidney disease with stage 1 through stage 4 chronic kidney disease, or unspecified chronic kidney disease: Secondary | ICD-10-CM | POA: Insufficient documentation

## 2016-12-08 DIAGNOSIS — E11319 Type 2 diabetes mellitus with unspecified diabetic retinopathy without macular edema: Secondary | ICD-10-CM | POA: Insufficient documentation

## 2016-12-08 DIAGNOSIS — E1122 Type 2 diabetes mellitus with diabetic chronic kidney disease: Secondary | ICD-10-CM | POA: Diagnosis not present

## 2016-12-08 DIAGNOSIS — N189 Chronic kidney disease, unspecified: Secondary | ICD-10-CM | POA: Diagnosis not present

## 2016-12-08 DIAGNOSIS — I69251 Hemiplegia and hemiparesis following other nontraumatic intracranial hemorrhage affecting right dominant side: Secondary | ICD-10-CM | POA: Diagnosis not present

## 2016-12-08 NOTE — Patient Instructions (Signed)

## 2016-12-08 NOTE — Progress Notes (Signed)
Botox Injection for spasticity using needle EMG guidance  Dilution: 50 Units/ml Indication: Severe spasticity which interferes with ADL,mobility and/or  hygiene and is unresponsive to medication management and other conservative care Informed consent was obtained after describing risks and benefits of the procedure with the patient. This includes bleeding, bruising, infection, excessive weakness, or medication side effects. A REMS form is on file and signed. Needle: 27g 1" for UE, 25 g 2" for LE needle electrode Number of units per muscle Would recommend 125 units, right pectoralis 50 units, right FDS 50 units, right FDP 50 units, right FCR 25 units, right FPL 25 units, right FCU  75U Right tib post  All injections were done after obtaining appropriate EMG activity and after negative drawback for blood. The patient tolerated the procedure well. Post procedure instructions were given. A followup appointment was made.

## 2016-12-09 ENCOUNTER — Encounter: Payer: Self-pay | Admitting: Internal Medicine

## 2016-12-09 ENCOUNTER — Non-Acute Institutional Stay (SKILLED_NURSING_FACILITY): Payer: Medicare Other | Admitting: Internal Medicine

## 2016-12-09 DIAGNOSIS — G8110 Spastic hemiplegia affecting unspecified side: Secondary | ICD-10-CM | POA: Diagnosis not present

## 2016-12-09 DIAGNOSIS — I6930 Unspecified sequelae of cerebral infarction: Secondary | ICD-10-CM

## 2016-12-09 DIAGNOSIS — Z872 Personal history of diseases of the skin and subcutaneous tissue: Secondary | ICD-10-CM | POA: Insufficient documentation

## 2016-12-09 DIAGNOSIS — K219 Gastro-esophageal reflux disease without esophagitis: Secondary | ICD-10-CM

## 2016-12-09 DIAGNOSIS — R569 Unspecified convulsions: Secondary | ICD-10-CM | POA: Diagnosis not present

## 2016-12-09 DIAGNOSIS — E440 Moderate protein-calorie malnutrition: Secondary | ICD-10-CM

## 2016-12-09 NOTE — Progress Notes (Signed)
LOCATION: [facility]  PCP: Oneal Grout, MD   Code Status: Full Code  Goals of care: Advanced Directive information Advanced Directives 12/08/2016  Does Patient Have a Medical Advance Directive? No;Yes  Type of Estate agent of [place];Living will  Would patient like information on creating a medical advance directive? -       Extended Emergency Contact Information Primary Emergency Contact: Caraher,Shirley Address: [address]          [place], [place] [address] Darden Amber of [place] Home Phone: [phone] Work Phone: [phone] Mobile Phone: [phone] Relation: Other Secondary Emergency Contact: Everitt Amber Address: [address]          [place], [place] [address] Darden Amber of [place] Home Phone: [phone] Mobile Phone: [phone] Relation: Daughter   Allergies  Allergen Reactions  . Latex Other (See Comments)    Per MAR  . Macrobid [employer] Macro] Other (See Comments)    Per MAR  . Morphine And Related Other (See Comments)    Per [facility]    Chief Complaint  Patient presents with  . Medical Management of Chronic Issues    Routine Visit      HPI:  Patient is a 71 y.o. male seen today for routine visit. He has been at his baseline per nursing. He feeds himself with some assistance and is out of bed on a daily basis. No acute behavioral issues reported. He continues to receive preventative dressing to help prevent pressure ulcers. He received Botox injection to his arm and his pain has been under control at present. He continues to wear the brace to his right arm up. He had a fall 2 weeks ago while trying to get out of the wheelchair unassisted. No apparent injury has been reported. He is compliant with his medications.   Review of Systems:  Constitutional: Negative for fever HENT: Negative for headache Respiratory: Negative for cough and dyspnea.  Cardiovascular: Negative for chest pain,  palpitations Gastrointestinal: Negative for heartburn, nausea, vomiting, abdominal pain.  Genitourinary: Negative for dysuria. Skin: Negative for rash.  Neurological: Negative for dizziness.    Past Medical History:  Diagnosis Date  . Cataract   . CKD (chronic kidney disease)   . Depression   . Diabetes mellitus without complication (HCC)   . Diabetic retinopathy (HCC)   . Hypertension   . Stroke [facility])     Medications: Allergies as of 12/09/2016      Reactions   Latex Other (See Comments)   Per MAR   Macrobid [nitrofurantoin Monohyd Macro] Other (See Comments)   Per MAR   Morphine And Related Other (See Comments)   Per [facility]      Medication List       Accurate as of 12/09/16  3:22 PM. Always use your most recent med list.          acetaminophen 325 MG tablet Commonly known as:  TYLENOL Take 650 mg by mouth every 4 (four) hours as needed for mild pain.   aspirin 325 MG tablet Take 1 tablet (325 mg total) by mouth daily.   atorvastatin 10 MG tablet Commonly known as:  LIPITOR Take 1 tablet (10 mg total) by mouth daily at 6 PM.   baclofen 10 MG tablet Commonly known as:  LIORESAL Take 15 mg by mouth 3 (three) times daily.   Cholecalciferol 50000 units capsule Take 50,000 Units by mouth every 30 (thirty) days.   DECUBI-VITE Caps Take 1 capsule  by mouth daily.   feeding supplement (PRO-STAT SUGAR FREE 64) Liqd Take 30 mLs by mouth 2 (two) times daily.   ferrous sulfate 325 (65 FE) MG tablet Take 325 mg by mouth daily with breakfast.   finasteride 5 MG tablet Commonly known as:  PROSCAR Take 5 mg by mouth every evening.   levETIRAcetam 250 MG tablet Commonly known as:  KEPPRA Take 250 mg by mouth 2 (two) times daily.   LORazepam 0.5 MG tablet Commonly known as:  ATIVAN Take 1 tablet (0.5 mg total) by mouth at bedtime.   losartan 50 MG tablet Commonly known as:  COZAAR Take 0.5 tablets (25 mg total) by mouth daily.   Melatonin 10 MG Tabs Take 10 mg  by mouth at bedtime.   metFORMIN 1000 MG tablet Commonly known as:  GLUCOPHAGE Take 500 mg by mouth 2 (two) times daily with a meal.   metoprolol tartrate 25 MG tablet Commonly known as:  LOPRESSOR Take 0.5 tablets (12.5 mg total) by mouth 2 (two) times daily.   omeprazole 20 MG capsule Commonly known as:  PRILOSEC Take 20 mg by mouth daily.   ondansetron 8 MG tablet Commonly known as:  ZOFRAN Take 8 mg by mouth every 8 (eight) hours as needed for nausea or vomiting.   Oxycodone HCl 10 MG Tabs Take one tablet by mouth every 6 hours as needed for severe pain   polyethylene glycol packet Commonly known as:  MIRALAX / GLYCOLAX Take 17 g by mouth daily.   PROCEL Powd Take 1 scoop by mouth 2 (two) times daily.   promethazine 25 MG suppository Commonly known as:  PHENERGAN Place 25 mg rectally every 12 (twelve) hours as needed for nausea or vomiting.   senna 8.6 MG tablet Commonly known as:  SENOKOT Take 2 tablets by mouth 2 (two) times daily.   sertraline 100 MG tablet Commonly known as:  ZOLOFT Take 150 mg by mouth daily.   solifenacin 5 MG tablet Commonly known as:  VESICARE Take 5 mg by mouth daily.   sucralfate 1 g tablet Commonly known as:  CARAFATE Take 1 g by mouth 4 (four) times daily -  with meals and at bedtime.   SYSTANE BALANCE 0.6 % Soln Generic drug:  Propylene Glycol Apply 1 drop to eye 2 (two) times daily.   tamsulosin 0.4 MG Caps capsule Commonly known as:  FLOMAX Take 0.4 mg by mouth daily after supper.   UNABLE TO FIND Med Name: Med pass 120 mL by mouth 2 times daily   ursodiol 500 MG tablet Commonly known as:  ACTIGALL Take 500 mg by mouth 2 (two) times daily.   vitamin B-12 1000 MCG tablet Commonly known as:  CYANOCOBALAMIN Take 1,000 mcg by mouth daily.   warfarin 3 MG tablet Commonly known as:  COUMADIN Take 3 mg by mouth daily. Monday-Friday   warfarin 4 MG tablet Commonly known as:  COUMADIN Take 4 mg by mouth daily.  Saturday and Sunday   Zinc Oxide 12.8 % ointment Commonly known as:  TRIPLE PASTE Apply 1 application topically 3 (three) times daily.       Immunizations: Immunization History  Administered Date(s) Administered  . Influenza-Unspecified 07/15/2015  . Pneumococcal-Unspecified 09/02/2013     Physical Exam: Vitals:   12/09/16 1458  BP: 139/75  Pulse: 68  Resp: 18  Temp: 97.8 F (36.6 C)  TempSrc: Oral  SpO2: 97%  Weight: 160 lb (72.6 kg)  Height: 6\' 4"  (1.93 m)  Body mass index is 19.48 kg/m.   General- elderly male, frail, Thin built, in no acute distress Head- normocephalic, atraumatic Nose- no nasal discharge Throat- moist mucus membrane Eyes- no pallor, no icterus, no discharge, normal conjunctiva, normal sclera Neck- no cervical lymphadenopathy Cardiovascular- normal s1,s2, + systolic murmur Respiratory- bilateral clear to auscultation Abdomen- bowel sounds present, soft, non tender Musculoskeletal- right sided hemiplegia and spasticity present Neurological- alert and oriented to self  Skin- warm and dry, preventative dressing in place Psychiatry- normal mood and affect    Labs reviewed: Basic Metabolic Panel:  Recent Labs  16/10/96 0338 07/07/16 0156 07/07/16 0157 07/08/16 0618  11/14/16 11/24/16 12/02/16  NA 134* 137 134* 136  < > 146 143 143  K 3.6 4.2 4.2 4.0  < > 3.0* 3.4 3.6  CL 99* 101 100* 103  --   --   --   --   CO2 27  --  25 27  --   --   --   --   GLUCOSE 116* 86 92 99  --   --   --   --   BUN 16 26* 23* 20  < > 52* 25* 30*  CREATININE 0.86 0.90 0.97 0.76  < > 1.1 0.9 0.7  CALCIUM 8.9  --  9.4 9.0  --   --   --   --   < > = values in this interval not displayed. Liver Function Tests:  Recent Labs  06/30/16 1616 07/01/16 0338 07/07/16 0157 12/02/16  AST 18 16 18  13*  ALT 13* 12* 17 8*  ALKPHOS 142* 134* 140* 70  BILITOT 0.6 0.7 0.6  --   PROT 6.6 6.2* 6.8  --   ALBUMIN 3.5 3.3* 3.8  --    No results for input(s): LIPASE,  AMYLASE in the last 8760 hours. No results for input(s): AMMONIA in the last 8760 hours. CBC:  Recent Labs  06/30/16 1616  07/07/16 0157 07/08/16 0618 07/09/16 0337 07/10/16 0347  11/14/16 11/24/16 12/02/16  WBC 7.6  < > 9.5 5.7 6.4 6.5  < > 14.6 8.0 6.1  NEUTROABS 4.9  --  5.1  --   --   --   --   --   --   --   HGB 12.0*  < > 12.4* 11.2* 10.8* 12.0*  < > 8.4* 9.2* 9.4*  HCT 37.1*  < > 38.0* 34.8* 34.3* 37.4*  < > 28* 31* 31*  MCV 87.7  < > 89.0 88.3 88.2 87.6  --   --   --   --   PLT 253  < > 258 209 212 224  < > 449* 409* 277  < > = values in this interval not displayed. Cardiac Enzymes:  Recent Labs  06/30/16 1616  TROPONINI <0.03   BNP: Invalid input(s): POCBNP CBG:  Recent Labs  07/09/16 2056 07/10/16 0623 07/10/16 1122  GLUCAP 149* 104* 140*    Radiological Exams: No results found.   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 Status post Botox injection to the right arm yesterday. Continue to wear a brace to his right arm. Provide supportive care and fall precautions.  Gastroesophageal reflux disease Continue omeprazole 20 mg daily and monitor. He has history of gastric ulcer. No bleed has been reported.  Seizure disorder Remains seizure-free. Continue Keppra ER  Protein calorie malnutrition Continue protein supplements, RD on board. Monitor weekly weights. Monitor his oral intake. 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 Continue Lipitor 10 mg daily, blood pressure medicines and Coumadin for stroke prophylaxis  History of pressure ulcer Continue to provide preventative dressing, has low air loss mattress to help prevent pressure ulcer, frequent repositioning required given his right spastic hemiplegia    Oneal Grout, MD Internal Medicine [facility] [address], [place] [address] Cell Phone (Monday-Friday 8  am - 5 pm): [phone] On Call: [phone] and follow prompts after 5 pm and on weekends Office Phone: [phone] Office Fax: [phone]

## 2016-12-23 ENCOUNTER — Ambulatory Visit: Payer: Medicare Other | Admitting: Physical Medicine & Rehabilitation

## 2017-01-18 ENCOUNTER — Encounter: Payer: Self-pay | Admitting: Internal Medicine

## 2017-01-18 ENCOUNTER — Non-Acute Institutional Stay (SKILLED_NURSING_FACILITY): Payer: Medicare Other | Admitting: Internal Medicine

## 2017-01-18 DIAGNOSIS — K5901 Slow transit constipation: Secondary | ICD-10-CM

## 2017-01-18 DIAGNOSIS — R791 Abnormal coagulation profile: Secondary | ICD-10-CM | POA: Diagnosis not present

## 2017-01-18 DIAGNOSIS — I10 Essential (primary) hypertension: Secondary | ICD-10-CM | POA: Insufficient documentation

## 2017-01-18 DIAGNOSIS — I82599 Chronic embolism and thrombosis of other specified deep vein of unspecified lower extremity: Secondary | ICD-10-CM

## 2017-01-18 DIAGNOSIS — E785 Hyperlipidemia, unspecified: Secondary | ICD-10-CM | POA: Diagnosis not present

## 2017-01-18 NOTE — Progress Notes (Signed)
LOCATION: [facility]  PCP: Oneal Grout, MD   Code Status: Full Code  Goals of care: Advanced Directive information Advanced Directives 12/08/2016  Does Patient Have a Medical Advance Directive? No;Yes  Type of Estate agent of [place];Living will  Would patient like information on creating a medical advance directive? -       Extended Emergency Contact Information Primary Emergency Contact: Cumming,Shirley Address: [address]          [place], [place] [address] Darden Amber of [place] Home Phone: [phone] Work Phone: [phone] Mobile Phone: [phone] Relation: Other Secondary Emergency Contact: Everitt Amber Address: [address]          [place], [place] [address] Darden Amber of [place] Home Phone: [phone] Mobile Phone: [phone] Relation: Daughter   Allergies  Allergen Reactions  . Latex Other (See Comments)    Per MAR  . Macrobid [employer] Macro] Other (See Comments)    Per MAR  . Morphine And Related Other (See Comments)    Per [facility]    Chief Complaint  Patient presents with  . Medical Management of Chronic Issues    Routine Visit      HPI:  Patient is a 71 y.o. male seen today for routine visit. He continues to be on coumadin and it has been difficult getting his INR in therapeutic range. He feeds himself with some assistance and is out of bed on a daily basis. No acute behavioral issues reported. He is compliant with his medications.   Review of Systems:  Constitutional: Negative for fever HENT: Negative for headache Respiratory: Negative for cough and dyspnea.  Cardiovascular: Negative for chest pain Gastrointestinal: Negative for heartburn, nausea, vomiting, abdominal pain. Positive for constipation and straining with bowel movement.  Genitourinary: Negative for dysuria. Skin: Negative for rash.  Neurological: Negative for dizziness.    Past Medical History:  Diagnosis  Date  . Cataract   . CKD (chronic kidney disease)   . Depression   . Diabetes mellitus without complication (HCC)   . Diabetic retinopathy (HCC)   . Hypertension   . Stroke [facility])     Medications: Allergies as of 01/18/2017      Reactions   Latex Other (See Comments)   Per MAR   Macrobid [nitrofurantoin Monohyd Macro] Other (See Comments)   Per MAR   Morphine And Related Other (See Comments)   Per [facility]      Medication List       Accurate as of 01/18/17  2:46 PM. Always use your most recent med list.          acetaminophen 325 MG tablet Commonly known as:  TYLENOL Take 650 mg by mouth every 4 (four) hours as needed for mild pain.   atorvastatin 10 MG tablet Commonly known as:  LIPITOR Take 1 tablet (10 mg total) by mouth daily at 6 PM.   baclofen 10 MG tablet Commonly known as:  LIORESAL Take 15 mg by mouth 3 (three) times daily.   Cholecalciferol 50000 units capsule Take 50,000 Units by mouth every 30 (thirty) days.   DECUBI-VITE Caps Take 1 capsule by mouth daily.   feeding supplement (PRO-STAT SUGAR FREE 64) Liqd Take 30 mLs by mouth 2 (two) times daily.   ferrous sulfate 325 (65 FE) MG tablet Take 325 mg by mouth daily with breakfast.   finasteride 5 MG tablet Commonly known as:  PROSCAR Take 5 mg by mouth every evening.   Lavender  Oil Oil 1 drop by Does not apply route as needed. Apply one drop behind each ear and on pillowcase as needed for agitation, anxiety, restlessness, insomnia. Discontinue use if skin irritation occurs   levETIRAcetam 250 MG tablet Commonly known as:  KEPPRA Take 250 mg by mouth 2 (two) times daily.   LORazepam 0.5 MG tablet Commonly known as:  ATIVAN Take 1 tablet (0.5 mg total) by mouth at bedtime.   losartan 50 MG tablet Commonly known as:  COZAAR Take 0.5 tablets (25 mg total) by mouth daily.   Melatonin 10 MG Tabs Take 10 mg by mouth at bedtime.   metFORMIN 1000 MG tablet Commonly known as:  GLUCOPHAGE Take 500  mg by mouth 2 (two) times daily with a meal.   metoprolol tartrate 25 MG tablet Commonly known as:  LOPRESSOR Take 0.5 tablets (12.5 mg total) by mouth 2 (two) times daily.   omeprazole 20 MG capsule Commonly known as:  PRILOSEC Take 20 mg by mouth daily.   Oxycodone HCl 10 MG Tabs Take one tablet by mouth every 6 hours as needed for severe pain   polyethylene glycol packet Commonly known as:  MIRALAX / GLYCOLAX Take 17 g by mouth daily.   PROCEL Powd Take 1 scoop by mouth 2 (two) times daily.   senna 8.6 MG tablet Commonly known as:  SENOKOT Take 2 tablets by mouth 2 (two) times daily.   sertraline 100 MG tablet Commonly known as:  ZOLOFT Take 150 mg by mouth daily.   solifenacin 5 MG tablet Commonly known as:  VESICARE Take 5 mg by mouth daily.   sucralfate 1 g tablet Commonly known as:  CARAFATE Take 1 g by mouth 4 (four) times daily -  with meals and at bedtime.   SYSTANE BALANCE 0.6 % Soln Generic drug:  Propylene Glycol Apply 1 drop to eye 2 (two) times daily.   tamsulosin 0.4 MG Caps capsule Commonly known as:  FLOMAX Take 0.4 mg by mouth daily after supper.   UNABLE TO FIND Med Name: Med pass 120 mL by mouth 2 times daily   ursodiol 500 MG tablet Commonly known as:  ACTIGALL Take 500 mg by mouth 2 (two) times daily.   vitamin B-12 1000 MCG tablet Commonly known as:  CYANOCOBALAMIN Take 1,000 mcg by mouth daily.   Zinc Oxide 12.8 % ointment Commonly known as:  TRIPLE PASTE Apply 1 application topically 3 (three) times daily.       Immunizations: Immunization History  Administered Date(s) Administered  . Influenza-Unspecified 07/15/2015  . Pneumococcal-Unspecified 09/02/2013     Physical Exam: Vitals:   01/18/17 1431  BP: 137/69  Pulse: 68  Resp: 18  Temp: 97.4 F (36.3 C)  TempSrc: Oral  SpO2: 96%  Weight: 162 lb 6.4 oz (73.7 kg)  Height:  (1.93 m)   Body mass index is 19.77 kg/m.   General- elderly male, frail, thin  built, in no acute distress Head- normocephalic, atraumatic Nose- no nasal discharge Throat- moist mucus membrane Eyes- no pallor, no icterus, no discharge, normal conjunctiva, normal sclera Neck- no cervical lymphadenopathy Cardiovascular- normal s1,s2, + systolic murmur Respiratory- bilateral clear to auscultation Abdomen- bowel sounds present, soft, non tender Musculoskeletal- right sided hemiplegia and spasticity Neurological- alert and oriented to self only Skin- warm and dry Psychiatry- calm this visit   Labs reviewed: Basic Metabolic Panel:  Recent Labs  04/54/09 0338 07/07/16 0156 07/07/16 0157 07/08/16 0618  11/14/16 11/24/16 12/02/16  NA 134* 137  134* 136  < > 146 143 143  K 3.6 4.2 4.2 4.0  < > 3.0* 3.4 3.6  CL 99* 101 100* 103  --   --   --   --   CO2 27  --  25 27  --   --   --   --   GLUCOSE 116* 86 92 99  --   --   --   --   BUN 16 26* 23* 20  < > 52* 25* 30*  CREATININE 0.86 0.90 0.97 0.76  < > 1.1 0.9 0.7  CALCIUM 8.9  --  9.4 9.0  --   --   --   --   < > = values in this interval not displayed. Liver Function Tests:  Recent Labs  06/30/16 1616 07/01/16 0338 07/07/16 0157 12/02/16  AST 13*  ALT 13* 12* 17 8*  ALKPHOS 142* 134* 140* 70  BILITOT 0.6 0.7 0.6  --   PROT 6.6 6.2* 6.8  --   ALBUMIN 3.5 3.3* 3.8  --    No results for input(s): LIPASE, AMYLASE in the last 8760 hours. No results for input(s): AMMONIA in the last 8760 hours. CBC:  Recent Labs  06/30/16 1616  07/07/16 0157 07/08/16 0618 07/09/16 0337 07/10/16 0347  11/14/16 11/24/16 12/02/16  WBC 7.6  < > 9.5 5.7 6.4 6.5  < > 14.6 8.0 6.1  NEUTROABS 4.9  --  5.1  --   --   --   --   --   --   --   HGB 12.0*  < > 12.4* 11.2* 10.8* 12.0*  < > 8.4* 9.2* 9.4*  HCT 37.1*  < > 38.0* 34.8* 34.3* 37.4*  < > 28* 31* 31*  MCV 87.7  < > 89.0 88.3 88.2 87.6  --   --   --   --   PLT 253  < > 258 209 212 224  < > 449* 409* 277  < > = values in this interval not displayed. Cardiac  Enzymes:  Recent Labs  06/30/16 1616  TROPONINI <0.03   BNP: Invalid input(s): POCBNP CBG:  Recent Labs  07/09/16 2056 07/10/16 0623 07/10/16 1122  GLUCAP 149* 104* 140*      Assessment/Plan  Constipation Staff have noticed patient to be straining during bowel movement. Currently on MiraLAX daily. Add Colace 100 mg twice a day and monitor.   Supratherapeutic INR INR today 6. No bleeding reported. Hold Coumadin for today and check INR 01/19/17   Essential hypertension Reviewed blood pressure reading, overall stable. Continue Cozaar 25 mg daily and monitor.  DVT  Currently on coumadin, unclear about time of onset of his DVT. Given the difficulty keeping his INR in therapeutic range, will need review of goals of care with him and his family  Hyperlipidemia Lipid Panel     Component Value Date/Time   CHOL 77 12/02/2016   TRIG 106 12/02/2016   HDL 34 (A) 12/02/2016   CHOLHDL 3.4 07/01/2016 0338   VLDL 12 07/01/2016 0338   LDLCALC 22 12/02/2016   LDL at goal. Currently on atorvastatin 10 mg daily at bedtime. Continue current regimen.  Oneal Grout, MD Internal Medicine [facility] [address] [place], [place] [address] Cell Phone (Monday-Friday 8 am - 5 pm): [phone] On Call: [phone] and follow prompts after 5 pm and on weekends Office Phone: [phone] Office Fax: [phone]

## 2017-01-19 ENCOUNTER — Encounter: Payer: Medicare Other | Attending: Physical Medicine & Rehabilitation

## 2017-01-19 ENCOUNTER — Ambulatory Visit (HOSPITAL_BASED_OUTPATIENT_CLINIC_OR_DEPARTMENT_OTHER): Payer: Medicare Other | Admitting: Physical Medicine & Rehabilitation

## 2017-01-19 ENCOUNTER — Encounter: Payer: Self-pay | Admitting: Physical Medicine & Rehabilitation

## 2017-01-19 VITALS — BP 126/77 | HR 76 | Resp 14

## 2017-01-19 DIAGNOSIS — N189 Chronic kidney disease, unspecified: Secondary | ICD-10-CM | POA: Diagnosis not present

## 2017-01-19 DIAGNOSIS — I129 Hypertensive chronic kidney disease with stage 1 through stage 4 chronic kidney disease, or unspecified chronic kidney disease: Secondary | ICD-10-CM | POA: Insufficient documentation

## 2017-01-19 DIAGNOSIS — M7542 Impingement syndrome of left shoulder: Secondary | ICD-10-CM

## 2017-01-19 DIAGNOSIS — I69251 Hemiplegia and hemiparesis following other nontraumatic intracranial hemorrhage affecting right dominant side: Secondary | ICD-10-CM | POA: Diagnosis present

## 2017-01-19 DIAGNOSIS — E11319 Type 2 diabetes mellitus with unspecified diabetic retinopathy without macular edema: Secondary | ICD-10-CM | POA: Insufficient documentation

## 2017-01-19 DIAGNOSIS — F329 Major depressive disorder, single episode, unspecified: Secondary | ICD-10-CM | POA: Diagnosis not present

## 2017-01-19 DIAGNOSIS — E1122 Type 2 diabetes mellitus with diabetic chronic kidney disease: Secondary | ICD-10-CM | POA: Diagnosis not present

## 2017-01-19 DIAGNOSIS — G8110 Spastic hemiplegia affecting unspecified side: Secondary | ICD-10-CM

## 2017-01-19 NOTE — Progress Notes (Signed)
Subjective:    Patient ID: Gilbert Lewis, male    DOB: March 24, 1946, 71 y.o.   MRN: [id number]  HPI Botox injection performed 12/20/2016 Overall improvement in spasticity and pain following injection. Spoke to his physiatrist at  nursing facility who has noted some increased activity level and ability to participate with physical therapy.  50 units, right FDS 50 units, right FDP 50 units, right FCR 25 units, right FPL 25 units, right FCU  75U Right tib post  Patient is aphasic accompanied by his daughter today.  Pain Inventory Average Pain 9 Pain Right Now 9 My pain is constant  In the last 24 hours, has pain interfered with the following? General activity 0 Relation with others 0 Enjoyment of life 0 What TIME of day is your pain at its worst? evening Sleep (in general) Poor  Pain is worse with: inactivity and unsure Pain improves with: medication Relief from Meds: 8  Mobility use a wheelchair needs help with transfers  Function disabled: date disabled . retired I need assistance with the following:  feeding, dressing, bathing, toileting, meal prep, household duties and shopping  Neuro/Psych weakness tremor tingling trouble walking spasms confusion depression  Prior Studies Any changes since last visit?  no  Physicians involved in your care Any changes since last visit?  no   Family History  Problem Relation Age of Onset  . Family history unknown: Yes   Social History   Social History  . Marital status: Single    Spouse name: N/A  . Number of children: N/A  . Years of education: N/A   Social History Main Topics  . Smoking status: Never Smoker  . Smokeless tobacco: Never Used  . Alcohol use No  . Drug use: No  . Sexual activity: No   Other Topics Concern  . None   Social History Narrative  . None   History reviewed. No pertinent surgical history. Past Medical History:  Diagnosis Date  . Cataract   . CKD (chronic kidney disease)     . Depression   . Diabetes mellitus without complication (HCC)   . Diabetic retinopathy (HCC)   . Hypertension   . Stroke (HCC)    BP 126/77 (BP Location: Left Arm, Patient Position: Sitting, Cuff Size: Normal)   Pulse 76   Resp 14   SpO2 97%   Opioid Risk Score:   Fall Risk Score:  `1  Depression screen PHQ 2/9  No flowsheet data found.  Review of Systems  Constitutional: Positive for appetite change, fatigue and unexpected weight change.  Respiratory: Negative.   Cardiovascular: Positive for leg swelling.  Gastrointestinal: Negative.   Endocrine: Negative.   Genitourinary: Negative.   Musculoskeletal: Positive for gait problem and myalgias.       Spasticity  Skin: Positive for rash and wound.  Allergic/Immunologic: Positive for environmental allergies.  Neurological: Positive for tremors and weakness.       Tingling  Hematological: Bruises/bleeds easily.  Psychiatric/Behavioral: Positive for agitation, confusion, decreased concentration and dysphoric mood.       Objective:   Physical Exam  Constitutional: He appears well-developed and well-nourished.  HENT:  Head: Normocephalic and atraumatic.  Eyes: Conjunctivae and EOM are normal. Pupils are equal, round, and reactive to light.  Musculoskeletal:       Right shoulder: He exhibits decreased range of motion, tenderness, deformity and decreased strength.  Right shoulder has one finger breath subluxation. This pain with shoulder range of motion  Neurological: He is alert.  Expressive aphasia, does follow simple commands. Gait not tested. No assisted device on the wheelchair. Right posterior custom molded plastic AFO with ankle inversion strap  Motor strength is essentially 0/5 in the right deltoid, biceps, triceps, grip, 3 minus in the right knee extensor, 0 at the ankle dorsiflexor, plantar flexor  Psychiatric: He has a normal mood and affect.  Nursing note and vitals reviewed.  Ashworth 3. Right pectoralis,  right finger flexors, right wrist flexors and right pronator Ashworth 1 at the elbow flexors Ashworth 4 at the foot inverters        Assessment & Plan:  1. 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 His right lower extremity quite embarrassed. Spasticity did not improve greatly after the Botox injection. Therefore, I would recommend increasing pectoralis dose to 150 units and injecting right pronator teres  50 units, right FDS 50 units, right FDP 50 units, right FCR 50 units, right Pronator teres 25 units, right FPL 25 units, right FCU 150 U right Pectoralis  2. Right shoulder pain post stroke, positive subluxation. Positive contracture, positive impingement signs. We will inject today  Shoulder injection Right    Indication:Right Shoulder pain not relieved by medication management and other conservative care.  Informed consent was obtained after describing risks and benefits of the procedure with the patient, this includes bleeding, bruising, infection and medication side effects. The patient wishes to proceed and has given written consent. Patient was placed in a seated position. The Right shoulder was marked and prepped with betadine in the subacromial area. A 25-gauge 1-1/2 inch needle was inserted into the subacromial area. After negative draw back for blood, a solution containing 1 mL of 6 mg per ML betamethasone and 4 mL of 1% lidocaine was injected. A band aid was applied. The patient tolerated the procedure well. Post procedure instructions were given.

## 2017-01-19 NOTE — Patient Instructions (Signed)
Right subacromial injection with Celestone and lidocaine form today for right shoulder pain  Plan is for repeat Botox in 4 weeks. Will not inject the right tibialis, posterior, but put extra Botox in the right pectoralis and right pronator, teres muscles.

## 2017-03-09 ENCOUNTER — Encounter: Payer: Self-pay | Admitting: Physical Medicine & Rehabilitation

## 2017-03-09 ENCOUNTER — Encounter: Payer: Medicare Other | Attending: Physical Medicine & Rehabilitation

## 2017-03-09 ENCOUNTER — Ambulatory Visit (HOSPITAL_BASED_OUTPATIENT_CLINIC_OR_DEPARTMENT_OTHER): Payer: Medicare Other | Admitting: Physical Medicine & Rehabilitation

## 2017-03-09 VITALS — BP 146/76 | HR 60

## 2017-03-09 DIAGNOSIS — I129 Hypertensive chronic kidney disease with stage 1 through stage 4 chronic kidney disease, or unspecified chronic kidney disease: Secondary | ICD-10-CM | POA: Insufficient documentation

## 2017-03-09 DIAGNOSIS — E1122 Type 2 diabetes mellitus with diabetic chronic kidney disease: Secondary | ICD-10-CM | POA: Diagnosis not present

## 2017-03-09 DIAGNOSIS — E11319 Type 2 diabetes mellitus with unspecified diabetic retinopathy without macular edema: Secondary | ICD-10-CM | POA: Insufficient documentation

## 2017-03-09 DIAGNOSIS — N189 Chronic kidney disease, unspecified: Secondary | ICD-10-CM | POA: Diagnosis not present

## 2017-03-09 DIAGNOSIS — I69251 Hemiplegia and hemiparesis following other nontraumatic intracranial hemorrhage affecting right dominant side: Secondary | ICD-10-CM | POA: Insufficient documentation

## 2017-03-09 DIAGNOSIS — G8110 Spastic hemiplegia affecting unspecified side: Secondary | ICD-10-CM | POA: Diagnosis not present

## 2017-03-09 DIAGNOSIS — F329 Major depressive disorder, single episode, unspecified: Secondary | ICD-10-CM | POA: Insufficient documentation

## 2017-03-09 NOTE — Patient Instructions (Signed)

## 2017-03-09 NOTE — Progress Notes (Signed)
Botox Injection for spasticity using needle EMG guidance  Dilution: 50 Units/ml Indication: Severe spasticity which interferes with ADL,mobility and/or  hygiene and is unresponsive to medication management and other conservative care Informed consent was obtained after describing risks and benefits of the procedure with the patient. This includes bleeding, bruising, infection, excessive weakness, or medication side effects. A REMS form is on file and signed. Needle: 27g 1" needle electrode Number of units per muscle 50 units, right FDS 50 units, right FDP 50 units, right FCR 50 units, right Pronator teres 25 units, right FPL 25 units, right FCU 150 U right Pectoralis  EMG activity. Pectoralis, 3 plus Pronator teres 3 plus Pronator quadratus, 3 plus FDS 2 plus FCR, 2 plus FCU, 1 plus, FDP, 1 plus, All injections were done after obtaining appropriate EMG activity and after negative drawback for blood. The patient tolerated the procedure well. Post procedure instructions were given. A followup appointment was made.

## 2017-04-20 ENCOUNTER — Encounter: Payer: Self-pay | Admitting: Physical Medicine & Rehabilitation

## 2017-04-20 ENCOUNTER — Ambulatory Visit (HOSPITAL_BASED_OUTPATIENT_CLINIC_OR_DEPARTMENT_OTHER): Payer: Medicare Other | Admitting: Physical Medicine & Rehabilitation

## 2017-04-20 ENCOUNTER — Encounter: Payer: Medicare Other | Attending: Physical Medicine & Rehabilitation

## 2017-04-20 VITALS — BP 144/75 | HR 61

## 2017-04-20 DIAGNOSIS — G8110 Spastic hemiplegia affecting unspecified side: Secondary | ICD-10-CM

## 2017-04-20 DIAGNOSIS — N189 Chronic kidney disease, unspecified: Secondary | ICD-10-CM | POA: Insufficient documentation

## 2017-04-20 DIAGNOSIS — I69920 Aphasia following unspecified cerebrovascular disease: Secondary | ICD-10-CM

## 2017-04-20 DIAGNOSIS — F329 Major depressive disorder, single episode, unspecified: Secondary | ICD-10-CM | POA: Diagnosis not present

## 2017-04-20 DIAGNOSIS — E1122 Type 2 diabetes mellitus with diabetic chronic kidney disease: Secondary | ICD-10-CM | POA: Insufficient documentation

## 2017-04-20 DIAGNOSIS — E11319 Type 2 diabetes mellitus with unspecified diabetic retinopathy without macular edema: Secondary | ICD-10-CM | POA: Insufficient documentation

## 2017-04-20 DIAGNOSIS — I69251 Hemiplegia and hemiparesis following other nontraumatic intracranial hemorrhage affecting right dominant side: Secondary | ICD-10-CM | POA: Insufficient documentation

## 2017-04-20 DIAGNOSIS — I129 Hypertensive chronic kidney disease with stage 1 through stage 4 chronic kidney disease, or unspecified chronic kidney disease: Secondary | ICD-10-CM | POA: Insufficient documentation

## 2017-04-20 NOTE — Progress Notes (Signed)
Subjective:    Patient ID: Gilbert Lewis, male    DOB: May 07, 1946, 71 y.o.   MRN: [id number] 03/09/17 Botox injection Botox Injection for spasticity using needle EMG guidance  Dilution: 50 Units/ml Indication: Severe spasticity which interferes with ADL,mobility and/or  hygiene and is unresponsive to medication management and other conservative care Informed consent was obtained after describing risks and benefits of the procedure with the patient. This includes bleeding, bruising, infection, excessive weakness, or medication side effects. A REMS form is on file and signed. Needle: 27g 1" needle electrode Number of units per muscle 50 units, right FDS 50 units, right FDP 50 units, right FCR 50 units, right Pronator teres 25 units, right FPL 25 units, right FCU 150 U right Pectoralis  EMG activity. Pectoralis, 3 plus Pronator teres 3 plus Pronator quadratus, 3 plus FDS 2 plus FCR, 2 plus FCU, 1 plus, FDP, 1 plus,  HPI  Patient is aphasic, gives occasional single word answer. Is able to express pain Pain Inventory Average Pain 8 Pain Right Now 8 My pain is constant  In the last 24 hours, has pain interfered with the following? General activity 0 Relation with others 0 Enjoyment of life 0 What TIME of day is your pain at its worst? n/a Sleep (in general) Fair  Pain is worse with: walking, bending, sitting and some activites Pain improves with: medication Relief from Meds: 1  Mobility how many minutes can you walk? 0 ability to climb steps?  no do you drive?  no use a wheelchair needs help with transfers  Function disabled: date disabled unknown  Neuro/Psych weakness tremor spasms  Prior Studies Any changes since last visit?  no  Physicians involved in your care Any changes since last visit?  no   Family History  Problem Relation Age of Onset  . Family history unknown: Yes   Social History   Social History  . Marital status: Single    Spouse  name: N/A  . Number of children: N/A  . Years of education: N/A   Social History Main Topics  . Smoking status: Never Smoker  . Smokeless tobacco: Never Used  . Alcohol use No  . Drug use: No  . Sexual activity: No   Other Topics Concern  . Not on file   Social History Narrative  . No narrative on file   No past surgical history on file. Past Medical History:  Diagnosis Date  . Cataract   . CKD (chronic kidney disease)   . Depression   . Diabetes mellitus without complication (HCC)   . Diabetic retinopathy (HCC)   . Hypertension   . Stroke [facility])    There were no vitals taken for this visit.  Opioid Risk Score:   Fall Risk Score:  `1  Depression screen PHQ 2/9  No flowsheet data found.   Review of Systems  All other systems reviewed and are negative.      Objective:   Physical Exam   Tone Right pectoralis Ashworth 2/3 Right FDS, Ashworth 0/1 Right FDP, Ashworth 2 at the fifth digit. Otherwise, Ashworth 1 Right FCR, Ashworth 1 Right FCU, Ashworth 0 Right FPL, Ashworth 0 Right pronator, teres/quadratus Ashworth 3/4  Patient has pain with right shoulder range of motion Patient has pain with attempted supination of the right arm  Speech is aphasic, single word       Assessment & Plan:   1. Right spastic hemiplegia improvements with Botox. We'll make following dosage adjustments for next  treatment not to be done prior to 06/09/2017. Right pectoralis, 200 Right FDS 50 Right FDP 25 Right FCR 50 Right pronator teres 50 Right pronator quadratus, 25

## 2017-04-20 NOTE — Patient Instructions (Signed)
We'll make some dosage adjustments for next Botox injection

## 2017-06-15 ENCOUNTER — Ambulatory Visit (HOSPITAL_BASED_OUTPATIENT_CLINIC_OR_DEPARTMENT_OTHER): Payer: Medicare Other | Admitting: Physical Medicine & Rehabilitation

## 2017-06-15 ENCOUNTER — Encounter: Payer: Self-pay | Admitting: Physical Medicine & Rehabilitation

## 2017-06-15 ENCOUNTER — Encounter: Payer: Medicare Other | Attending: Physical Medicine & Rehabilitation

## 2017-06-15 VITALS — BP 135/75 | HR 66

## 2017-06-15 DIAGNOSIS — E11319 Type 2 diabetes mellitus with unspecified diabetic retinopathy without macular edema: Secondary | ICD-10-CM | POA: Insufficient documentation

## 2017-06-15 DIAGNOSIS — F329 Major depressive disorder, single episode, unspecified: Secondary | ICD-10-CM | POA: Diagnosis not present

## 2017-06-15 DIAGNOSIS — E1122 Type 2 diabetes mellitus with diabetic chronic kidney disease: Secondary | ICD-10-CM | POA: Diagnosis not present

## 2017-06-15 DIAGNOSIS — I129 Hypertensive chronic kidney disease with stage 1 through stage 4 chronic kidney disease, or unspecified chronic kidney disease: Secondary | ICD-10-CM | POA: Insufficient documentation

## 2017-06-15 DIAGNOSIS — N189 Chronic kidney disease, unspecified: Secondary | ICD-10-CM | POA: Diagnosis not present

## 2017-06-15 DIAGNOSIS — I69251 Hemiplegia and hemiparesis following other nontraumatic intracranial hemorrhage affecting right dominant side: Secondary | ICD-10-CM | POA: Insufficient documentation

## 2017-06-15 DIAGNOSIS — G8110 Spastic hemiplegia affecting unspecified side: Secondary | ICD-10-CM

## 2017-06-15 NOTE — Patient Instructions (Addendum)
Botox Injection for spasticity using needle EMG guidance  Dilution: 50 Units/ml Indication: Severe spasticity which interferes with ADL,mobility and/or  hygiene and is unresponsive to medication management and other conservative care Informed consent was obtained after describing risks and benefits of the procedure with the patient. This includes bleeding, bruising, infection, excessive weakness, or medication side effects. A REMS form is on file and signed. Needle: 27g 1" needle electrode Number of units per muscle Right pectoralis, 200 Right FDS 50 Right FDP 25 Right FCR 50 Right pronator teres 50 Right pronator quadratus, 25 All injections were done after obtaining appropriate EMG activity and after negative drawback for blood. The patient tolerated the procedure well. Post procedure instructions were given. A followup appointment was made.   The EMG activity was greatest in the right pectoralis, clavicular head as well as FCR. May consider reducing upper extremity dosage next time. Depending on clinical response 

## 2017-06-15 NOTE — Progress Notes (Signed)
Botox Injection for spasticity using needle EMG guidance  Dilution: 50 Units/ml Indication: Severe spasticity which interferes with ADL,mobility and/or  hygiene and is unresponsive to medication management and other conservative care Informed consent was obtained after describing risks and benefits of the procedure with the patient. This includes bleeding, bruising, infection, excessive weakness, or medication side effects. A REMS form is on file and signed. Needle: 27g 1" needle electrode Number of units per muscle Right pectoralis, 200 Right FDS 50 Right FDP 25 Right FCR 50 Right pronator teres 50 Right pronator quadratus, 25 All injections were done after obtaining appropriate EMG activity and after negative drawback for blood. The patient tolerated the procedure well. Post procedure instructions were given. A followup appointment was made.   The EMG activity was greatest in the right pectoralis, clavicular head as well as FCR. May consider reducing upper extremity dosage next time. Depending on clinical response

## 2017-06-19 ENCOUNTER — Emergency Department (HOSPITAL_COMMUNITY): Payer: Medicare Other

## 2017-06-19 ENCOUNTER — Inpatient Hospital Stay (HOSPITAL_COMMUNITY)
Admission: EM | Admit: 2017-06-19 | Discharge: 2017-06-21 | DRG: 727 | Disposition: A | Discharge status: Skilled Nursing Facility | Payer: Medicare Other | Attending: Family Medicine | Admitting: Family Medicine

## 2017-06-19 ENCOUNTER — Encounter (HOSPITAL_COMMUNITY): Payer: Self-pay | Admitting: Emergency Medicine

## 2017-06-19 DIAGNOSIS — N183 Chronic kidney disease, stage 3 unspecified: Secondary | ICD-10-CM | POA: Diagnosis present

## 2017-06-19 DIAGNOSIS — Z91040 Latex allergy status: Secondary | ICD-10-CM

## 2017-06-19 DIAGNOSIS — N390 Urinary tract infection, site not specified: Secondary | ICD-10-CM | POA: Diagnosis present

## 2017-06-19 DIAGNOSIS — Z881 Allergy status to other antibiotic agents status: Secondary | ICD-10-CM

## 2017-06-19 DIAGNOSIS — N452 Orchitis: Principal | ICD-10-CM | POA: Diagnosis present

## 2017-06-19 DIAGNOSIS — E785 Hyperlipidemia, unspecified: Secondary | ICD-10-CM | POA: Diagnosis present

## 2017-06-19 DIAGNOSIS — N410 Acute prostatitis: Secondary | ICD-10-CM | POA: Diagnosis present

## 2017-06-19 DIAGNOSIS — Z885 Allergy status to narcotic agent status: Secondary | ICD-10-CM

## 2017-06-19 DIAGNOSIS — K219 Gastro-esophageal reflux disease without esophagitis: Secondary | ICD-10-CM | POA: Diagnosis present

## 2017-06-19 DIAGNOSIS — G9341 Metabolic encephalopathy: Secondary | ICD-10-CM | POA: Diagnosis present

## 2017-06-19 DIAGNOSIS — I69351 Hemiplegia and hemiparesis following cerebral infarction affecting right dominant side: Secondary | ICD-10-CM

## 2017-06-19 DIAGNOSIS — I10 Essential (primary) hypertension: Secondary | ICD-10-CM | POA: Diagnosis present

## 2017-06-19 DIAGNOSIS — E1122 Type 2 diabetes mellitus with diabetic chronic kidney disease: Secondary | ICD-10-CM | POA: Diagnosis present

## 2017-06-19 DIAGNOSIS — I69920 Aphasia following unspecified cerebrovascular disease: Secondary | ICD-10-CM

## 2017-06-19 DIAGNOSIS — B379 Candidiasis, unspecified: Secondary | ICD-10-CM | POA: Diagnosis present

## 2017-06-19 DIAGNOSIS — Z86718 Personal history of other venous thrombosis and embolism: Secondary | ICD-10-CM

## 2017-06-19 DIAGNOSIS — Z87440 Personal history of urinary (tract) infections: Secondary | ICD-10-CM

## 2017-06-19 DIAGNOSIS — I129 Hypertensive chronic kidney disease with stage 1 through stage 4 chronic kidney disease, or unspecified chronic kidney disease: Secondary | ICD-10-CM | POA: Diagnosis present

## 2017-06-19 DIAGNOSIS — K5909 Other constipation: Secondary | ICD-10-CM | POA: Diagnosis present

## 2017-06-19 DIAGNOSIS — Z7901 Long term (current) use of anticoagulants: Secondary | ICD-10-CM

## 2017-06-19 DIAGNOSIS — F329 Major depressive disorder, single episode, unspecified: Secondary | ICD-10-CM | POA: Diagnosis present

## 2017-06-19 DIAGNOSIS — N3090 Cystitis, unspecified without hematuria: Secondary | ICD-10-CM

## 2017-06-19 DIAGNOSIS — E118 Type 2 diabetes mellitus with unspecified complications: Secondary | ICD-10-CM | POA: Diagnosis present

## 2017-06-19 DIAGNOSIS — N400 Enlarged prostate without lower urinary tract symptoms: Secondary | ICD-10-CM | POA: Diagnosis present

## 2017-06-19 DIAGNOSIS — E11319 Type 2 diabetes mellitus with unspecified diabetic retinopathy without macular edema: Secondary | ICD-10-CM | POA: Diagnosis present

## 2017-06-19 DIAGNOSIS — Z7984 Long term (current) use of oral hypoglycemic drugs: Secondary | ICD-10-CM

## 2017-06-19 DIAGNOSIS — N50819 Testicular pain, unspecified: Secondary | ICD-10-CM

## 2017-06-19 LAB — URINALYSIS, ROUTINE W REFLEX MICROSCOPIC
Bilirubin Urine: NEGATIVE
GLUCOSE, UA: NEGATIVE mg/dL
Ketones, ur: NEGATIVE mg/dL
NITRITE: NEGATIVE
PROTEIN: 100 mg/dL — AB
SPECIFIC GRAVITY, URINE: 1.01 (ref 1.005–1.030)
SQUAMOUS EPITHELIAL / LPF: NONE SEEN
pH: 5 (ref 5.0–8.0)

## 2017-06-19 LAB — COMPREHENSIVE METABOLIC PANEL
ALT: 12 U/L — ABNORMAL LOW (ref 17–63)
ANION GAP: 11 (ref 5–15)
AST: 19 U/L (ref 15–41)
Albumin: 3.6 g/dL (ref 3.5–5.0)
Alkaline Phosphatase: 74 U/L (ref 38–126)
BILIRUBIN TOTAL: 0.6 mg/dL (ref 0.3–1.2)
BUN: 32 mg/dL — AB (ref 6–20)
CHLORIDE: 107 mmol/L (ref 101–111)
CO2: 25 mmol/L (ref 22–32)
Calcium: 9.2 mg/dL (ref 8.9–10.3)
Creatinine, Ser: 1.07 mg/dL (ref 0.61–1.24)
Glucose, Bld: 130 mg/dL — ABNORMAL HIGH (ref 65–99)
POTASSIUM: 3.5 mmol/L (ref 3.5–5.1)
Sodium: 143 mmol/L (ref 135–145)
TOTAL PROTEIN: 6.9 g/dL (ref 6.5–8.1)

## 2017-06-19 LAB — CBC
HEMATOCRIT: 34.9 % — AB (ref 39.0–52.0)
HEMOGLOBIN: 11.3 g/dL — AB (ref 13.0–17.0)
MCH: 27.6 pg (ref 26.0–34.0)
MCHC: 32.4 g/dL (ref 30.0–36.0)
MCV: 85.3 fL (ref 78.0–100.0)
Platelets: 280 10*3/uL (ref 150–400)
RBC: 4.09 MIL/uL — AB (ref 4.22–5.81)
RDW: 14.4 % (ref 11.5–15.5)
WBC: 11.7 10*3/uL — AB (ref 4.0–10.5)

## 2017-06-19 LAB — I-STAT CG4 LACTIC ACID, ED
LACTIC ACID, VENOUS: 1.57 mmol/L (ref 0.5–1.9)
Lactic Acid, Venous: 1.1 mmol/L (ref 0.5–1.9)

## 2017-06-19 LAB — LIPASE, BLOOD: LIPASE: 27 U/L (ref 11–51)

## 2017-06-19 MED ORDER — IOPAMIDOL (ISOVUE-300) INJECTION 61%
INTRAVENOUS | Status: AC
  Administered 2017-06-19: 100 mL
  Filled 2017-06-19: qty 100

## 2017-06-19 MED ORDER — NYSTATIN 100000 UNIT/GM EX POWD
Freq: Once | CUTANEOUS | Status: AC
  Administered 2017-06-20: 04:00:00 via TOPICAL
  Filled 2017-06-19: qty 15

## 2017-06-19 NOTE — ED Notes (Signed)
Pt to US.

## 2017-06-19 NOTE — ED Notes (Signed)
Pt returned to room at this time

## 2017-06-19 NOTE — ED Notes (Signed)
Pt has urinal in between legs trying to give a sample.  Ready for CT.

## 2017-06-19 NOTE — ED Notes (Signed)
Family back to bedside. Pt is in CT. MD speaking with family.

## 2017-06-19 NOTE — ED Notes (Signed)
Daughter and wife now at bedside. State that earlier today the [facility] tech notified them of fever 103.2 he possibly received tylenol. Has hx of recurrent UTIs.   Pt was not allowed anything by mouth and kept NPO.

## 2017-06-19 NOTE — ED Triage Notes (Signed)
Pt w/ [facility]- Pt from [facility] with lower abdominal pain, testicular pain and right arm with pain.  Per Staff pt not as talkative today which is not his norm. Denies injury but won't move extremity at this present time. Received 50 ml NS fluid. Hx of tremors. Vitals stable. LKW unknown.

## 2017-06-19 NOTE — ED Notes (Signed)
Pt returned from CT. Awaiting [place]. Family at bedside.

## 2017-06-19 NOTE — ED Notes (Signed)
Pt to CT. VSS.

## 2017-06-19 NOTE — ED Provider Notes (Signed)
[facility]-EMERGENCY DEPT Provider Note   CSN: [id number] Arrival date & time: 06/19/17  1743     History   Chief Complaint Chief Complaint  Patient presents with  . Abdominal Pain  . Testicle Pain  . Tremors    HPI Gilbert Lewis is a 72 y.o. male with a h/o of spastic hemiplegia, DM, and CKD who presents to the emergency department from [facility] with a chief complaint of constant, worsening abdominal pain with associated testicular pain. The patient is unable to state how long the testicular pain has been present, but his wife reports that he was not complaining of the pain 3 days ago. No N/V/D, back pain, dysuria, or chills.  [facility] staff reports the patient is generally talkative at baseline, but was not as talkative today. Staff reports a temperature of 100 prior to calling EMS. No treatment PTA other than his scheduled pain medication at 2 PM that contains Tylenol.  The patient's wife reports a h/o of recurrent UTIs. Urine culture in 2012 with ESBL E. Coli.   Level V caveat. The patient is a poor historian.   The history is provided by the spouse and the patient. No language interpreter was used.    Past Medical History:  Diagnosis Date  . Cataract   . CKD (chronic kidney disease)   . Depression   . Diabetes mellitus without complication (HCC)   . Diabetic retinopathy (HCC)   . Hypertension   . Stroke [facility])     Patient Active Problem List   Diagnosis Date Noted  . Acute lower UTI 06/20/2017  . Orchitis 06/20/2017  . Acute metabolic encephalopathy 06/20/2017  . Subacromial impingement, left 01/19/2017  . Constipation, slow transit 01/18/2017  . Hyperlipidemia LDL goal <70 01/18/2017  . Essential hypertension 01/18/2017  . History of pressure ulcer 12/09/2016  . Moderate protein-calorie malnutrition (HCC) 12/09/2016  . Chronic GERD 12/09/2016  . Spastic hemiplegia affecting dominant side (HCC) 11/22/2016  . Elevated BUN   . Normochromic normocytic anemia     . Subtherapeutic international normalized ratio (INR)   . TIA (transient ischemic attack) 07/07/2016  . Diabetes mellitus with complication (HCC) 07/07/2016  . Dysarthria 07/07/2016  . Seizures (HCC) 07/07/2016  . Stroke-like symptoms   . Pressure injury of skin 07/01/2016  . DVT (deep venous thrombosis) (HCC) 06/30/2016  . Slurred speech   . Benign hypertensive heart disease without heart failure 06/13/2016  . 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 Type 2 diabetes mellitus with stage 3 chronic kidney disease, without long-term current use of insulin (HCC) 06/13/2016  . Major depression, chronic 06/13/2016  . BPH (benign prostatic hyperplasia) 06/13/2016  . OAB (overactive bladder) 06/13/2016  . Anemia, iron deficiency 06/13/2016  . 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 Insomnia 06/13/2016  . Chronic gastric ulcer 06/13/2016  . CKD (chronic kidney disease) stage 3, GFR 30-59 ml/min 06/13/2016  . Chronic constipation 06/13/2016  . Chronic hyponatremia 06/13/2016  . Chronic pain syndrome 06/13/2016  . Calculus of gallbladder and bile duct with obstruction without cholecystitis 06/13/2016  . History of DVT (deep vein thrombosis) 06/13/2016    Past Surgical History:  Procedure Laterality Date  . APPENDECTOMY    . SKIN BIOPSY         Home Medications    Prior to Admission medications   Medication Sig Start Date End Date Taking? Authorizing Provider  acetaminophen (TYLENOL) 325 MG tablet Take 650 mg by mouth every 4 (four) hours as needed for mild pain.   Yes [provider]  atorvastatin (LIPITOR) 10 MG tablet Take 1 tablet (10 mg total) by mouth daily at 6 PM. 07/09/16  Yes Filbert Schilder, MD  baclofen (LIORESAL) 10 MG tablet Take 15 mg by mouth 3 (three) times daily. 0600, 1400, 2200   Yes [provider]  Cholecalciferol 50000 units capsule Take 50,000 Units by mouth every 30 (thirty) days.   Yes [provider]  docusate sodium (COLACE) 100 MG capsule Take 100 mg by mouth 2 (two) times daily.   Yes [provider]  ferrous sulfate 325 (65 FE) MG tablet Take 325 mg by mouth daily with breakfast.   Yes [provider]  finasteride (PROSCAR) 5 MG tablet Take 5 mg by mouth every evening.    Yes [provider]  Lavender Oil OIL 1 drop by Does not apply route as needed. Apply one drop behind each ear and on pillowcase as needed for agitation, anxiety, restlessness, insomnia. Discontinue use if skin irritation occurs   Yes [provider]  levETIRAcetam (KEPPRA) 250 MG tablet Take 250 mg by mouth 2 (two) times daily.   Yes [provider]  LORazepam (ATIVAN) 0.5 MG tablet Take 1 tablet (0.5 mg total) by mouth at bedtime. 11/10/16  Yes Sharon Seller, NP  losartan (COZAAR) 50 MG tablet Take 0.5 tablets (25 mg total) by mouth daily. 07/09/16  Yes Filbert Schilder, MD  Melatonin 10 MG TABS Take 10 mg by mouth at bedtime.    Yes [provider]  metFORMIN (GLUCOPHAGE) 1000 MG tablet Take 500 mg by mouth 2 (two) times daily with a meal.    Yes [provider]  metoprolol tartrate (LOPRESSOR) 25 MG tablet Take 0.5 tablets (12.5 mg total) by mouth 2 (two) times daily. 07/10/16  Yes Filbert Schilder, MD  Multiple Vitamins-Minerals (DECUBI-VITE) CAPS Take 1 capsule by mouth daily.   Yes [provider]  omeprazole (PRILOSEC) 20 MG capsule Take 20 mg by mouth daily.    Yes [provider]  OVER THE COUNTER MEDICATION 1 drop by Other route as needed. "Lavender Oil" apply one drop behind each ear and on pillowcase as needed for agitation, anxiety, restlessness, insomnia   Yes [provider]  Oxycodone HCl 10 MG TABS Take one tablet by mouth every 6 hours as needed for severe pain 09/16/16  Yes  Carter, Monica, DO  polyethylene glycol (MIRALAX / GLYCOLAX) packet Take 17 g by mouth daily.   Yes [provider]  Propylene Glycol (SYSTANE BALANCE) 0.6 % SOLN Apply 1 drop to eye 2 (two) times daily.   Yes [provider]  Protein (PROCEL) POWD Take 1 scoop by mouth 2 (two) times daily.   Yes [provider]  QUEtiapine (SEROQUEL) 50 MG tablet Take 50 mg by mouth at bedtime.   Yes [provider]  senna (SENOKOT) 8.6 MG tablet Take 2 tablets by mouth 2 (two) times daily.    Yes [provider]  sertraline (ZOLOFT) 100 MG tablet Take 150 mg by mouth daily.   Yes [provider]  solifenacin (VESICARE) 5 MG tablet Take 5 mg by mouth daily.    Yes [provider]  sucralfate (CARAFATE) 1 g tablet Take 1 g by mouth 4 (four) times daily -  with meals and at bedtime.   Yes [provider]  tamsulosin (FLOMAX) 0.4 MG CAPS capsule Take 0.4 mg by mouth daily after supper.   Yes [provider]  ursodiol (ACTIGALL) 500 MG tablet Take 500 mg by mouth 2 (two) times daily.   Yes [provider]  warfarin (COUMADIN) 3 MG tablet Take 3 mg by mouth daily.   Yes [provider]  Zinc Oxide (TRIPLE PASTE) 12.8 % ointment Apply 1 application topically every 12 (twelve) hours.    Yes [provider]    Family History Family History  Problem Relation Age of Onset  . Family history unknown: Yes    Social History Social History  Substance Use Topics  . Smoking status: Never Smoker  . Smokeless tobacco: Never Used  . Alcohol use No     Allergies   Latex; Macrobid [nitrofurantoin monohyd macro]; and Morphine and related   Review of Systems Review of Systems  Unable to perform ROS: Other  Constitutional: Positive for fever. Negative for activity change and chills.  Respiratory: Negative for shortness of breath.   Cardiovascular: Negative for chest pain.  Gastrointestinal: Negative for  abdominal pain.  Genitourinary: Positive for penile pain, scrotal swelling and testicular pain. Negative for penile swelling.  Musculoskeletal: Negative for back pain.  Skin: Negative for rash.   Physical Exam Updated Vital Signs BP (!) 154/85   Pulse 91   Temp 100 F (37.8 C) (Rectal)   Resp 17   SpO2 94%   Physical Exam  Constitutional: He appears well-developed.  HENT:  Head: Normocephalic.  Eyes: Conjunctivae are normal.  Neck: Neck supple.  Cardiovascular: Normal rate and regular rhythm.   No murmur heard. Pulmonary/Chest: Effort normal.  Abdominal: He exhibits no distension. There is tenderness. There is no rebound and no guarding.  Diffuse tenderness to palpation throughout the entire abdomen. No distention, but the abdomen does not feel soft, more rigid in nature.   Genitourinary:  Genitourinary Comments: Chaperoned exam. The bilateral scrotum is exquisitely tender to palpation with erythema and mild swelling throughout. Candidiasis present over the posterior scrotum extending toward the rectum. No rectal involvement. The penis is non-tender to palpation.  Tender to palpation on rectal exam of the prostate. Soft brown stool is noted. No gross blood.   Bilateral inguinal lymphadenopathy.  Neurological: He is alert.  Skin: Skin is warm and dry.  Psychiatric: His behavior is normal.  Nursing note and vitals reviewed.    ED Treatments / Results  Labs (all labs ordered are listed, but only abnormal results are displayed) Labs Reviewed  COMPREHENSIVE METABOLIC PANEL - Abnormal; Notable for the following:       Result Value   Glucose, Bld 130 (*)    BUN 32 (*)    ALT 12 (*)    All other components within normal limits  CBC - Abnormal; Notable for the following:    WBC 11.7 (*)    RBC 4.09 (*)    Hemoglobin 11.3 (*)    HCT 34.9 (*)    All other components within normal limits  URINALYSIS, ROUTINE W REFLEX MICROSCOPIC - Abnormal; Notable for the following:     APPearance CLOUDY (*)    Hgb urine dipstick MODERATE (*)    Protein, ur 100 (*)    Leukocytes, UA MODERATE (*)    Bacteria, UA MANY (*)    Non Squamous Epithelial 0-5 (*)    All other components within normal limits  CULTURE, BLOOD (ROUTINE X 2)  CULTURE, BLOOD (ROUTINE X 2)  URINE  CULTURE  LIPASE, BLOOD  CBC  BASIC METABOLIC PANEL  I-STAT CG4 LACTIC ACID, ED  I-STAT CG4 LACTIC ACID, ED    EKG  EKG Interpretation None       Radiology US Scrotum  Result Date: 06/19/2017 CLINICAL DATA:  Initial evaluation for acute fever, testicular pain. EXAM: SCROTAL ULTRASOUND DOPPLER ULTRASOUND OF THE TESTICLES TECHNIQUE: Complete ultrasound examination of the testicles, epididymis, and other scrotal structures was performed. Color and spectral Doppler ultrasound were also utilized to evaluate blood flow to the testicles. COMPARISON:  None. FINDINGS: Right testicle Measurements: 4.0 x 2.6 x 2.3 cm. No mass or microlithiasis visualized. Left testicle Measurements: 4.0 x 2.1 x 2.5 cm. No mass or microlithiasis visualized. Increased asymmetric vascularity within the left testicle is compared to the right. Right epididymis:  Normal in size and appearance. Left epididymis:  Normal in size and appearance. Hydrocele:  None visualized. Varicocele:  None visualized. Pulsed Doppler interrogation of both testes demonstrates normal low resistance arterial and venous waveforms bilaterally. IMPRESSION: 1. Increased vascularity within the left testicle as compared to the right, suspicious for possible early orchitis given history of pain and fever. 2. Otherwise unremarkable scrotal ultrasound. Electronically Signed   By: Rise Mu M.D.   On: 06/19/2017 23:21   Ct Abdomen Pelvis W Contrast  Result Date: 06/19/2017 CLINICAL DATA:  Lower abdominal pain and distention. Testicular pain. EXAM: CT ABDOMEN AND PELVIS WITH CONTRAST TECHNIQUE: Multidetector CT imaging of the abdomen and pelvis was performed using  the standard protocol following bolus administration of intravenous contrast. CONTRAST:  100 mL ISOVUE-300 IOPAMIDOL (ISOVUE-300) INJECTION 61% COMPARISON:  CT chest 06/30/2016 FINDINGS: Lower chest: The lung bases are clear. Hepatobiliary: Scattered subcentimeter low-attenuation lesions demonstrated throughout the liver. These are too small to characterize. Metastasis needs to be excluded. Cholelithiasis with multiple stones in the gallbladder. Gallbladder is mildly distended without wall thickening or infiltration. No bile duct dilatation. Pancreas: Pancreas is mostly atrophic. No acute process is indicated. Spleen: Normal in size without focal abnormality. Adrenals/Urinary Tract: No adrenal gland nodules. Bilateral renal cysts. Two stones in the mid to lower portion of the right kidney. No hydronephrosis or hydroureter. Bladder wall is diffusely thickened. This could be due to hypertrophy from outlet obstruction or cystitis. No bladder filling defects. Stomach/Bowel: Stomach and small bowel are decompressed. Colon is diffusely stool-filled without apparent wall thickening or inflammatory change. Appendix is not identified. Vascular/Lymphatic: Scattered aortic calcifications. No aortic aneurysm or dissection. No significant lymphadenopathy. Reproductive: Prostate gland is enlarged, measuring about 4.2 cm diameter. Prostate calcifications are present. Other: No free air or free fluid in the abdomen. Abdominal wall musculature appears intact. Musculoskeletal: Degenerative changes throughout the lumbar spine with bridging anterior osteophytes. No vertebral compression deformities. Postoperative change with internal fixation of the right proximal femur. Degenerative changes in both hips. IMPRESSION: 1. Multiple subcentimeter low-attenuation lesions demonstrated throughout the liver. These are too small to characterize but metastasis should be excluded. Consider follow-up MRI in the elective setting. 2. Cholelithiasis  with multiple stones in the gallbladder. No inflammatory changes appreciated. 3. Nonobstructing stones in the right kidney. Bilateral renal cysts. 4. Prostate enlargement. 5. Bladder wall thickening could be due to cystitis or outlet obstruction. 6. Aortic atherosclerosis. Electronically Signed   By: Burman Nieves M.D.   On: 06/19/2017 22:36   US Pelvic Doppler (torsion R/o Or Mass Arterial Flow)  Result Date: 06/19/2017 CLINICAL DATA:  Initial evaluation for acute fever, testicular pain. EXAM: SCROTAL ULTRASOUND DOPPLER ULTRASOUND OF THE TESTICLES TECHNIQUE:  Complete ultrasound examination of the testicles, epididymis, and other scrotal structures was performed. Color and spectral Doppler ultrasound were also utilized to evaluate blood flow to the testicles. COMPARISON:  None. FINDINGS: Right testicle Measurements: 4.0 x 2.6 x 2.3 cm. No mass or microlithiasis visualized. Left testicle Measurements: 4.0 x 2.1 x 2.5 cm. No mass or microlithiasis visualized. Increased asymmetric vascularity within the left testicle is compared to the right. Right epididymis:  Normal in size and appearance. Left epididymis:  Normal in size and appearance. Hydrocele:  None visualized. Varicocele:  None visualized. Pulsed Doppler interrogation of both testes demonstrates normal low resistance arterial and venous waveforms bilaterally. IMPRESSION: 1. Increased vascularity within the left testicle as compared to the right, suspicious for possible early orchitis given history of pain and fever. 2. Otherwise unremarkable scrotal ultrasound. Electronically Signed   By: Rise Mu M.D.   On: 06/19/2017 23:21    Procedures Procedures (including critical care time)  Medications Ordered in ED Medications  nystatin (MYCOSTATIN/NYSTOP) topical powder (not administered)  insulin aspart (novoLOG) injection 0-9 Units (not administered)  meropenem (MERREM) 1 g in sodium chloride 0.9 % 100 mL IVPB (not administered)    pantoprazole (PROTONIX) EC tablet 40 mg (not administered)  atorvastatin (LIPITOR) tablet 10 mg (not administered)  losartan (COZAAR) tablet 25 mg (not administered)  multivitamin with minerals tablet 1 tablet (not administered)  polyethylene glycol (MIRALAX / GLYCOLAX) packet 17 g (not administered)  darifenacin (ENABLEX) 24 hr tablet 7.5 mg (not administered)  ferrous sulfate tablet 325 mg (not administered)  sertraline (ZOLOFT) tablet 150 mg (not administered)  LORazepam (ATIVAN) tablet 0.5 mg (0.5 mg Oral Given 06/20/17 0141)  QUEtiapine (SEROQUEL) tablet 50 mg (not administered)  tamsulosin (FLOMAX) capsule 0.4 mg (not administered)  levETIRAcetam (KEPPRA) tablet 250 mg (not administered)  senna (SENOKOT) tablet 17.2 mg (not administered)  metoprolol tartrate (LOPRESSOR) tablet 12.5 mg (not administered)  feeding supplement (PRO-STAT SUGAR FREE 64) liquid 30 mL (not administered)  ursodiol (ACTIGALL) capsule 600 mg (not administered)  polyvinyl alcohol (LIQUIFILM TEARS) 1.4 % ophthalmic solution 1 drop (not administered)  finasteride (PROSCAR) tablet 5 mg (not administered)  acetaminophen (TYLENOL) tablet 650 mg (not administered)  oxyCODONE (Oxy IR/ROXICODONE) immediate release tablet 10 mg (not administered)  baclofen (LIORESAL) tablet 15 mg (not administered)  sucralfate (CARAFATE) tablet 1 g (not administered)  ondansetron (ZOFRAN) tablet 4 mg (not administered)    Or  ondansetron (ZOFRAN) injection 4 mg (not administered)  enoxaparin (LOVENOX) injection 40 mg (not administered)  0.9 %  sodium chloride infusion ( Intravenous New Bag/Given 06/20/17 0142)  bisacodyl (DULCOLAX) EC tablet 5 mg (not administered)  iopamidol (ISOVUE-300) 61 % injection (100 mLs  Contrast Given 06/19/17 2206)     Initial Impression / Assessment and Plan / ED Course  I have reviewed the triage vital signs and the nursing notes.  Pertinent labs & imaging results that were available during my care of  the patient were reviewed by me and considered in my medical decision making (see chart for details).     71 year old male with a h/o of UTI with ESBL E. presenting to the emergency department with fever, abdominal pain, and testicular pain and erythema. Rectal temp 100. WBC 11.7 CT abdomen pelvis does demonstrate a moderate stool burden, but is not concerning for intra-abdominal infection or obstruction. Testicular ultrasound concerning for early orchitis. The patient's prostate is tender to palpation and exam. Nystatin ordered for candida infection on the posterior scrotum and perirectal area. UA  concerning for infection. Urine culture pending. Discussed and evaluated the patient with Dr. Rush Landmark, attending physician. Given the patient's history of ESBL E. Coli infection, consulted the hospitalist and spoke with Dr. Julian Reil who will admit the patient for continued workup and evaluation of cystitis, prostatitis, and orchitis. The patient appears reasonably stabilized for admission considering the current resources, flow, and capabilities available in the ED at this time, and I doubt any other [facility] requiring further screening and/or treatment in the ED prior to admission.   Final Clinical Impressions(s) / ED Diagnoses   Final diagnoses:  Testicular pain  Cystitis  Acute prostatitis  Orchitis  Candida infection    New Prescriptions New Prescriptions   No medications on file     Barkley Boards, PA-C 06/20/17 0154    Tegeler, Canary Brim, MD 06/23/17 1158

## 2017-06-19 NOTE — ED Notes (Signed)
IV noted upon assessment. Noted to be infiltrated at this time. Removed intact. No bleeding. Family at bedside at this time.

## 2017-06-20 DIAGNOSIS — B379 Candidiasis, unspecified: Secondary | ICD-10-CM | POA: Diagnosis present

## 2017-06-20 DIAGNOSIS — I69920 Aphasia following unspecified cerebrovascular disease: Secondary | ICD-10-CM | POA: Diagnosis not present

## 2017-06-20 DIAGNOSIS — Z86718 Personal history of other venous thrombosis and embolism: Secondary | ICD-10-CM | POA: Diagnosis not present

## 2017-06-20 DIAGNOSIS — K5909 Other constipation: Secondary | ICD-10-CM | POA: Diagnosis present

## 2017-06-20 DIAGNOSIS — N452 Orchitis: Secondary | ICD-10-CM | POA: Diagnosis present

## 2017-06-20 DIAGNOSIS — G9341 Metabolic encephalopathy: Secondary | ICD-10-CM | POA: Diagnosis present

## 2017-06-20 DIAGNOSIS — F329 Major depressive disorder, single episode, unspecified: Secondary | ICD-10-CM | POA: Diagnosis present

## 2017-06-20 DIAGNOSIS — Z7901 Long term (current) use of anticoagulants: Secondary | ICD-10-CM | POA: Diagnosis not present

## 2017-06-20 DIAGNOSIS — I69351 Hemiplegia and hemiparesis following cerebral infarction affecting right dominant side: Secondary | ICD-10-CM | POA: Diagnosis not present

## 2017-06-20 DIAGNOSIS — I129 Hypertensive chronic kidney disease with stage 1 through stage 4 chronic kidney disease, or unspecified chronic kidney disease: Secondary | ICD-10-CM | POA: Diagnosis present

## 2017-06-20 DIAGNOSIS — N390 Urinary tract infection, site not specified: Secondary | ICD-10-CM | POA: Diagnosis present

## 2017-06-20 DIAGNOSIS — Z885 Allergy status to narcotic agent status: Secondary | ICD-10-CM | POA: Diagnosis not present

## 2017-06-20 DIAGNOSIS — N410 Acute prostatitis: Secondary | ICD-10-CM | POA: Diagnosis present

## 2017-06-20 DIAGNOSIS — Z881 Allergy status to other antibiotic agents status: Secondary | ICD-10-CM | POA: Diagnosis not present

## 2017-06-20 DIAGNOSIS — Z91040 Latex allergy status: Secondary | ICD-10-CM | POA: Diagnosis not present

## 2017-06-20 DIAGNOSIS — E785 Hyperlipidemia, unspecified: Secondary | ICD-10-CM | POA: Diagnosis present

## 2017-06-20 DIAGNOSIS — E1122 Type 2 diabetes mellitus with diabetic chronic kidney disease: Secondary | ICD-10-CM | POA: Diagnosis present

## 2017-06-20 DIAGNOSIS — Z7984 Long term (current) use of oral hypoglycemic drugs: Secondary | ICD-10-CM | POA: Diagnosis not present

## 2017-06-20 DIAGNOSIS — E11319 Type 2 diabetes mellitus with unspecified diabetic retinopathy without macular edema: Secondary | ICD-10-CM | POA: Diagnosis present

## 2017-06-20 DIAGNOSIS — E118 Type 2 diabetes mellitus with unspecified complications: Secondary | ICD-10-CM | POA: Diagnosis not present

## 2017-06-20 DIAGNOSIS — Z87440 Personal history of urinary (tract) infections: Secondary | ICD-10-CM | POA: Diagnosis not present

## 2017-06-20 DIAGNOSIS — K219 Gastro-esophageal reflux disease without esophagitis: Secondary | ICD-10-CM | POA: Diagnosis present

## 2017-06-20 DIAGNOSIS — N183 Chronic kidney disease, stage 3 (moderate): Secondary | ICD-10-CM | POA: Diagnosis present

## 2017-06-20 DIAGNOSIS — N400 Enlarged prostate without lower urinary tract symptoms: Secondary | ICD-10-CM | POA: Diagnosis present

## 2017-06-20 DIAGNOSIS — I10 Essential (primary) hypertension: Secondary | ICD-10-CM | POA: Diagnosis not present

## 2017-06-20 LAB — CBC
HCT: 32.9 % — ABNORMAL LOW (ref 39.0–52.0)
HEMOGLOBIN: 10.3 g/dL — AB (ref 13.0–17.0)
MCH: 26.8 pg (ref 26.0–34.0)
MCHC: 31.3 g/dL (ref 30.0–36.0)
MCV: 85.5 fL (ref 78.0–100.0)
Platelets: 255 10*3/uL (ref 150–400)
RBC: 3.85 MIL/uL — AB (ref 4.22–5.81)
RDW: 14.6 % (ref 11.5–15.5)
WBC: 9 10*3/uL (ref 4.0–10.5)

## 2017-06-20 LAB — BASIC METABOLIC PANEL
Anion gap: 3 — ABNORMAL LOW (ref 5–15)
BUN: 31 mg/dL — AB (ref 6–20)
CHLORIDE: 106 mmol/L (ref 101–111)
CO2: 31 mmol/L (ref 22–32)
CREATININE: 1.08 mg/dL (ref 0.61–1.24)
Calcium: 8.6 mg/dL — ABNORMAL LOW (ref 8.9–10.3)
GFR calc Af Amer: >60 mL/min (ref >60)
GFR calc non Af Amer: >60 mL/min (ref >60)
GLUCOSE: 99 mg/dL (ref 65–99)
POTASSIUM: 3.2 mmol/L — AB (ref 3.5–5.1)
Sodium: 140 mmol/L (ref 135–145)

## 2017-06-20 LAB — BLOOD CULTURE ID PANEL (REFLEXED)
ACINETOBACTER BAUMANNII: NOT DETECTED
CANDIDA ALBICANS: NOT DETECTED
CANDIDA GLABRATA: NOT DETECTED
CANDIDA KRUSEI: NOT DETECTED
Candida parapsilosis: NOT DETECTED
Candida tropicalis: NOT DETECTED
Carbapenem resistance: NOT DETECTED
ENTEROBACTER CLOACAE COMPLEX: NOT DETECTED
ESCHERICHIA COLI: NOT DETECTED
Enterobacteriaceae species: NOT DETECTED
Enterococcus species: NOT DETECTED
HAEMOPHILUS INFLUENZAE: NOT DETECTED
KLEBSIELLA OXYTOCA: NOT DETECTED
Klebsiella pneumoniae: NOT DETECTED
Listeria monocytogenes: NOT DETECTED
METHICILLIN RESISTANCE: NOT DETECTED
Neisseria meningitidis: NOT DETECTED
PSEUDOMONAS AERUGINOSA: NOT DETECTED
Proteus species: NOT DETECTED
STREPTOCOCCUS PNEUMONIAE: NOT DETECTED
STREPTOCOCCUS PYOGENES: NOT DETECTED
Serratia marcescens: NOT DETECTED
Staphylococcus aureus (BCID): NOT DETECTED
Staphylococcus species: DETECTED — AB
Streptococcus agalactiae: NOT DETECTED
Streptococcus species: NOT DETECTED
Vancomycin resistance: NOT DETECTED

## 2017-06-20 LAB — PROTIME-INR
INR: 1.3
Prothrombin Time: 16 seconds — ABNORMAL HIGH (ref 11.4–15.2)

## 2017-06-20 LAB — GLUCOSE, CAPILLARY
GLUCOSE-CAPILLARY: 111 mg/dL — AB (ref 65–99)
GLUCOSE-CAPILLARY: 110 mg/dL — AB (ref 65–99)
Glucose-Capillary: 116 mg/dL — ABNORMAL HIGH (ref 65–99)
Glucose-Capillary: 118 mg/dL — ABNORMAL HIGH (ref 65–99)

## 2017-06-20 LAB — MRSA PCR SCREENING: MRSA BY PCR: NEGATIVE

## 2017-06-20 MED ORDER — FERROUS SULFATE 325 (65 FE) MG PO TABS
325.0000 mg | ORAL_TABLET | Freq: Every day | ORAL | Status: DC
  Administered 2017-06-20 – 2017-06-21 (×2): 325 mg via ORAL
  Filled 2017-06-20 (×2): qty 1

## 2017-06-20 MED ORDER — SERTRALINE HCL 50 MG PO TABS
150.0000 mg | ORAL_TABLET | Freq: Every day | ORAL | Status: DC
  Administered 2017-06-20 – 2017-06-21 (×2): 150 mg via ORAL
  Filled 2017-06-20 (×2): qty 1

## 2017-06-20 MED ORDER — ADULT MULTIVITAMIN W/MINERALS CH
1.0000 | ORAL_TABLET | Freq: Every day | ORAL | Status: DC
  Administered 2017-06-20 – 2017-06-21 (×2): 1 via ORAL
  Filled 2017-06-20 (×3): qty 1

## 2017-06-20 MED ORDER — INSULIN ASPART 100 UNIT/ML ~~LOC~~ SOLN: 0.0000 [IU] | SUBCUTANEOUS | Status: DC

## 2017-06-20 MED ORDER — ENOXAPARIN SODIUM 40 MG/0.4ML ~~LOC~~ SOLN
40.0000 mg | Freq: Every day | SUBCUTANEOUS | Status: DC
  Administered 2017-06-20: 40 mg via SUBCUTANEOUS
  Filled 2017-06-20: qty 0.4

## 2017-06-20 MED ORDER — URSODIOL 300 MG PO CAPS
600.0000 mg | ORAL_CAPSULE | Freq: Two times a day (BID) | ORAL | Status: DC
  Administered 2017-06-20 – 2017-06-21 (×3): 600 mg via ORAL
  Filled 2017-06-20 (×5): qty 2

## 2017-06-20 MED ORDER — INSULIN ASPART 100 UNIT/ML ~~LOC~~ SOLN: 0.0000 [IU] | Freq: Three times a day (TID) | SUBCUTANEOUS | Status: DC

## 2017-06-20 MED ORDER — CEFAZOLIN SODIUM-DEXTROSE 1-4 GM/50ML-% IV SOLN
1.0000 g | Freq: Once | INTRAVENOUS | Status: DC
Start: 2017-06-20 — End: 2017-06-20

## 2017-06-20 MED ORDER — SODIUM CHLORIDE 0.9 % IV SOLN
1.0000 g | Freq: Three times a day (TID) | INTRAVENOUS | Status: DC
  Administered 2017-06-20 – 2017-06-21 (×6): 1 g via INTRAVENOUS
  Filled 2017-06-20 (×6): qty 1

## 2017-06-20 MED ORDER — LEVETIRACETAM 250 MG PO TABS
250.0000 mg | ORAL_TABLET | Freq: Two times a day (BID) | ORAL | Status: DC
  Administered 2017-06-20 – 2017-06-21 (×3): 250 mg via ORAL
  Filled 2017-06-20 (×5): qty 1

## 2017-06-20 MED ORDER — SODIUM CHLORIDE 0.9 % IV SOLN
INTRAVENOUS | Status: DC
  Administered 2017-06-20 – 2017-06-21 (×3): via INTRAVENOUS

## 2017-06-20 MED ORDER — LORAZEPAM 0.5 MG PO TABS
0.5000 mg | ORAL_TABLET | Freq: Every day | ORAL | Status: DC
  Administered 2017-06-20 (×2): 0.5 mg via ORAL
  Filled 2017-06-20 (×2): qty 1

## 2017-06-20 MED ORDER — POLYVINYL ALCOHOL 1.4 % OP SOLN
1.0000 [drp] | Freq: Two times a day (BID) | OPHTHALMIC | Status: DC
  Administered 2017-06-20 – 2017-06-21 (×3): 1 [drp] via OPHTHALMIC
  Filled 2017-06-20: qty 15

## 2017-06-20 MED ORDER — SENNA 8.6 MG PO TABS
2.0000 | ORAL_TABLET | Freq: Two times a day (BID) | ORAL | Status: DC
  Administered 2017-06-20 – 2017-06-21 (×3): 17.2 mg via ORAL
  Filled 2017-06-20 (×4): qty 2

## 2017-06-20 MED ORDER — ATORVASTATIN CALCIUM 10 MG PO TABS
10.0000 mg | ORAL_TABLET | Freq: Every day | ORAL | Status: DC
  Administered 2017-06-20: 10 mg via ORAL
  Filled 2017-06-20: qty 1

## 2017-06-20 MED ORDER — PROCEL PO POWD: 1.0000 | Freq: Two times a day (BID) | ORAL | Status: DC

## 2017-06-20 MED ORDER — DARIFENACIN HYDROBROMIDE ER 7.5 MG PO TB24
7.5000 mg | ORAL_TABLET | Freq: Every day | ORAL | Status: DC
  Administered 2017-06-20 – 2017-06-21 (×2): 7.5 mg via ORAL
  Filled 2017-06-20 (×2): qty 1

## 2017-06-20 MED ORDER — POLYETHYLENE GLYCOL 3350 17 G PO PACK
17.0000 g | PACK | Freq: Every day | ORAL | Status: DC
  Administered 2017-06-20 – 2017-06-21 (×2): 17 g via ORAL
  Filled 2017-06-20 (×2): qty 1

## 2017-06-20 MED ORDER — PANTOPRAZOLE SODIUM 40 MG PO TBEC
40.0000 mg | DELAYED_RELEASE_TABLET | Freq: Every day | ORAL | Status: DC
  Administered 2017-06-20 – 2017-06-21 (×2): 40 mg via ORAL
  Filled 2017-06-20 (×2): qty 1

## 2017-06-20 MED ORDER — BACLOFEN 10 MG PO TABS
15.0000 mg | ORAL_TABLET | Freq: Three times a day (TID) | ORAL | Status: DC
  Administered 2017-06-20 – 2017-06-21 (×5): 15 mg via ORAL
  Filled 2017-06-20 (×3): qty 2
  Filled 2017-06-20: qty 1
  Filled 2017-06-20 (×2): qty 2
  Filled 2017-06-20: qty 1

## 2017-06-20 MED ORDER — QUETIAPINE FUMARATE 50 MG PO TABS
50.0000 mg | ORAL_TABLET | Freq: Every day | ORAL | Status: DC
  Administered 2017-06-20: 50 mg via ORAL
  Filled 2017-06-20 (×2): qty 1

## 2017-06-20 MED ORDER — LOSARTAN POTASSIUM 25 MG PO TABS
25.0000 mg | ORAL_TABLET | Freq: Every day | ORAL | Status: DC
  Administered 2017-06-20 – 2017-06-21 (×2): 25 mg via ORAL
  Filled 2017-06-20 (×2): qty 1

## 2017-06-20 MED ORDER — ONDANSETRON HCL 4 MG/2ML IJ SOLN: 4.0000 mg | Freq: Four times a day (QID) | INTRAMUSCULAR | Status: DC | PRN

## 2017-06-20 MED ORDER — ACETAMINOPHEN 325 MG PO TABS: 650.0000 mg | ORAL_TABLET | ORAL | Status: DC | PRN

## 2017-06-20 MED ORDER — MAGNESIUM CITRATE PO SOLN
1.0000 | Freq: Once | ORAL | Status: AC
  Administered 2017-06-20: 1 via ORAL
  Filled 2017-06-20: qty 296

## 2017-06-20 MED ORDER — ONDANSETRON HCL 4 MG PO TABS: 4.0000 mg | ORAL_TABLET | Freq: Four times a day (QID) | ORAL | Status: DC | PRN

## 2017-06-20 MED ORDER — OXYCODONE HCL 5 MG PO TABS: 10.0000 mg | ORAL_TABLET | Freq: Four times a day (QID) | ORAL | Status: DC | PRN

## 2017-06-20 MED ORDER — PRO-STAT SUGAR FREE PO LIQD
30.0000 mL | Freq: Two times a day (BID) | ORAL | Status: DC
  Administered 2017-06-20 (×2): 30 mL via ORAL
  Filled 2017-06-20 (×4): qty 30

## 2017-06-20 MED ORDER — INSULIN ASPART 100 UNIT/ML ~~LOC~~ SOLN
0.0000 [IU] | Freq: Three times a day (TID) | SUBCUTANEOUS | Status: DC
  Administered 2017-06-21: 1 [IU] via SUBCUTANEOUS

## 2017-06-20 MED ORDER — SODIUM CHLORIDE 0.9 % IV SOLN
1250.0000 mg | INTRAVENOUS | Status: DC
  Administered 2017-06-21: 1250 mg via INTRAVENOUS
  Filled 2017-06-20: qty 1250

## 2017-06-20 MED ORDER — BISACODYL 5 MG PO TBEC
5.0000 mg | DELAYED_RELEASE_TABLET | Freq: Every day | ORAL | Status: DC | PRN
  Filled 2017-06-20: qty 1

## 2017-06-20 MED ORDER — FINASTERIDE 5 MG PO TABS
5.0000 mg | ORAL_TABLET | Freq: Every evening | ORAL | Status: DC
  Administered 2017-06-20: 5 mg via ORAL
  Filled 2017-06-20: qty 1

## 2017-06-20 MED ORDER — VANCOMYCIN HCL 10 G IV SOLR
1500.0000 mg | Freq: Once | INTRAVENOUS | Status: AC
  Administered 2017-06-20: 1500 mg via INTRAVENOUS
  Filled 2017-06-20: qty 1500

## 2017-06-20 MED ORDER — METOPROLOL TARTRATE 12.5 MG HALF TABLET
12.5000 mg | ORAL_TABLET | Freq: Two times a day (BID) | ORAL | Status: DC
  Administered 2017-06-20 – 2017-06-21 (×3): 12.5 mg via ORAL
  Filled 2017-06-20 (×3): qty 1

## 2017-06-20 MED ORDER — TAMSULOSIN HCL 0.4 MG PO CAPS
0.4000 mg | ORAL_CAPSULE | Freq: Every day | ORAL | Status: DC
  Administered 2017-06-20: 0.4 mg via ORAL
  Filled 2017-06-20: qty 1

## 2017-06-20 MED ORDER — SUCRALFATE 1 G PO TABS
1.0000 g | ORAL_TABLET | Freq: Three times a day (TID) | ORAL | Status: DC
  Administered 2017-06-20 – 2017-06-21 (×6): 1 g via ORAL
  Filled 2017-06-20 (×6): qty 1

## 2017-06-20 MED ORDER — SULFAMETHOXAZOLE-TRIMETHOPRIM 800-160 MG PO TABS: 1.0000 | ORAL_TABLET | Freq: Once | ORAL | Status: DC

## 2017-06-20 NOTE — Clinical Social Work Note (Signed)
Clinical Social Work Assessment  Patient Details  Name: Gilbert Lewis MRN: [id number] Date of Birth: Feb 01, 1946  Date of referral:  06/20/17               Reason for consult:  Facility Placement                Permission sought to share information with:  Family Supports Permission granted to share information::     Name::     Magazine features editor::     Relationship::  Daughter  Contact Information:     Housing/Transportation Living arrangements for the past 2 months:  Skilled Building surveyor of Information:  Adult Children Patient Interpreter Needed:  None Criminal Activity/Legal Involvement Pertinent to Current Situation/Hospitalization:  No - Comment as needed Significant Relationships:  Adult Children Lives with:  Facility Resident Do you feel safe going back to the place where you live?    Need for family participation in patient care:     Care giving concerns:  Pt is only alert to self. CSW spoke with pt's daughter via telephone.   Social Worker assessment / plan:  CSW spoke with pt's daughter via telephone. Pt is a LTC resident at [facility]. Daughter confirmed plan is for pt to return to [facility] at d/c. Facility confirmed pt can return at d/c. CSW will follow for d/c, and will set up transport.  Employment status:  Retired Database administrator PT Recommendations:  Skilled Nursing Facility Information / Referral to community resources:  Skilled Nursing Facility  Patient/Family's Response to care:  Pt's daughter verbalized understanding of CSW role and expressed appreciation for support. Pt's daughter denies any concern regarding pt care at this time.   Patient/Family's Understanding of and Emotional Response to Diagnosis, Current Treatment, and Prognosis:  Pt's daughter understanding and realistic regarding pt's physical limitations. Pt's daughter understands the need for SNF placement for pt at d/c. Pt's daughter agreeable to SNF placement at d/c, at this  time.  Pt's daughter denies any concern regarding pt's treatment plan at this time. CSW will continue to provide support and facilitate d/c needs.   Emotional Assessment Appearance:  Appears stated age Attitude/Demeanor/Rapport:  Unable to Assess Affect (typically observed):  Unable to Assess Orientation:  Oriented to Self Alcohol / Substance use:  Not Applicable Psych involvement (Current and /or in the community):  No (Comment)  Discharge Needs  Concerns to be addressed:  No discharge needs identified Readmission within the last 30 days:  No Current discharge risk:  Dependent with Mobility, None Barriers to Discharge:  No Barriers Identified   Amaree Leeper A Daliya Parchment, LCSW 06/20/2017, 2:44 PM

## 2017-06-20 NOTE — NC FL2 (Signed)
[place] MEDICAID FL2 LEVEL OF CARE SCREENING TOOL     IDENTIFICATION  Patient Name: Gilbert Lewis Birthdate: September 30, 1946 Sex: male Admission Date (Current Location): 06/19/2017  [facility] and [place] Number:  Producer, television/film/video and Address:  [facility] [place], [address], [place], [place] [address]      Provider Number: [id number]  Attending Physician Name and Address:  Tyrone Nine, MD  Relative Name and Phone Number:       Current Level of Care: Hospital Recommended Level of Care: Skilled Nursing Facility Prior Approval Number:    Date Approved/Denied:   PASRR Number:  [id number]   Discharge Plan: SNF    Current Diagnoses: Patient Active Problem List   Diagnosis Date Noted  . Acute lower UTI 06/20/2017  . Orchitis 06/20/2017  . Acute metabolic encephalopathy 06/20/2017  . Subacromial impingement, left 01/19/2017  . Constipation, slow transit 01/18/2017  . Hyperlipidemia LDL goal <70 01/18/2017  . Essential hypertension 01/18/2017  . History of pressure ulcer 12/09/2016  . Moderate protein-calorie malnutrition (HCC) 12/09/2016  . Chronic GERD 12/09/2016  . Spastic hemiplegia affecting dominant side (HCC) 11/22/2016  . Elevated BUN   . Normochromic normocytic anemia   . Subtherapeutic international normalized ratio (INR)   . TIA (transient ischemic attack) 07/07/2016  . Diabetes mellitus with complication (HCC) 07/07/2016  . Dysarthria 07/07/2016  . Seizures (HCC) 07/07/2016  . Stroke-like symptoms   . Pressure injury of skin 07/01/2016  . DVT (deep venous thrombosis) (HCC) 06/30/2016  . Slurred speech   . Benign hypertensive heart disease without heart failure 06/13/2016  . 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 Type 2 diabetes mellitus with stage 3 chronic kidney disease, without  long-term current use of insulin (HCC) 06/13/2016  . Major depression, chronic 06/13/2016  . BPH (benign prostatic hyperplasia) 06/13/2016  . OAB (overactive bladder) 06/13/2016  . Anemia, iron deficiency 06/13/2016  . 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 Insomnia 06/13/2016  . Chronic gastric ulcer 06/13/2016  . CKD (chronic kidney disease) stage 3, GFR 30-59 ml/min 06/13/2016  . Chronic constipation 06/13/2016  . Chronic hyponatremia 06/13/2016  . Chronic pain syndrome 06/13/2016  . Calculus of gallbladder and bile duct with obstruction without cholecystitis 06/13/2016  . History of DVT (deep vein thrombosis) 06/13/2016    Orientation RESPIRATION BLADDER Height & Weight     Self  Normal Continent Weight: 161 lb (73 kg) Height:   (193 cm)  BEHAVIORAL SYMPTOMS/MOOD NEUROLOGICAL BOWEL NUTRITION STATUS      Continent  (Please see d/c summary)  AMBULATORY STATUS COMMUNICATION OF NEEDS Skin   Limited Assist Verbally Normal                       Personal Care Assistance Level of Assistance  Feeding, Dressing, Bathing Bathing Assistance: Limited assistance Feeding assistance: Independent Dressing Assistance: Limited assistance     Functional Limitations Info  Sight, Hearing, Speech Sight Info: Adequate Hearing Info: Adequate Speech Info: Adequate    SPECIAL CARE FACTORS FREQUENCY  PT (By licensed PT), OT (By licensed OT)     PT Frequency: 3x OT Frequency: 3x            Contractures Contractures Info: Not present    Additional Factors Info  Code Status, Allergies Code Status Info: Full Code Allergies Info: Latex, Macrobid Nitrofurantoin Monohyd  Macro, Morphine And Related           Current Medications (06/20/2017):  This is the current hospital active medication list Current Facility-Administered Medications  Medication Dose Route Frequency Provider Last Rate Last Dose  . 0.9 %  sodium chloride infusion   Intravenous Continuous Hillary Bow, DO 100 mL/hr at 06/20/17 0142    . acetaminophen (TYLENOL) tablet 650 mg  650 mg Oral Q4H PRN Hillary Bow, DO      . atorvastatin (LIPITOR) tablet 10 mg  10 mg Oral q1800 Hillary Bow, DO      . baclofen (LIORESAL) tablet 15 mg  15 mg Oral TID Hillary Bow, DO   15 mg at 06/20/17 0437  . bisacodyl (DULCOLAX) EC tablet 5 mg  5 mg Oral Daily PRN Hillary Bow, DO      . darifenacin (ENABLEX) 24 hr tablet 7.5 mg  7.5 mg Oral Daily Lyda Perone M, DO   7.5 mg at 06/20/17 1226  . enoxaparin (LOVENOX) injection 40 mg  40 mg Subcutaneous Daily Julian Reil, Jared M, DO      . feeding supplement (PRO-STAT SUGAR FREE 64) liquid 30 mL  30 mL Oral BID Lyda Perone M, DO   30 mL at 06/20/17 0958  . ferrous sulfate tablet 325 mg  325 mg Oral Q breakfast Hillary Bow, DO   325 mg at 06/20/17 1226  . finasteride (PROSCAR) tablet 5 mg  5 mg Oral QPM Lyda Perone M, DO      . insulin aspart (novoLOG) injection 0-9 Units  0-9 Units Subcutaneous TID WC Hillary Bow, DO      . levETIRAcetam (KEPPRA) tablet 250 mg  250 mg Oral BID Lyda Perone M, DO   250 mg at 06/20/17 0957  . LORazepam (ATIVAN) tablet 0.5 mg  0.5 mg Oral QHS Lyda Perone M, DO   0.5 mg at 06/20/17 0141  . losartan (COZAAR) tablet 25 mg  25 mg Oral Daily Lyda Perone M, DO   25 mg at 06/20/17 1225  . meropenem (MERREM) 1 g in sodium chloride 0.9 % 100 mL IVPB  1 g Intravenous Q8H Tegeler, Canary Brim, MD   Stopped at 06/20/17 0720  . metoprolol tartrate (LOPRESSOR) tablet 12.5 mg  12.5 mg Oral BID Lyda Perone M, DO   12.5 mg at 06/20/17 0957  . multivitamin with minerals tablet 1 tablet  1 tablet Oral Daily Lyda Perone M, DO   1 tablet at 06/20/17 0957  . ondansetron (ZOFRAN) tablet 4 mg  4 mg Oral Q6H PRN Hillary Bow, DO       Or  . ondansetron [facility]) injection 4 mg  4 mg Intravenous Q6H PRN Hillary Bow, DO      . oxyCODONE (Oxy IR/ROXICODONE) immediate release tablet 10 mg  10 mg Oral Q6H PRN  Hillary Bow, DO      . pantoprazole (PROTONIX) EC tablet 40 mg  40 mg Oral Daily Lyda Perone M, DO   40 mg at 06/20/17 0957  . polyethylene glycol (MIRALAX / GLYCOLAX) packet 17 g  17 g Oral Daily Hillary Bow, DO   17 g at 06/20/17 0957  . polyvinyl alcohol (LIQUIFILM TEARS) 1.4 % ophthalmic solution 1 drop  1 drop Both Eyes BID Hillary Bow, DO   1 drop at 06/20/17 0957  . QUEtiapine (SEROQUEL) tablet 50 mg  50 mg Oral QHS Hillary Bow, DO      .  senna (SENOKOT) tablet 17.2 mg  2 tablet Oral BID Lyda Perone M, DO   17.2 mg at 06/20/17 0957  . sertraline (ZOLOFT) tablet 150 mg  150 mg Oral Daily Lyda Perone M, DO   150 mg at 06/20/17 1226  . sucralfate (CARAFATE) tablet 1 g  1 g Oral TID WC & HS Lyda Perone M, DO   1 g at 06/20/17 1226  . tamsulosin (FLOMAX) capsule 0.4 mg  0.4 mg Oral QPC supper Hillary Bow, DO      . ursodiol (ACTIGALL) capsule 600 mg  600 mg Oral BID Lyda Perone M, DO   600 mg at 06/20/17 0957  . [START ON 06/21/2017] vancomycin (VANCOCIN) 1,250 mg in sodium chloride 0.9 % 250 mL IVPB  1,250 mg Intravenous Q24H Zadie Rhine, MD         Discharge Medications: Please see discharge summary for a list of discharge medications.  Relevant Imaging Results:  Relevant Lab Results:   Additional Information SSN: [id number]  Contact precautions  Altin Sease A Makaiya Geerdes, LCSW

## 2017-06-20 NOTE — Care Management Obs Status (Signed)
MEDICARE OBSERVATION STATUS NOTIFICATION   Patient Details  Name: Gilbert Lewis MRN: [id number] Date of Birth: 01-12-1946   Medicare Observation Status Notification Given:  Other (see comment)  Message for spouse Willett Lefeber [phone]. Awaiting call back.   Kingsley Plan, RN 06/20/2017, 10:01 AM

## 2017-06-20 NOTE — Progress Notes (Addendum)
PROGRESS NOTE  Subjective: Gilbert Lewis is a 71 y.o. male with a history of hereditary spastic hemiplegia, CVA, NIDDM, and HTN, who presented to the ED from [facility] for waxing/waning AMS and constant, worsening testicular pain and redness. Reported Tmax was 100F. Leukocytosis was noted. Scrotal U/S was consistent with early orchitis. CT abdomen showed no abscess. UA was suggestive of UTI. The patient was placed in observation early this morning by Dr. Julian Reil.  Objective: BP 124/69 (BP Location: Left Arm)   Pulse 84   Temp 99.3 F (37.4 C) (Oral)   Resp 18   Ht  (1.93 m)   Wt 73 kg (161 lb)   SpO2 98%   BMI 19.60 kg/m   Gen: Pleasant 71yo male in no distress Pulm: Clear and nonlabored on nonlabored  CV: RRR, no murmur, no JVD, no edema GI: Soft, NT, ND, +BS. No stool ball palpated.   Neuro: Alert, remains poorly responsive to questions which is not reported baseline. LUE with diminished strength, LLE plegia.  GU: Penis normal with condom catheter in place. Scrotum intensely erythematous with extreme tenderness to palpation of testes, no masses. Cremasteric reflex intact. No fluctuance or wound noted.   Assessment & Plan: UTI, left orchitis: Asymmetric vascularity of left teste compared to right. Epididymi normal. Doppler demonstrated normal arterial/venous waveforms. Stability of exam without blisters/bullae, crepitus, fluctuance and minimal systemic symptoms not consistent with Fournier's gangrene though with diabetes he is at risk of this.  - Continue empiric vancomycin/meropenem (h/o ESBL E. coli UTI). If exam continues to improve over next 24 hours, may DC back to [facility] 9/19. - Monitor blood and urine cultures.  BPH: continue home treatments. Bladder appeared distended on CT. Making good UOP.  - Measure I/O.   Constipation: +flatus, but LBM 9/11. Moderate stool burden on CT without obstruction. Predisposes to UTI. - Bowel regimen per orders, will add magnesium  citrate x1  History of DVT while on xarelto:  - Continue coumadin  Other plan per orders and H&P from this morning  Hazeline Junker, MD [facility] Pager [phone] 06/20/2017, 2:57 PM

## 2017-06-20 NOTE — H&P (Signed)
History and Physical    Gilbert Lewis ZOX:[id number] DOB: Dec 08, 1945 DOA: 06/19/2017  PCP: Oneal Grout, MD  Patient coming from: [facility]  I have personally briefly reviewed patient's old medical records in [facility] Health Link  Chief Complaint: Testicle pain, AMS.  HPI: Gilbert Lewis is a 71 y.o. male with medical history significant of stroke and hereditary spastic hemiplegia, DM, CKD, HTN.  Patient presents to the ED from [facility].  Patient apparently has waxing and waning AMS.  At baseline per staff at [facility] place and also per EDP he is talkative at baseline, but not as talkative today.   ED Course: Has UTI, orchitis.  UCx in 2012 with ESBL e.coli.   Review of Systems: Unable to perform due to AMS.  Past Medical History:  Diagnosis Date  . Cataract   . CKD (chronic kidney disease)   . Depression   . Diabetes mellitus without complication (HCC)   . Diabetic retinopathy (HCC)   . Hypertension   . Stroke [facility])     Past Surgical History:  Procedure Laterality Date  . APPENDECTOMY    . SKIN BIOPSY       reports that he has never smoked. He has never used smokeless tobacco. He reports that he does not drink alcohol or use drugs.  Allergies  Allergen Reactions  . Latex Other (See Comments)    Per MAR  . Macrobid [employer] Macro] Other (See Comments)    Per MAR  . Morphine And Related Other (See Comments)    Per MAR    Family History  Problem Relation Age of Onset  . Family history unknown: Yes     Prior to Admission medications   Medication Sig Start Date End Date Taking? Authorizing Provider  acetaminophen (TYLENOL) 325 MG tablet Take 650 mg by mouth every 4 (four) hours as needed for mild pain.   Yes [provider]  atorvastatin (LIPITOR) 10 MG tablet Take 1 tablet (10 mg total) by mouth daily at 6 PM. 07/09/16  Yes Filbert Schilder, MD  baclofen (LIORESAL) 10 MG tablet Take 15 mg by mouth 3 (three) times daily. 0600, 1400,  2200   Yes [provider]  Cholecalciferol 50000 units capsule Take 50,000 Units by mouth every 30 (thirty) days.   Yes [provider]  docusate sodium (COLACE) 100 MG capsule Take 100 mg by mouth 2 (two) times daily.   Yes [provider]  ferrous sulfate 325 (65 FE) MG tablet Take 325 mg by mouth daily with breakfast.   Yes [provider]  finasteride (PROSCAR) 5 MG tablet Take 5 mg by mouth every evening.    Yes [provider]  Lavender Oil OIL 1 drop by Does not apply route as needed. Apply one drop behind each ear and on pillowcase as needed for agitation, anxiety, restlessness, insomnia. Discontinue use if skin irritation occurs   Yes [provider]  levETIRAcetam (KEPPRA) 250 MG tablet Take 250 mg by mouth 2 (two) times daily.   Yes [provider]  LORazepam (ATIVAN) 0.5 MG tablet Take 1 tablet (0.5 mg total) by mouth at bedtime. 11/10/16  Yes Sharon Seller, NP  losartan (COZAAR) 50 MG tablet Take 0.5 tablets (25 mg total) by mouth daily. 07/09/16  Yes Filbert Schilder, MD  Melatonin 10 MG TABS Take 10 mg by mouth at bedtime.    Yes [provider]  metFORMIN (GLUCOPHAGE) 1000 MG tablet Take 500 mg by mouth 2 (  two) times daily with a meal.    Yes [provider]  metoprolol tartrate (LOPRESSOR) 25 MG tablet Take 0.5 tablets (12.5 mg total) by mouth 2 (two) times daily. 07/10/16  Yes Filbert Schilder, MD  Multiple Vitamins-Minerals (DECUBI-VITE) CAPS Take 1 capsule by mouth daily.   Yes [provider]  omeprazole (PRILOSEC) 20 MG capsule Take 20 mg by mouth daily.    Yes [provider]  OVER THE COUNTER MEDICATION 1 drop by Other route as needed. "Lavender Oil" apply one drop behind each ear and on pillowcase as needed for agitation, anxiety, restlessness, insomnia   Yes [provider]  Oxycodone HCl 10 MG TABS Take one tablet by mouth every 6 hours as needed for severe  pain 09/16/16  Yes Carter, Monica, DO  polyethylene glycol (MIRALAX / GLYCOLAX) packet Take 17 g by mouth daily.   Yes [provider]  Propylene Glycol (SYSTANE BALANCE) 0.6 % SOLN Apply 1 drop to eye 2 (two) times daily.   Yes [provider]  Protein (PROCEL) POWD Take 1 scoop by mouth 2 (two) times daily.   Yes [provider]  QUEtiapine (SEROQUEL) 50 MG tablet Take 50 mg by mouth at bedtime.   Yes [provider]  senna (SENOKOT) 8.6 MG tablet Take 2 tablets by mouth 2 (two) times daily.    Yes [provider]  sertraline (ZOLOFT) 100 MG tablet Take 150 mg by mouth daily.   Yes [provider]  solifenacin (VESICARE) 5 MG tablet Take 5 mg by mouth daily.    Yes [provider]  sucralfate (CARAFATE) 1 g tablet Take 1 g by mouth 4 (four) times daily -  with meals and at bedtime.   Yes [provider]  tamsulosin (FLOMAX) 0.4 MG CAPS capsule Take 0.4 mg by mouth daily after supper.   Yes [provider]  ursodiol (ACTIGALL) 500 MG tablet Take 500 mg by mouth 2 (two) times daily.   Yes [provider]  warfarin (COUMADIN) 3 MG tablet Take 3 mg by mouth daily.   Yes [provider]  Zinc Oxide (TRIPLE PASTE) 12.8 % ointment Apply 1 application topically every 12 (twelve) hours.    Yes [provider]    Physical Exam: Vitals:   06/19/17 2200 06/19/17 2230 06/19/17 2300 06/19/17 2330  BP:   (!) 159/87 (!) 164/75  Pulse: (!) 103 90 80 81  Resp: 16 (!) 23 (!) 34 (!) 31  Temp:      TempSrc:      SpO2: 98% 99% 97% 91%    Constitutional: NAD, calm, comfortable Eyes: PERRL, lids and conjunctivae normal ENMT: Mucous membranes are moist. Posterior pharynx clear of any exudate or lesions.Normal dentition.  Neck: normal, supple, no masses, no thyromegaly Respiratory: clear to auscultation bilaterally, no wheezing, no crackles. Normal respiratory effort. No accessory muscle use.    Cardiovascular: Regular rate and rhythm, no murmurs / rubs / gallops. No extremity edema. 2+ pedal pulses. No carotid bruits.  Abdomen: Mild diffuse tenderness Musculoskeletal: Pain of R shoulder with movement of R arm (baseline) Skin: Erythema and induration of scrotum Neurologic: Spastic hemiplegia of L side. Psychiatric: answers all questions with "yeah" even if not appropriate answer   Labs on Admission: I have personally reviewed following labs and imaging studies  CBC:  Recent Labs Lab 06/19/17 1846  WBC 11.7*  HGB 11.3*  HCT 34.9*  MCV 85.3  PLT 280   Basic Metabolic Panel:  Recent Labs Lab 06/19/17 1846  NA 143  K 3.5  CL 107  CO2 25  GLUCOSE 130*  BUN 32*  CREATININE 1.07  CALCIUM 9.2   GFR: CrCl cannot be calculated (Unknown ideal weight.). Liver Function Tests:  Recent Labs Lab 06/19/17 1846  AST 19  ALT 12*  ALKPHOS 74  BILITOT 0.6  PROT 6.9  ALBUMIN 3.6    Recent Labs Lab 06/19/17 1846  LIPASE 27   No results for input(s): AMMONIA in the last 168 hours. Coagulation Profile: No results for input(s): INR, PROTIME in the last 168 hours. Cardiac Enzymes: No results for input(s): CKTOTAL, CKMB, CKMBINDEX, TROPONINI in the last 168 hours. BNP (last 3 results) No results for input(s): PROBNP in the last 8760 hours. HbA1C: No results for input(s): HGBA1C in the last 72 hours. CBG: No results for input(s): GLUCAP in the last 168 hours. Lipid Profile: No results for input(s): CHOL, HDL, LDLCALC, TRIG, CHOLHDL, LDLDIRECT in the last 72 hours. Thyroid Function Tests: No results for input(s): TSH, T4TOTAL, FREET4, T3FREE, THYROIDAB in the last 72 hours. Anemia Panel: No results for input(s): VITAMINB12, FOLATE, FERRITIN, TIBC, IRON, RETICCTPCT in the last 72 hours. Urine analysis:    Component Value Date/Time   COLORURINE YELLOW 06/19/2017 2236   APPEARANCEUR CLOUDY (A) 06/19/2017 2236   LABSPEC 1.010 06/19/2017 2236   PHURINE 5.0  06/19/2017 2236   GLUCOSEU NEGATIVE 06/19/2017 2236   HGBUR MODERATE (A) 06/19/2017 2236   BILIRUBINUR NEGATIVE 06/19/2017 2236   KETONESUR NEGATIVE 06/19/2017 2236   PROTEINUR 100 (A) 06/19/2017 2236   NITRITE NEGATIVE 06/19/2017 2236   LEUKOCYTESUR MODERATE (A) 06/19/2017 2236    Radiological Exams on Admission: US Scrotum  Result Date: 06/19/2017 CLINICAL DATA:  Initial evaluation for acute fever, testicular pain. EXAM: SCROTAL ULTRASOUND DOPPLER ULTRASOUND OF THE TESTICLES TECHNIQUE: Complete ultrasound examination of the testicles, epididymis, and other scrotal structures was performed. Color and spectral Doppler ultrasound were also utilized to evaluate blood flow to the testicles. COMPARISON:  None. FINDINGS: Right testicle Measurements: 4.0 x 2.6 x 2.3 cm. No mass or microlithiasis visualized. Left testicle Measurements: 4.0 x 2.1 x 2.5 cm. No mass or microlithiasis visualized. Increased asymmetric vascularity within the left testicle is compared to the right. Right epididymis:  Normal in size and appearance. Left epididymis:  Normal in size and appearance. Hydrocele:  None visualized. Varicocele:  None visualized. Pulsed Doppler interrogation of both testes demonstrates normal low resistance arterial and venous waveforms bilaterally. IMPRESSION: 1. Increased vascularity within the left testicle as compared to the right, suspicious for possible early orchitis given history of pain and fever. 2. Otherwise unremarkable scrotal ultrasound. Electronically Signed   By: Rise Mu M.D.   On: 06/19/2017 23:21   Ct Abdomen Pelvis W Contrast  Result Date: 06/19/2017 CLINICAL DATA:  Lower abdominal pain and distention. Testicular pain. EXAM: CT ABDOMEN AND PELVIS WITH CONTRAST TECHNIQUE: Multidetector CT imaging of the abdomen and pelvis was performed using the standard protocol following bolus administration of intravenous contrast. CONTRAST:  100 mL ISOVUE-300 IOPAMIDOL (ISOVUE-300)  INJECTION 61% COMPARISON:  CT chest 06/30/2016 FINDINGS: Lower chest: The lung bases are clear. Hepatobiliary: Scattered subcentimeter low-attenuation lesions demonstrated throughout the liver. These are too small to characterize. Metastasis needs to be excluded. Cholelithiasis with multiple stones in the gallbladder. Gallbladder is mildly distended without wall thickening or infiltration. No bile duct dilatation. Pancreas: Pancreas is mostly atrophic. No acute process is indicated. Spleen: Normal in size without focal abnormality. Adrenals/Urinary Tract:  No adrenal gland nodules. Bilateral renal cysts. Two stones in the mid to lower portion of the right kidney. No hydronephrosis or hydroureter. Bladder wall is diffusely thickened. This could be due to hypertrophy from outlet obstruction or cystitis. No bladder filling defects. Stomach/Bowel: Stomach and small bowel are decompressed. Colon is diffusely stool-filled without apparent wall thickening or inflammatory change. Appendix is not identified. Vascular/Lymphatic: Scattered aortic calcifications. No aortic aneurysm or dissection. No significant lymphadenopathy. Reproductive: Prostate gland is enlarged, measuring about 4.2 cm diameter. Prostate calcifications are present. Other: No free air or free fluid in the abdomen. Abdominal wall musculature appears intact. Musculoskeletal: Degenerative changes throughout the lumbar spine with bridging anterior osteophytes. No vertebral compression deformities. Postoperative change with internal fixation of the right proximal femur. Degenerative changes in both hips. IMPRESSION: 1. Multiple subcentimeter low-attenuation lesions demonstrated throughout the liver. These are too small to characterize but metastasis should be excluded. Consider follow-up MRI in the elective setting. 2. Cholelithiasis with multiple stones in the gallbladder. No inflammatory changes appreciated. 3. Nonobstructing stones in the right kidney.  Bilateral renal cysts. 4. Prostate enlargement. 5. Bladder wall thickening could be due to cystitis or outlet obstruction. 6. Aortic atherosclerosis. Electronically Signed   By: Burman Nieves M.D.   On: 06/19/2017 22:36   US Pelvic Doppler (torsion R/o Or Mass Arterial Flow)  Result Date: 06/19/2017 CLINICAL DATA:  Initial evaluation for acute fever, testicular pain. EXAM: SCROTAL ULTRASOUND DOPPLER ULTRASOUND OF THE TESTICLES TECHNIQUE: Complete ultrasound examination of the testicles, epididymis, and other scrotal structures was performed. Color and spectral Doppler ultrasound were also utilized to evaluate blood flow to the testicles. COMPARISON:  None. FINDINGS: Right testicle Measurements: 4.0 x 2.6 x 2.3 cm. No mass or microlithiasis visualized. Left testicle Measurements: 4.0 x 2.1 x 2.5 cm. No mass or microlithiasis visualized. Increased asymmetric vascularity within the left testicle is compared to the right. Right epididymis:  Normal in size and appearance. Left epididymis:  Normal in size and appearance. Hydrocele:  None visualized. Varicocele:  None visualized. Pulsed Doppler interrogation of both testes demonstrates normal low resistance arterial and venous waveforms bilaterally. IMPRESSION: 1. Increased vascularity within the left testicle as compared to the right, suspicious for possible early orchitis given history of pain and fever. 2. Otherwise unremarkable scrotal ultrasound. Electronically Signed   By: Rise Mu M.D.   On: 06/19/2017 23:21    EKG: Independently reviewed.  Assessment/Plan Principal Problem:   Acute lower UTI Active Problems:   Aphasia, late effect of cerebrovascular disease   Type 2 diabetes mellitus with stage 3 chronic kidney disease, without long-term current use of insulin (HCC)   Chronic constipation   Diabetes mellitus with complication (HCC)   Essential hypertension   Orchitis   Acute metabolic encephalopathy    1. UTI and orchitis  - 1. H/o ESBL 2. Concern by EDP that orchitis could turn into Fournier's which is what is prompting admission 3. Therefore will empirically treat with Merrem and vanc 4. UCx pending 5. IVF: NS at 100 cc/hr 2. Delirium - secondary to above 3. Aphasia - 1. apparently worse than baseline, secondary to delirium 2. At baseline apparently has a regular diet per SNF notes 4. Chronic constipation - 1. Last BM on 9/11 2. No obstruction on CT abd/pelvis 3. Continue home bowel regimen 4. Will add Dulcolax 5. If still no BM consider mag citrate 5. HTN - continue home meds 6. DM2 - 1. Hold metformin 2. Sensitive SSI AC  DVT prophylaxis: Lovenox  Code Status: Full Family Communication: No family in room Disposition Plan: SNF after admit Consults called: None Admission status: Place in obs   GARDNER, JARED M. DO [facility] Pager [phone]  If 7AM-7PM, please contact day team taking care of patient [email or web address] Password [email or web address]  06/20/2017, 1:13 AM

## 2017-06-20 NOTE — Progress Notes (Signed)
Pharmacy Antibiotic Note  Gilbert Lewis is a 72 y.o. male admitted on 06/19/2017 with orchitis.  Pharmacy has been consulted for Vancomycin  dosing.  Plan: Vancomycin 1500 mg IV now, then 1250 mg IV q24h     Temp (24hrs), Avg:99.6 F (37.6 C), Min:99.1 F (37.3 C), Max:100 F (37.8 C)   Recent Labs Lab 06/19/17 1846 06/19/17 1921 06/19/17 2347  WBC 11.7*  --   --   CREATININE 1.07  --   --   LATICACIDVEN  --  1.57 1.10    CrCl cannot be calculated (Unknown ideal weight.).    Allergies  Allergen Reactions  . Latex Other (See Comments)    Per MAR  . Macrobid [employer] Macro] Other (See Comments)    Per MAR  . Morphine And Related Other (See Comments)    Per MAR     Gilbert Lewis, Gilbert Lewis 06/20/2017 1:56 AM

## 2017-06-20 NOTE — Progress Notes (Signed)
Pharmacy Antibiotic Note  Gilbert Lewis is a 71 y.o. male admitted on 06/19/2017 with abdominal pain, possible UTI.  Pharmacy has been consulted for Meropenem dosing.  Plan: Meropenem 1 g IV q8h     Temp (24hrs), Avg:99.6 F (37.6 C), Min:99.1 F (37.3 C), Max:100 F (37.8 C)   Recent Labs Lab 06/19/17 1846 06/19/17 1921 06/19/17 2347  WBC 11.7*  --   --   CREATININE 1.07  --   --   LATICACIDVEN  --  1.57 1.10    CrCl cannot be calculated (Unknown ideal weight.).    Allergies  Allergen Reactions  . Latex Other (See Comments)    Per MAR  . Macrobid [employer] Macro] Other (See Comments)    Per MAR  . Morphine And Related Other (See Comments)    Per MAR    [name], [name] 06/20/2017 12:27 AM

## 2017-06-20 NOTE — ED Notes (Signed)
Report called to 6N. Report given to Harriett Sine, Charity fundraiser. Pt stable at this time. VSS.

## 2017-06-20 NOTE — Progress Notes (Signed)
Patient admitted from ED to room 6N01, alert and oriented to self only as baseline. VSS. Oriented to room and call bell. Denies c/o pain at this time.

## 2017-06-20 NOTE — Progress Notes (Signed)
  PHARMACY - PHYSICIAN COMMUNICATION CRITICAL VALUE ALERT - BLOOD CULTURE IDENTIFICATION (BCID)  Results for orders placed or performed during the hospital encounter of 06/19/17  Blood Culture ID Panel (Reflexed) (Collected: 06/19/2017  6:45 PM)  Result Value Ref Range   Enterococcus species NOT DETECTED NOT DETECTED   Vancomycin resistance NOT DETECTED NOT DETECTED   Listeria monocytogenes NOT DETECTED NOT DETECTED   Staphylococcus species DETECTED (A) NOT DETECTED   Staphylococcus aureus NOT DETECTED NOT DETECTED   Methicillin resistance NOT DETECTED NOT DETECTED   Streptococcus species NOT DETECTED NOT DETECTED   Streptococcus agalactiae NOT DETECTED NOT DETECTED   Streptococcus pneumoniae NOT DETECTED NOT DETECTED   Streptococcus pyogenes NOT DETECTED NOT DETECTED   Acinetobacter baumannii NOT DETECTED NOT DETECTED   Enterobacteriaceae species NOT DETECTED NOT DETECTED   Enterobacter cloacae complex NOT DETECTED NOT DETECTED   Escherichia coli NOT DETECTED NOT DETECTED   Klebsiella oxytoca NOT DETECTED NOT DETECTED   Klebsiella pneumoniae NOT DETECTED NOT DETECTED   Proteus species NOT DETECTED NOT DETECTED   Serratia marcescens NOT DETECTED NOT DETECTED   Carbapenem resistance NOT DETECTED NOT DETECTED   Haemophilus influenzae NOT DETECTED NOT DETECTED   Neisseria meningitidis NOT DETECTED NOT DETECTED   Pseudomonas aeruginosa NOT DETECTED NOT DETECTED   Candida albicans NOT DETECTED NOT DETECTED   Candida glabrata NOT DETECTED NOT DETECTED   Candida krusei NOT DETECTED NOT DETECTED   Candida parapsilosis NOT DETECTED NOT DETECTED   Candida tropicalis NOT DETECTED NOT DETECTED    Name of physician (or Provider) Contacted: Dr. Jarvis Newcomer  Changes to prescribed antibiotics required: None - likely a contaminant. The patient is on broad coverage with Vancomycin + Meropenem for empiric UTI/orchitis coverage with hx ESBL.   Rolley Sims 06/20/2017  5:11 PM

## 2017-06-20 NOTE — Clinical Social Work Placement (Signed)
   CLINICAL SOCIAL WORK PLACEMENT  NOTE  Date:  06/20/2017  Patient Details  Name: Gilbert Lewis MRN: [id number] Date of Birth: 08-03-1946  Clinical Social Work is seeking post-discharge placement for this patient at the Skilled  Nursing Facility level of care (*CSW will initial, date and re-position this form in  chart as items are completed):      Patient/family provided with [facility] Clinical Social Work Department's list of facilities offering this level of care within the geographic area requested by the patient (or if unable, by the patient's family).  Yes   Patient/family informed of their freedom to choose among providers that offer the needed level of care, that participate in Medicare, Medicaid or managed care program needed by the patient, have an available bed and are willing to accept the patient.      Patient/family informed of [facility]'s ownership interest in [facility] and [facility], as well as of the fact that they are under no obligation to receive care at these facilities.  PASRR submitted to EDS on       PASRR number received on 06/20/17     Existing PASRR number confirmed on       FL2 transmitted to all facilities in geographic area requested by pt/family on 06/20/17     FL2 transmitted to all facilities within larger geographic area on       Patient informed that his/her managed care company has contracts with or will negotiate with certain facilities, including the following:        Yes   Patient/family informed of bed offers received.  Patient chooses bed at [facility]     Physician recommends and patient chooses bed at      Patient to be transferred to [facility] on  .  Patient to be transferred to facility by [facility]     Patient family notified on   of transfer.  Name of family member notified:        PHYSICIAN Please prepare priority discharge summary, including medications, Please prepare prescriptions     Additional Comment:     _______________________________________________ Maree Krabbe, LCSW 06/20/2017, 2:47 PM

## 2017-06-21 DIAGNOSIS — B379 Candidiasis, unspecified: Secondary | ICD-10-CM

## 2017-06-21 DIAGNOSIS — N452 Orchitis: Principal | ICD-10-CM

## 2017-06-21 DIAGNOSIS — I10 Essential (primary) hypertension: Secondary | ICD-10-CM

## 2017-06-21 DIAGNOSIS — N183 Chronic kidney disease, stage 3 (moderate): Secondary | ICD-10-CM

## 2017-06-21 DIAGNOSIS — K5909 Other constipation: Secondary | ICD-10-CM

## 2017-06-21 DIAGNOSIS — E1122 Type 2 diabetes mellitus with diabetic chronic kidney disease: Secondary | ICD-10-CM

## 2017-06-21 DIAGNOSIS — E118 Type 2 diabetes mellitus with unspecified complications: Secondary | ICD-10-CM

## 2017-06-21 LAB — GLUCOSE, CAPILLARY
GLUCOSE-CAPILLARY: 81 mg/dL (ref 65–99)
GLUCOSE-CAPILLARY: 126 mg/dL — AB (ref 65–99)
Glucose-Capillary: 102 mg/dL — ABNORMAL HIGH (ref 65–99)

## 2017-06-21 LAB — PROTIME-INR
INR: 1.2
PROTHROMBIN TIME: 15.1 s (ref 11.4–15.2)

## 2017-06-21 LAB — URINE CULTURE

## 2017-06-21 MED ORDER — OXYCODONE HCL 10 MG PO TABS: ORAL_TABLET | ORAL | 0 refills | Status: DC

## 2017-06-21 MED ORDER — FLUCONAZOLE 100 MG PO TABS
150.0000 mg | ORAL_TABLET | Freq: Once | ORAL | Status: AC
  Administered 2017-06-21: 150 mg via ORAL
  Filled 2017-06-21: qty 2

## 2017-06-21 MED ORDER — DOXYCYCLINE HYCLATE 100 MG PO TABS: 100.0000 mg | ORAL_TABLET | Freq: Two times a day (BID) | ORAL | 0 refills | Status: AC

## 2017-06-21 MED ORDER — NYSTATIN 100000 UNIT/GM EX POWD: Freq: Four times a day (QID) | CUTANEOUS | 0 refills | Status: AC

## 2017-06-21 MED ORDER — CEPHALEXIN 500 MG PO CAPS
500.0000 mg | ORAL_CAPSULE | Freq: Four times a day (QID) | ORAL | Status: DC
  Administered 2017-06-21: 500 mg via ORAL
  Filled 2017-06-21: qty 1

## 2017-06-21 MED ORDER — DOXYCYCLINE HYCLATE 100 MG PO TABS
100.0000 mg | ORAL_TABLET | Freq: Two times a day (BID) | ORAL | Status: DC
  Administered 2017-06-21: 100 mg via ORAL
  Filled 2017-06-21: qty 1

## 2017-06-21 MED ORDER — CEPHALEXIN 500 MG PO CAPS: 500.0000 mg | ORAL_CAPSULE | Freq: Four times a day (QID) | ORAL | 0 refills | Status: AC

## 2017-06-21 MED ORDER — LORAZEPAM 0.5 MG PO TABS: 0.5000 mg | ORAL_TABLET | Freq: Every day | ORAL | 0 refills | Status: DC

## 2017-06-21 NOTE — Discharge Planning (Signed)
Patient arrived from 6N01 to 2W20 for transfer to [facility] . Settled in bed by [employer]. Patient alert and wife is here with patient .

## 2017-06-21 NOTE — Progress Notes (Signed)
Patient will discharge to [facility] Anticipated discharge date: 9/19 Family notified: left message for daughter Transportation by [facility]- called at 4:30pm but anticipate DC later this evening as ambulance service is behind  CSW signing off.  Burna Sis, LCSW Clinical Social Worker [phone]

## 2017-06-21 NOTE — Discharge Summary (Addendum)
Physician Discharge Summary  Zailen Albarran ZOX:[id number] DOB: [birth year] DOA: 06/19/2017  PCP: Oneal Grout, MD  Admit date: 06/19/2017 Discharge date: 06/21/2017  Admitted From: NH Disposition: NH   Recommendations for Outpatient Follow-up:  1. Follow up with PCP in 1-2 weeks 2. Please obtain BMP/CBC in one week 3. Continue keflex and doxycycline for 8 more days. Apply nystatin powder to perineum/scrotum QID and keep area dry.   Home Health: N/A Equipment/Devices: None new Discharge Condition: Stable CODE STATUS: Full Diet recommendation: Carb-modified  Brief/Interim Summary: Gilbert Lewis is a 71 y.o. male with a history of hereditary spastic hemiplegia, CVA with resultant aphasia, NIDDM, and HTN, who presented to the ED from [facility] for waxing/waning AMS and constant, worsening testicular pain and redness. Reported Tmax was 100F. Leukocytosis was noted. Scrotal U/S was consistent with early orchitis. CT abdomen showed no abscess. UA was suggestive of UTI. The patient was placed in observation with broad spectrum antibiotics started due to history of ESBL infection. Pain and redness improved slowly and WBC normalized. Vital signs have remained stable and erythema is improving. Urine culture was nonclonal.   Discharge Diagnoses:  Principal Problem:   Acute lower UTI Active Problems:   Aphasia, late effect of cerebrovascular disease   Type 2 diabetes mellitus with stage 3 chronic kidney disease, without long-term current use of insulin (HCC)   Chronic constipation   Diabetes mellitus with complication (HCC)   Essential hypertension   Orchitis   Acute metabolic encephalopathy   UTI (urinary tract infection)  UTI, left orchitis: Asymmetric vascularity of left teste compared to right. Epididymi normal. Doppler demonstrated normal arterial/venous waveforms. Stability of exam without blisters/bullae, crepitus, fluctuance and minimal (now resolved) systemic symptoms not consistent  with Fournier's gangrene though with diabetes he is at risk of this.  - Continue empiric antibiotics for UTI (nonclonal UCx) and scrotal cellulitis. Will Rx doxycycline and keflex.  - Continue topical nystatin  - Monitor blood cultures (1 of 2 with CoNS thought to be contaminant)  BPH:  - Continue home treatments. Making good UOP.   Constipation: +flatus. Moderate stool burden on CT without obstruction. Predisposes to UTI. - Continued senna BID and miralax scheduled, magnesium citrate, and soap suds enema. Abd exam continues to be benign.  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   atorvastatin 10 MG tablet Commonly known as:  LIPITOR Take 1 tablet (10 mg total) by mouth daily at 6 PM.   baclofen 10 MG tablet Commonly known as:  LIORESAL Take 15 mg by mouth 3 (three) times daily. 0600, 1400, 2200   cephALEXin 500 MG capsule Commonly known as:  KEFLEX Take 1 capsule (500 mg total) by mouth every 6 (six) hours.   Cholecalciferol 50000 units capsule Take 50,000 Units by mouth every 30 (thirty) days.   DECUBI-VITE Caps Take 1 capsule by mouth daily.   docusate sodium 100 MG capsule Commonly known as:  COLACE Take 100 mg by mouth 2 (two) times  daily.   doxycycline 100 MG tablet Commonly known as:  VIBRA-TABS Take 1 tablet (100 mg total) by mouth every 12 (twelve) hours.   ferrous sulfate 325 (65 FE) MG tablet Take 325 mg by mouth daily with breakfast.   finasteride 5 MG tablet Commonly known as:  PROSCAR Take 5 mg by mouth every evening.    Lavender Oil Oil 1 drop by Does not apply route as needed. Apply one drop behind each ear and on pillowcase as needed for agitation, anxiety, restlessness, insomnia. Discontinue use if skin irritation occurs   levETIRAcetam 250 MG tablet Commonly known as:  KEPPRA Take 250 mg by mouth 2 (two) times daily.   LORazepam 0.5 MG tablet Commonly known as:  ATIVAN Take 1 tablet (0.5 mg total) by mouth at bedtime.   losartan 50 MG tablet Commonly known as:  COZAAR Take 0.5 tablets (25 mg total) by mouth daily.   Melatonin 10 MG Tabs Take 10 mg by mouth at bedtime.   metFORMIN 1000 MG tablet Commonly known as:  GLUCOPHAGE Take 500 mg by mouth 2 (two) times daily with a meal.   metoprolol tartrate 25 MG tablet Commonly known as:  LOPRESSOR Take 0.5 tablets (12.5 mg total) by mouth 2 (two) times daily.   nystatin powder Commonly known as:  MYCOSTATIN/NYSTOP Apply topically 4 (four) times daily. to scrotum/perineum.   omeprazole 20 MG capsule Commonly known as:  PRILOSEC Take 20 mg by mouth daily.   OVER THE COUNTER MEDICATION 1 drop by Other route as needed. "Lavender Oil" apply one drop behind each ear and on pillowcase as needed for agitation, anxiety, restlessness, insomnia   Oxycodone HCl 10 MG Tabs Take one tablet by mouth every 6 hours as needed for severe pain   polyethylene glycol packet Commonly known as:  MIRALAX / GLYCOLAX Take 17 g by mouth daily.   PROCEL Powd Take 1 scoop by mouth 2 (two) times daily.   QUEtiapine 50 MG tablet Commonly known as:  SEROQUEL Take 50 mg by mouth at bedtime.   senna 8.6 MG tablet Commonly known as:  SENOKOT Take 2 tablets by mouth 2 (two) times daily.   sertraline 100 MG tablet Commonly known as:  ZOLOFT Take 150 mg by mouth daily.   solifenacin 5 MG tablet Commonly known as:  VESICARE Take 5 mg by mouth daily.   sucralfate 1 g tablet Commonly known as:  CARAFATE Take 1 g by mouth 4 (four) times daily -  with meals and  at bedtime.   SYSTANE BALANCE 0.6 % Soln Generic drug:  Propylene Glycol Apply 1 drop to eye 2 (two) times daily.   tamsulosin 0.4 MG Caps capsule Commonly known as:  FLOMAX Take 0.4 mg by mouth daily after supper.   ursodiol 500 MG tablet Commonly known as:  ACTIGALL Take 500 mg by mouth 2 (two) times daily.   warfarin 3 MG tablet Commonly known as:  COUMADIN Take 3 mg by mouth daily.   Zinc Oxide 12.8 % ointment Commonly known as:  TRIPLE PASTE Apply 1 application topically every 12 (twelve) hours.            Discharge Care Instructions        Start     Ordered   06/21/17 0000  cephALEXin (KEFLEX) 500 MG capsule  Every 6 hours     06/21/17 1344   06/21/17 0000  doxycycline (VIBRA-TABS) 100 MG tablet  Every 12 hours     06/21/17 1344  06/21/17 0000  LORazepam (ATIVAN) 0.5 MG tablet  Daily at bedtime     06/21/17 1344   06/21/17 0000  Oxycodone HCl 10 MG TABS     06/21/17 1344   06/21/17 0000  nystatin (MYCOSTATIN/NYSTOP) powder  4 times daily     06/21/17 1348     Contact information for after-discharge care    Destination    [facility] .   Specialty:  Skilled Nursing Facility Contact information: [address] [place] [address] [phone]             Allergies  Allergen Reactions  . Latex Other (See Comments)    Per MAR  . Macrobid [employer] Macro] Other (See Comments)    Per MAR  . Morphine And Related Other (See Comments)    Per MAR    Consultations:  None  Procedures/Studies: US Scrotum  Result Date: 06/19/2017 CLINICAL DATA:  Initial evaluation for acute fever, testicular pain. EXAM: SCROTAL ULTRASOUND DOPPLER ULTRASOUND OF THE TESTICLES TECHNIQUE: Complete ultrasound examination of the testicles, epididymis, and other scrotal structures was performed. Color and spectral Doppler ultrasound were also utilized to evaluate blood flow to the testicles. COMPARISON:  None. FINDINGS: Right testicle  Measurements: 4.0 x 2.6 x 2.3 cm. No mass or microlithiasis visualized. Left testicle Measurements: 4.0 x 2.1 x 2.5 cm. No mass or microlithiasis visualized. Increased asymmetric vascularity within the left testicle is compared to the right. Right epididymis:  Normal in size and appearance. Left epididymis:  Normal in size and appearance. Hydrocele:  None visualized. Varicocele:  None visualized. Pulsed Doppler interrogation of both testes demonstrates normal low resistance arterial and venous waveforms bilaterally. IMPRESSION: 1. Increased vascularity within the left testicle as compared to the right, suspicious for possible early orchitis given history of pain and fever. 2. Otherwise unremarkable scrotal ultrasound. Electronically Signed   By: Rise Mu M.D.   On: 06/19/2017 23:21   Ct Abdomen Pelvis W Contrast  Result Date: 06/19/2017 CLINICAL DATA:  Lower abdominal pain and distention. Testicular pain. EXAM: CT ABDOMEN AND PELVIS WITH CONTRAST TECHNIQUE: Multidetector CT imaging of the abdomen and pelvis was performed using the standard protocol following bolus administration of intravenous contrast. CONTRAST:  100 mL ISOVUE-300 IOPAMIDOL (ISOVUE-300) INJECTION 61% COMPARISON:  CT chest 06/30/2016 FINDINGS: Lower chest: The lung bases are clear. Hepatobiliary: Scattered subcentimeter low-attenuation lesions demonstrated throughout the liver. These are too small to characterize. Metastasis needs to be excluded. Cholelithiasis with multiple stones in the gallbladder. Gallbladder is mildly distended without wall thickening or infiltration. No bile duct dilatation. Pancreas: Pancreas is mostly atrophic. No acute process is indicated. Spleen: Normal in size without focal abnormality. Adrenals/Urinary Tract: No adrenal gland nodules. Bilateral renal cysts. Two stones in the mid to lower portion of the right kidney. No hydronephrosis or hydroureter. Bladder wall is diffusely thickened. This could be due  to hypertrophy from outlet obstruction or cystitis. No bladder filling defects. Stomach/Bowel: Stomach and small bowel are decompressed. Colon is diffusely stool-filled without apparent wall thickening or inflammatory change. Appendix is not identified. Vascular/Lymphatic: Scattered aortic calcifications. No aortic aneurysm or dissection. No significant lymphadenopathy. Reproductive: Prostate gland is enlarged, measuring about 4.2 cm diameter. Prostate calcifications are present. Other: No free air or free fluid in the abdomen. Abdominal wall musculature appears intact. Musculoskeletal: Degenerative changes throughout the lumbar spine with bridging anterior osteophytes. No vertebral compression deformities. Postoperative change with internal fixation of the right proximal femur. Degenerative changes in both hips. IMPRESSION: 1.  Multiple subcentimeter low-attenuation lesions demonstrated throughout the liver. These are too small to characterize but metastasis should be excluded. Consider follow-up MRI in the elective setting. 2. Cholelithiasis with multiple stones in the gallbladder. No inflammatory changes appreciated. 3. Nonobstructing stones in the right kidney. Bilateral renal cysts. 4. Prostate enlargement. 5. Bladder wall thickening could be due to cystitis or outlet obstruction. 6. Aortic atherosclerosis. Electronically Signed   By: Burman Nieves M.D.   On: 06/19/2017 22:36   US Pelvic Doppler (torsion R/o Or Mass Arterial Flow)  Result Date: 06/19/2017 CLINICAL DATA:  Initial evaluation for acute fever, testicular pain. EXAM: SCROTAL ULTRASOUND DOPPLER ULTRASOUND OF THE TESTICLES TECHNIQUE: Complete ultrasound examination of the testicles, epididymis, and other scrotal structures was performed. Color and spectral Doppler ultrasound were also utilized to evaluate blood flow to the testicles. COMPARISON:  None. FINDINGS: Right testicle Measurements: 4.0 x 2.6 x 2.3 cm. No mass or microlithiasis  visualized. Left testicle Measurements: 4.0 x 2.1 x 2.5 cm. No mass or microlithiasis visualized. Increased asymmetric vascularity within the left testicle is compared to the right. Right epididymis:  Normal in size and appearance. Left epididymis:  Normal in size and appearance. Hydrocele:  None visualized. Varicocele:  None visualized. Pulsed Doppler interrogation of both testes demonstrates normal low resistance arterial and venous waveforms bilaterally. IMPRESSION: 1. Increased vascularity within the left testicle as compared to the right, suspicious for possible early orchitis given history of pain and fever. 2. Otherwise unremarkable scrotal ultrasound. Electronically Signed   By: Rise Mu M.D.   On: 06/19/2017 23:21   Subjective: Tired, didn't sleep well last night. Reports pain is improved. No fevers or chills. Normal UOP.  Discharge Exam: BP 138/65 (BP Location: Left Wrist)   Pulse (!) 55   Temp 97.9 F (36.6 C) (Axillary)   Resp 17   Ht  (1.93 m)   Wt 73 kg (161 lb)   SpO2 97%   BMI 19.60 kg/m   General: Pt is drowsy but rousable, answering questions appropriately in no distress Cardiovascular: RRR, S1/S2 +, no rubs, no gallops Respiratory: CTA bilaterally, no wheezing, no rhonchi Abdominal: Soft, NT, ND, bowel sounds + Skin: Penis normal with condom catheter in place. Scrotum diffusely erythematous improved from yesterday. There are no palpable masses, fluctuance, or wound or discharge noted. Cremasteric reflex remains intact.   Labs: BNP (last 3 results)  Recent Labs  06/30/16 1616  BNP 57.3   Basic Metabolic Panel:  Recent Labs Lab 06/19/17 1846 06/20/17 0416  NA 143 140  K 3.5 3.2*  CL 107 106  CO2 25 31  GLUCOSE 130* 99  BUN 32* 31*  CREATININE 1.07 1.08  CALCIUM 9.2 8.6*   Liver Function Tests:  Recent Labs Lab 06/19/17 1846  AST 19  ALT 12*  ALKPHOS 74  BILITOT 0.6  PROT 6.9  ALBUMIN 3.6    Recent Labs Lab 06/19/17 1846   LIPASE 27   CBC:  Recent Labs Lab 06/19/17 1846 06/20/17 0416  WBC 11.7* 9.0  HGB 11.3* 10.3*  HCT 34.9* 32.9*  MCV 85.3 85.5  PLT 280 255   CBG:  Recent Labs Lab 06/20/17 1209 06/20/17 1921 06/20/17 2105 06/21/17 0749 06/21/17 1202  GLUCAP 116* 110* 118* 81 126*   Urinalysis    Component Value Date/Time   COLORURINE YELLOW 06/19/2017 2236   APPEARANCEUR CLOUDY (A) 06/19/2017 2236   LABSPEC 1.010 06/19/2017 2236   PHURINE 5.0 06/19/2017 2236   GLUCOSEU NEGATIVE 06/19/2017 2236  HGBUR MODERATE (A) 06/19/2017 2236   BILIRUBINUR NEGATIVE 06/19/2017 2236   KETONESUR NEGATIVE 06/19/2017 2236   PROTEINUR 100 (A) 06/19/2017 2236   NITRITE NEGATIVE 06/19/2017 2236   LEUKOCYTESUR MODERATE (A) 06/19/2017 2236    Microbiology Recent Results (from the past 240 hour(s))  Blood culture (routine x 2)     Status: None (Preliminary result)   Collection Time: 06/19/17  6:45 PM  Result Value Ref Range Status   Specimen Description BLOOD LEFT HAND  Final   Special Requests   Final    BOTTLES DRAWN AEROBIC AND ANAEROBIC Blood Culture adequate volume   Culture  Setup Time   Final    GRAM POSITIVE COCCI IN CLUSTERS ANAEROBIC BOTTLE ONLY CRITICAL RESULT CALLED TO, READ BACK BY AND VERIFIED WITH: Veronda Prude.D. 17:10 06/20/17  ([email or web address])    Culture GRAM POSITIVE COCCI  Final   Report Status PENDING  Incomplete  Blood Culture ID Panel (Reflexed)     Status: Abnormal   Collection Time: 06/19/17  6:45 PM  Result Value Ref Range Status   Enterococcus species NOT DETECTED NOT DETECTED Final   Vancomycin resistance NOT DETECTED NOT DETECTED Final   Listeria monocytogenes NOT DETECTED NOT DETECTED Final   Staphylococcus species DETECTED (A) NOT DETECTED Final    Comment: Methicillin (oxacillin) susceptible coagulase negative staphylococcus. Possible blood culture contaminant (unless isolated from more than one blood culture draw or clinical case suggests pathogenicity). No  antibiotic treatment is indicated for blood  culture contaminants. CRITICAL RESULT CALLED TO, READ BACK BY AND VERIFIED WITH: Veronda Prude.D. 17:10 06/20/17 ([email or web address])    Staphylococcus aureus NOT DETECTED NOT DETECTED Final   Methicillin resistance NOT DETECTED NOT DETECTED Final   Streptococcus species NOT DETECTED NOT DETECTED Final   Streptococcus agalactiae NOT DETECTED NOT DETECTED Final   Streptococcus pneumoniae NOT DETECTED NOT DETECTED Final   Streptococcus pyogenes NOT DETECTED NOT DETECTED Final   Acinetobacter baumannii NOT DETECTED NOT DETECTED Final   Enterobacteriaceae species NOT DETECTED NOT DETECTED Final   Enterobacter cloacae complex NOT DETECTED NOT DETECTED Final   Escherichia coli NOT DETECTED NOT DETECTED Final   Klebsiella oxytoca NOT DETECTED NOT DETECTED Final   Klebsiella pneumoniae NOT DETECTED NOT DETECTED Final   Proteus species NOT DETECTED NOT DETECTED Final   Serratia marcescens NOT DETECTED NOT DETECTED Final   Carbapenem resistance NOT DETECTED NOT DETECTED Final   Haemophilus influenzae NOT DETECTED NOT DETECTED Final   Neisseria meningitidis NOT DETECTED NOT DETECTED Final   Pseudomonas aeruginosa NOT DETECTED NOT DETECTED Final   Candida albicans NOT DETECTED NOT DETECTED Final   Candida glabrata NOT DETECTED NOT DETECTED Final   Candida krusei NOT DETECTED NOT DETECTED Final   Candida parapsilosis NOT DETECTED NOT DETECTED Final   Candida tropicalis NOT DETECTED NOT DETECTED Final  Blood culture (routine x 2)     Status: None (Preliminary result)   Collection Time: 06/19/17  7:00 PM  Result Value Ref Range Status   Specimen Description BLOOD LEFT HAND  Final   Special Requests   Final    BOTTLES DRAWN AEROBIC AND ANAEROBIC Blood Culture adequate volume   Culture NO GROWTH < 24 HOURS  Final   Report Status PENDING  Incomplete  Urine culture     Status: Abnormal   Collection Time: 06/19/17 10:36 PM  Result Value Ref Range Status    Specimen Description URINE, RANDOM  Final   Special Requests ADDED 0352 06/20/17  Final   Culture MULTIPLE  SPECIES PRESENT, SUGGEST RECOLLECTION (A)  Final   Report Status 06/21/2017 FINAL  Final  MRSA PCR Screening     Status: None   Collection Time: 06/20/17  3:20 AM  Result Value Ref Range Status   MRSA by PCR NEGATIVE NEGATIVE Final    Comment:        The GeneXpert MRSA Assay (FDA approved for NASAL specimens only), is one component of a comprehensive MRSA colonization surveillance program. It is not intended to diagnose MRSA infection nor to guide or monitor treatment for MRSA infections.     Time coordinating discharge: Approximately 40 minutes  Hazeline Junker, MD  [facility] 06/21/2017, 1:48 PM Pager [phone]

## 2017-06-21 NOTE — Progress Notes (Signed)
Patient transported to [facility] for holding until transport can take him to [facility].  IV removed with no complications.  Patients wife present at time of move.

## 2017-06-22 LAB — CULTURE, BLOOD (ROUTINE X 2): SPECIAL REQUESTS: ADEQUATE

## 2017-06-24 LAB — CULTURE, BLOOD (ROUTINE X 2)
CULTURE: NO GROWTH
Special Requests: ADEQUATE

## 2017-07-15 ENCOUNTER — Encounter (HOSPITAL_COMMUNITY): Payer: Self-pay

## 2017-07-15 ENCOUNTER — Inpatient Hospital Stay (HOSPITAL_COMMUNITY)
Admission: EM | Admit: 2017-07-15 | Discharge: 2017-07-23 | DRG: 872 | Disposition: A | Discharge status: Skilled Nursing Facility | Payer: Medicare Other | Attending: Family Medicine | Admitting: Family Medicine

## 2017-07-15 ENCOUNTER — Emergency Department (HOSPITAL_COMMUNITY): Payer: Medicare Other

## 2017-07-15 DIAGNOSIS — D509 Iron deficiency anemia, unspecified: Secondary | ICD-10-CM

## 2017-07-15 DIAGNOSIS — R531 Weakness: Secondary | ICD-10-CM

## 2017-07-15 DIAGNOSIS — K921 Melena: Secondary | ICD-10-CM | POA: Diagnosis present

## 2017-07-15 DIAGNOSIS — E876 Hypokalemia: Secondary | ICD-10-CM | POA: Diagnosis present

## 2017-07-15 DIAGNOSIS — K5641 Fecal impaction: Secondary | ICD-10-CM | POA: Diagnosis present

## 2017-07-15 DIAGNOSIS — Z885 Allergy status to narcotic agent status: Secondary | ICD-10-CM

## 2017-07-15 DIAGNOSIS — I69359 Hemiplegia and hemiparesis following cerebral infarction affecting unspecified side: Secondary | ICD-10-CM

## 2017-07-15 DIAGNOSIS — N183 Chronic kidney disease, stage 3 unspecified: Secondary | ICD-10-CM | POA: Diagnosis present

## 2017-07-15 DIAGNOSIS — A419 Sepsis, unspecified organism: Secondary | ICD-10-CM | POA: Diagnosis not present

## 2017-07-15 DIAGNOSIS — G8929 Other chronic pain: Secondary | ICD-10-CM | POA: Diagnosis present

## 2017-07-15 DIAGNOSIS — R600 Localized edema: Secondary | ICD-10-CM | POA: Diagnosis present

## 2017-07-15 DIAGNOSIS — R339 Retention of urine, unspecified: Secondary | ICD-10-CM | POA: Diagnosis present

## 2017-07-15 DIAGNOSIS — Z7401 Bed confinement status: Secondary | ICD-10-CM

## 2017-07-15 DIAGNOSIS — G40909 Epilepsy, unspecified, not intractable, without status epilepticus: Secondary | ICD-10-CM | POA: Diagnosis present

## 2017-07-15 DIAGNOSIS — I129 Hypertensive chronic kidney disease with stage 1 through stage 4 chronic kidney disease, or unspecified chronic kidney disease: Secondary | ICD-10-CM | POA: Diagnosis present

## 2017-07-15 DIAGNOSIS — D508 Other iron deficiency anemias: Secondary | ICD-10-CM | POA: Diagnosis present

## 2017-07-15 DIAGNOSIS — N390 Urinary tract infection, site not specified: Secondary | ICD-10-CM | POA: Diagnosis present

## 2017-07-15 DIAGNOSIS — I10 Essential (primary) hypertension: Secondary | ICD-10-CM | POA: Diagnosis present

## 2017-07-15 DIAGNOSIS — B952 Enterococcus as the cause of diseases classified elsewhere: Secondary | ICD-10-CM | POA: Diagnosis present

## 2017-07-15 DIAGNOSIS — R109 Unspecified abdominal pain: Secondary | ICD-10-CM

## 2017-07-15 DIAGNOSIS — Z8619 Personal history of other infectious and parasitic diseases: Secondary | ICD-10-CM

## 2017-07-15 DIAGNOSIS — F329 Major depressive disorder, single episode, unspecified: Secondary | ICD-10-CM | POA: Diagnosis present

## 2017-07-15 DIAGNOSIS — E871 Hypo-osmolality and hyponatremia: Secondary | ICD-10-CM | POA: Diagnosis present

## 2017-07-15 DIAGNOSIS — E861 Hypovolemia: Secondary | ICD-10-CM | POA: Diagnosis present

## 2017-07-15 DIAGNOSIS — E11319 Type 2 diabetes mellitus with unspecified diabetic retinopathy without macular edema: Secondary | ICD-10-CM | POA: Diagnosis present

## 2017-07-15 DIAGNOSIS — I69391 Dysphagia following cerebral infarction: Secondary | ICD-10-CM

## 2017-07-15 DIAGNOSIS — E860 Dehydration: Secondary | ICD-10-CM | POA: Diagnosis present

## 2017-07-15 DIAGNOSIS — E118 Type 2 diabetes mellitus with unspecified complications: Secondary | ICD-10-CM | POA: Diagnosis present

## 2017-07-15 DIAGNOSIS — N179 Acute kidney failure, unspecified: Secondary | ICD-10-CM | POA: Diagnosis not present

## 2017-07-15 DIAGNOSIS — E878 Other disorders of electrolyte and fluid balance, not elsewhere classified: Secondary | ICD-10-CM | POA: Diagnosis present

## 2017-07-15 DIAGNOSIS — Z872 Personal history of diseases of the skin and subcutaneous tissue: Secondary | ICD-10-CM

## 2017-07-15 DIAGNOSIS — R338 Other retention of urine: Secondary | ICD-10-CM | POA: Diagnosis not present

## 2017-07-15 DIAGNOSIS — Z86718 Personal history of other venous thrombosis and embolism: Secondary | ICD-10-CM

## 2017-07-15 DIAGNOSIS — R627 Adult failure to thrive: Secondary | ICD-10-CM | POA: Diagnosis present

## 2017-07-15 DIAGNOSIS — Z7901 Long term (current) use of anticoagulants: Secondary | ICD-10-CM

## 2017-07-15 DIAGNOSIS — N136 Pyonephrosis: Secondary | ICD-10-CM | POA: Diagnosis present

## 2017-07-15 DIAGNOSIS — Z91040 Latex allergy status: Secondary | ICD-10-CM

## 2017-07-15 DIAGNOSIS — E440 Moderate protein-calorie malnutrition: Secondary | ICD-10-CM | POA: Diagnosis present

## 2017-07-15 DIAGNOSIS — Z881 Allergy status to other antibiotic agents status: Secondary | ICD-10-CM

## 2017-07-15 DIAGNOSIS — L8990 Pressure ulcer of unspecified site, unspecified stage: Secondary | ICD-10-CM | POA: Diagnosis present

## 2017-07-15 DIAGNOSIS — I69320 Aphasia following cerebral infarction: Secondary | ICD-10-CM

## 2017-07-15 DIAGNOSIS — E1122 Type 2 diabetes mellitus with diabetic chronic kidney disease: Secondary | ICD-10-CM | POA: Diagnosis present

## 2017-07-15 DIAGNOSIS — K922 Gastrointestinal hemorrhage, unspecified: Secondary | ICD-10-CM | POA: Diagnosis present

## 2017-07-15 DIAGNOSIS — Z7984 Long term (current) use of oral hypoglycemic drugs: Secondary | ICD-10-CM

## 2017-07-15 DIAGNOSIS — Z1621 Resistance to vancomycin: Secondary | ICD-10-CM | POA: Diagnosis present

## 2017-07-15 DIAGNOSIS — E870 Hyperosmolality and hypernatremia: Secondary | ICD-10-CM | POA: Diagnosis present

## 2017-07-15 DIAGNOSIS — Z79899 Other long term (current) drug therapy: Secondary | ICD-10-CM

## 2017-07-15 DIAGNOSIS — I69920 Aphasia following unspecified cerebrovascular disease: Secondary | ICD-10-CM

## 2017-07-15 DIAGNOSIS — M79606 Pain in leg, unspecified: Secondary | ICD-10-CM | POA: Diagnosis present

## 2017-07-15 LAB — CBC WITH DIFFERENTIAL/PLATELET
Basophils Absolute: 0 10*3/uL (ref 0.0–0.1)
Basophils Relative: 0 %
EOS ABS: 0 10*3/uL (ref 0.0–0.7)
EOS PCT: 0 %
HCT: 40.9 % (ref 39.0–52.0)
HEMOGLOBIN: 12.6 g/dL — AB (ref 13.0–17.0)
LYMPHS PCT: 19 %
Lymphs Abs: 2.7 10*3/uL (ref 0.7–4.0)
MCH: 27 pg (ref 26.0–34.0)
MCHC: 30.8 g/dL (ref 30.0–36.0)
MCV: 87.6 fL (ref 78.0–100.0)
MONO ABS: 1 10*3/uL (ref 0.1–1.0)
Monocytes Relative: 7 %
NEUTROS PCT: 74 %
Neutro Abs: 10.5 10*3/uL — ABNORMAL HIGH (ref 1.7–7.7)
PLATELETS: 289 10*3/uL (ref 150–400)
RBC: 4.67 MIL/uL (ref 4.22–5.81)
RDW: 15.4 % (ref 11.5–15.5)
WBC: 14.2 10*3/uL — AB (ref 4.0–10.5)

## 2017-07-15 LAB — URINALYSIS, ROUTINE W REFLEX MICROSCOPIC
BILIRUBIN URINE: NEGATIVE
Glucose, UA: NEGATIVE mg/dL
KETONES UR: NEGATIVE mg/dL
NITRITE: NEGATIVE
PROTEIN: 30 mg/dL — AB
SPECIFIC GRAVITY, URINE: 1.016 (ref 1.005–1.030)
pH: 6 (ref 5.0–8.0)

## 2017-07-15 LAB — BASIC METABOLIC PANEL
Anion gap: 8 (ref 5–15)
BUN: 97 mg/dL — AB (ref 6–20)
CHLORIDE: 129 mmol/L — AB (ref 101–111)
CO2: 21 mmol/L — ABNORMAL LOW (ref 22–32)
CREATININE: 2.26 mg/dL — AB (ref 0.61–1.24)
Calcium: 7.8 mg/dL — ABNORMAL LOW (ref 8.9–10.3)
GFR calc Af Amer: 32 mL/min — ABNORMAL LOW (ref >60)
GFR calc non Af Amer: 28 mL/min — ABNORMAL LOW (ref >60)
GLUCOSE: 184 mg/dL — AB (ref 65–99)
POTASSIUM: 3.1 mmol/L — AB (ref 3.5–5.1)
Sodium: 158 mmol/L — ABNORMAL HIGH (ref 135–145)

## 2017-07-15 LAB — COMPREHENSIVE METABOLIC PANEL
ALK PHOS: 67 U/L (ref 38–126)
ALT: 10 U/L — ABNORMAL LOW (ref 17–63)
ANION GAP: 8 (ref 5–15)
AST: 19 U/L (ref 15–41)
Albumin: 2.7 g/dL — ABNORMAL LOW (ref 3.5–5.0)
BILIRUBIN TOTAL: 1 mg/dL (ref 0.3–1.2)
BUN: 105 mg/dL — ABNORMAL HIGH (ref 6–20)
CALCIUM: 8.6 mg/dL — AB (ref 8.9–10.3)
CO2: 22 mmol/L (ref 22–32)
Chloride: 129 mmol/L — ABNORMAL HIGH (ref 101–111)
Creatinine, Ser: 2.44 mg/dL — ABNORMAL HIGH (ref 0.61–1.24)
GFR calc non Af Amer: 25 mL/min — ABNORMAL LOW (ref >60)
GFR, EST AFRICAN AMERICAN: 29 mL/min — AB (ref >60)
Glucose, Bld: 164 mg/dL — ABNORMAL HIGH (ref 65–99)
Potassium: 3.2 mmol/L — ABNORMAL LOW (ref 3.5–5.1)
Sodium: 159 mmol/L — ABNORMAL HIGH (ref 135–145)
Total Protein: 7.1 g/dL (ref 6.5–8.1)

## 2017-07-15 LAB — I-STAT CG4 LACTIC ACID, ED: Lactic Acid, Venous: 0.66 mmol/L (ref 0.5–1.9)

## 2017-07-15 LAB — POC OCCULT BLOOD, ED: Fecal Occult Bld: POSITIVE — AB

## 2017-07-15 LAB — PROTIME-INR
INR: 1.24
PROTHROMBIN TIME: 15.5 s — AB (ref 11.4–15.2)

## 2017-07-15 LAB — I-STAT TROPONIN, ED: TROPONIN I, POC: 0.01 ng/mL (ref 0.00–0.08)

## 2017-07-15 LAB — CK: Total CK: 78 U/L (ref 49–397)

## 2017-07-15 MED ORDER — PIPERACILLIN-TAZOBACTAM 3.375 G IVPB 30 MIN
3.3750 g | Freq: Once | INTRAVENOUS | Status: AC
  Administered 2017-07-15: 3.375 g via INTRAVENOUS
  Filled 2017-07-15: qty 50

## 2017-07-15 MED ORDER — VANCOMYCIN HCL IN DEXTROSE 1-5 GM/200ML-% IV SOLN
1000.0000 mg | Freq: Once | INTRAVENOUS | Status: AC
  Administered 2017-07-15: 1000 mg via INTRAVENOUS
  Filled 2017-07-15: qty 200

## 2017-07-15 MED ORDER — SODIUM CHLORIDE 0.9 % IV BOLUS (SEPSIS)
1000.0000 mL | Freq: Once | INTRAVENOUS | Status: AC
  Administered 2017-07-15: 1000 mL via INTRAVENOUS

## 2017-07-15 MED ORDER — PANTOPRAZOLE SODIUM 40 MG IV SOLR
40.0000 mg | Freq: Once | INTRAVENOUS | Status: AC
  Administered 2017-07-15: 40 mg via INTRAVENOUS
  Filled 2017-07-15: qty 40

## 2017-07-15 MED ORDER — SODIUM CHLORIDE 0.9 % IV BOLUS (SEPSIS)
2000.0000 mL | Freq: Once | INTRAVENOUS | Status: AC
  Administered 2017-07-15: 2000 mL via INTRAVENOUS

## 2017-07-15 NOTE — ED Triage Notes (Signed)
Pt from [facility], facility reports decreased P.O intake for the past week. Pt has hx of stroke, nonverbal. Pt also has a black coating on his tongue and around his teeth, unknown cause. HR 110, 96% on room air, BP 102 palpated. Pt alert.

## 2017-07-15 NOTE — ED Provider Notes (Signed)
[facility]-EMERGENCY DEPT Provider Note   CSN: [id number] Arrival date & time: 07/15/17  1830     History   Chief Complaint Chief Complaint  Patient presents with  . Code Sepsis  . Failure To Thrive    HPI Gilbert Lewis is a 71 y.o. male.  Patient has not been eating or drinking for a week now. He is very lethargic. He also has been having blood in his stool and his provider stopped his Coumadin Monday   The history is provided by the patient. No language interpreter was used.  Weakness  Primary symptoms include no focal weakness. This is a recurrent problem. The current episode started more than 1 week ago. The problem has not changed since onset.There was no focality noted. The maximum temperature recorded prior to his arrival was 100 to 100.9 F. The fever has been present for less than 1 day. Associated symptoms include altered mental status. Pertinent negatives include no shortness of breath, no chest pain and no headaches. There were no medications administered prior to arrival. Associated medical issues do not include trauma.    Past Medical History:  Diagnosis Date  . Cataract   . CKD (chronic kidney disease)   . Depression   . Diabetes mellitus without complication (HCC)   . Diabetic retinopathy (HCC)   . Hypertension   . Stroke [facility])     Patient Active Problem List   Diagnosis Date Noted  . Melena 07/15/2017  . Sepsis (HCC) 07/15/2017  . Hypernatremia 07/15/2017  . Hypokalemia 07/15/2017  . Dehydration 07/15/2017  . AKI (acute kidney injury) (HCC) 07/15/2017  . Acute lower UTI 06/20/2017  . Orchitis 06/20/2017  . Acute metabolic encephalopathy 06/20/2017  . UTI (urinary tract infection) 06/20/2017  . Subacromial impingement, left 01/19/2017  . Constipation, slow transit 01/18/2017  . Hyperlipidemia LDL goal <70 01/18/2017  . Essential hypertension 01/18/2017  . History of pressure ulcer 12/09/2016  . Moderate protein-calorie malnutrition (HCC) 12/09/2016  .  Chronic GERD 12/09/2016  . Spastic hemiplegia affecting dominant side (HCC) 11/22/2016  . Elevated BUN   . Normochromic normocytic anemia   . Subtherapeutic international normalized ratio (INR)   . TIA (transient ischemic attack) 07/07/2016  . Diabetes mellitus with complication (HCC) 07/07/2016  . Dysarthria 07/07/2016  . Seizures (HCC) 07/07/2016  . Stroke-like symptoms   . Pressure injury of skin 07/01/2016  . DVT (deep venous thrombosis) (HCC) 06/30/2016  . Slurred speech   . Benign hypertensive heart disease without heart failure 06/13/2016  . 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 Type 2 diabetes mellitus with stage 3 chronic kidney disease, without long-term current use of insulin (HCC) 06/13/2016  . Major depression, chronic 06/13/2016  . BPH (benign prostatic hyperplasia) 06/13/2016  . OAB (overactive bladder) 06/13/2016  . Anemia, iron deficiency 06/13/2016  . 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 Insomnia 06/13/2016  . Chronic gastric ulcer 06/13/2016  . CKD (chronic kidney disease) stage 3, GFR 30-59 ml/min (HCC) 06/13/2016  . Chronic constipation 06/13/2016  . Chronic hyponatremia 06/13/2016  . Chronic pain syndrome 06/13/2016  . Calculus of gallbladder and bile duct with obstruction without cholecystitis 06/13/2016  . History of DVT (deep vein thrombosis) 06/13/2016    Past Surgical History:  Procedure Laterality Date  . APPENDECTOMY    . SKIN BIOPSY         Home Medications  Prior to Admission medications   Medication Sig Start Date End Date Taking? Authorizing Provider  acetaminophen (TYLENOL) 325 MG tablet Take 650 mg by mouth every 4 (four) hours as needed for mild pain.    [provider]  atorvastatin (LIPITOR) 10 MG tablet Take 1 tablet (10 mg total) by mouth daily at 6 PM. 07/09/16    Filbert Schilder, MD  baclofen (LIORESAL) 10 MG tablet Take 15 mg by mouth 3 (three) times daily. 0600, 1400, 2200    [provider]  Cholecalciferol 50000 units capsule Take 50,000 Units by mouth every 30 (thirty) days.    [provider]  docusate sodium (COLACE) 100 MG capsule Take 100 mg by mouth 2 (two) times daily.    [provider]  ferrous sulfate 325 (65 FE) MG tablet Take 325 mg by mouth daily with breakfast.    [provider]  finasteride (PROSCAR) 5 MG tablet Take 5 mg by mouth every evening.     [provider]  Lavender Oil OIL 1 drop by Does not apply route as needed. Apply one drop behind each ear and on pillowcase as needed for agitation, anxiety, restlessness, insomnia. Discontinue use if skin irritation occurs    [provider]  levETIRAcetam (KEPPRA) 250 MG tablet Take 250 mg by mouth 2 (two) times daily.    [provider]  LORazepam (ATIVAN) 0.5 MG tablet Take 1 tablet (0.5 mg total) by mouth at bedtime. 06/21/17   Tyrone Nine, MD  losartan (COZAAR) 50 MG tablet Take 0.5 tablets (25 mg total) by mouth daily. 07/09/16   Filbert Schilder, MD  Melatonin 10 MG TABS Take 10 mg by mouth at bedtime.     [provider]  metFORMIN (GLUCOPHAGE) 1000 MG tablet Take 500 mg by mouth 2 (two) times daily with a meal.     [provider]  metoprolol tartrate (LOPRESSOR) 25 MG tablet Take 0.5 tablets (12.5 mg total) by mouth 2 (two) times daily. 07/10/16   Filbert Schilder, MD  Multiple Vitamins-Minerals (DECUBI-VITE) CAPS Take 1 capsule by mouth daily.    [provider]  nystatin (MYCOSTATIN/NYSTOP) powder Apply topically 4 (four) times daily. to scrotum/perineum. 06/21/17   Tyrone Nine, MD  omeprazole (PRILOSEC) 20 MG capsule Take 20 mg by mouth daily.     [provider]  OVER THE COUNTER MEDICATION 1 drop by Other route as needed. "Lavender Oil" apply one drop behind each  ear and on pillowcase as needed for agitation, anxiety, restlessness, insomnia    [provider]  Oxycodone HCl 10 MG TABS Take one tablet by mouth every 6 hours as needed for severe pain 06/21/17   Tyrone Nine, MD  polyethylene glycol (MIRALAX / GLYCOLAX) packet Take 17 g by mouth daily.    [provider]  Propylene Glycol (SYSTANE BALANCE) 0.6 % SOLN Apply 1 drop to eye 2 (two) times daily.    [provider]  Protein (PROCEL) POWD Take 1 scoop by mouth 2 (two) times daily.    [provider]  QUEtiapine (SEROQUEL) 50 MG tablet Take 50 mg by mouth at bedtime.    [provider]  senna (SENOKOT) 8.6 MG tablet Take 2 tablets by mouth 2 (two) times daily.     [provider]  sertraline (ZOLOFT) 100 MG tablet Take 150 mg by mouth daily.    [provider]  solifenacin (VESICARE) 5 MG tablet Take 5 mg by  mouth daily.     [provider]  sucralfate (CARAFATE) 1 g tablet Take 1 g by mouth 4 (four) times daily -  with meals and at bedtime.    [provider]  tamsulosin (FLOMAX) 0.4 MG CAPS capsule Take 0.4 mg by mouth daily after supper.    [provider]  ursodiol (ACTIGALL) 500 MG tablet Take 500 mg by mouth 2 (two) times daily.    [provider]  warfarin (COUMADIN) 3 MG tablet Take 3 mg by mouth daily.    [provider]  Zinc Oxide (TRIPLE PASTE) 12.8 % ointment Apply 1 application topically every 12 (twelve) hours.     [provider]    Family History Family History  Problem Relation Age of Onset  . Family history unknown: Yes    Social History Social History  Substance Use Topics  . Smoking status: Never Smoker  . Smokeless tobacco: Never Used  . Alcohol use No     Allergies   Latex; Macrobid [nitrofurantoin monohyd macro]; and Morphine and related   Review of Systems Review of Systems  Unable to perform ROS: Mental status change  Constitutional:  Negative for appetite change and fatigue.  HENT: Negative for congestion, ear discharge and sinus pressure.   Eyes: Negative for discharge.  Respiratory: Negative for cough and shortness of breath.   Cardiovascular: Negative for chest pain.  Gastrointestinal: Negative for abdominal pain and diarrhea.  Genitourinary: Negative for frequency and hematuria.  Musculoskeletal: Negative for back pain.  Skin: Negative for rash.  Neurological: Positive for weakness. Negative for focal weakness, seizures and headaches.  Psychiatric/Behavioral: Negative for hallucinations.     Physical Exam Updated Vital Signs BP 132/84   Pulse 86   Temp 100.3 F (37.9 C) (Rectal)   Resp (!) 28   Wt 72.6 kg (160 lb)   SpO2 96%   BMI 19.48 kg/m   Physical Exam  Constitutional: He is oriented to person, place, and time. He appears well-developed.  HENT:  Head: Normocephalic.  Dry mucous membrane  Eyes: Conjunctivae and EOM are normal. No scleral icterus.  Neck: Neck supple. No thyromegaly present.  Cardiovascular: Normal rate and regular rhythm.  Exam reveals no gallop and no friction rub.   No murmur heard. Pulmonary/Chest: No stridor. He has no wheezes. He has no rales. He exhibits no tenderness.  Abdominal: He exhibits no distension. There is no tenderness. There is no rebound.  Musculoskeletal: Normal range of motion. He exhibits no edema.  Lymphadenopathy:    He has no cervical adenopathy.  Neurological: He is oriented to person, place, and time. He exhibits normal muscle tone. Coordination normal.  Skin: No rash noted. No erythema.  Psychiatric: He has a normal mood and affect. His behavior is normal.     ED Treatments / Results  Labs (all labs ordered are listed, but only abnormal results are displayed) Labs Reviewed  COMPREHENSIVE METABOLIC PANEL - Abnormal; Notable for the following:       Result Value   Sodium 159 (*)    Potassium 3.2 (*)    Chloride 129 (*)    Glucose, Bld 164 (*)     BUN 105 (*)    Creatinine, Ser 2.44 (*)    Calcium 8.6 (*)    Albumin 2.7 (*)    ALT 10 (*)    GFR calc non Af Amer 25 (*)    GFR calc Af Amer 29 (*)    All other components within  normal limits  CBC WITH DIFFERENTIAL/PLATELET - Abnormal; Notable for the following:    WBC 14.2 (*)    Hemoglobin 12.6 (*)    Neutro Abs 10.5 (*)    All other components within normal limits  PROTIME-INR - Abnormal; Notable for the following:    Prothrombin Time 15.5 (*)    All other components within normal limits  URINALYSIS, ROUTINE W REFLEX MICROSCOPIC - Abnormal; Notable for the following:    APPearance HAZY (*)    Hgb urine dipstick MODERATE (*)    Protein, ur 30 (*)    Leukocytes, UA LARGE (*)    Bacteria, UA RARE (*)    Squamous Epithelial / LPF 0-5 (*)    All other components within normal limits  POC OCCULT BLOOD, ED - Abnormal; Notable for the following:    Fecal Occult Bld POSITIVE (*)    All other components within normal limits  CULTURE, BLOOD (ROUTINE X 2)  CULTURE, BLOOD (ROUTINE X 2)  URINE CULTURE  BASIC METABOLIC PANEL  CK  I-STAT CG4 LACTIC ACID, ED  I-STAT TROPONIN, ED  I-STAT CG4 LACTIC ACID, ED    EKG  EKG Interpretation None       Radiology Dg Chest 1 View  Result Date: 07/15/2017 CLINICAL DATA:  Nonverbal patient.  History of stroke. EXAM: CHEST 1 VIEW COMPARISON:  07/07/2016 FINDINGS: Cardiomediastinal silhouette is normal. Mediastinal contours appear intact. Calcific atherosclerotic disease and tortuosity of the aorta. There is no evidence of focal airspace consolidation, pleural effusion or pneumothorax. Osseous structures are without acute abnormality. Soft tissues are grossly normal. IMPRESSION: No active disease. Calcific atherosclerotic disease and tortuosity of the aorta. Electronically Signed   By: Ted Mcalpine M.D.   On: 07/15/2017 19:26    Procedures Procedures (including critical care time)  Medications Ordered in ED Medications    sodium chloride 0.9 % bolus 2,000 mL (0 mLs Intravenous Stopped 07/15/17 2230)  vancomycin (VANCOCIN) IVPB 1000 mg/200 mL premix (0 mg Intravenous Stopped 07/15/17 2201)  piperacillin-tazobactam (ZOSYN) IVPB 3.375 g (0 g Intravenous Stopped 07/15/17 2118)  pantoprazole (PROTONIX) injection 40 mg (40 mg Intravenous Given 07/15/17 2230)  sodium chloride 0.9 % bolus 1,000 mL (1,000 mLs Intravenous New Bag/Given 07/15/17 2230)     Initial Impression / Assessment and Plan / ED Course  I have reviewed the triage vital signs and the nursing notes.  Pertinent labs & imaging results that were available during my care of the patient were reviewed by me and considered in my medical decision making (see chart for details).     Patient with urinary tract infection dehydration and rectal bleeding. He will be admitted to medicine with GI consult  Final Clinical Impressions(s) / ED Diagnoses   Final diagnoses:  Lower urinary tract infectious disease  Acute sepsis [facility])    New Prescriptions New Prescriptions   No medications on file     Bethann Berkshire, MD 07/15/17 2233

## 2017-07-15 NOTE — H&P (Addendum)
Gilbert Lewis ZOX:[id number] DOB: Feb 08, 1946 DOA: 07/15/2017     PCP: Patient, No Pcp Per   Outpatient Specialists: None Patient coming from:  From facility [facility]  Chief Complaint: Not eating and drinking  HPI: Gilbert Lewis is a 71 y.o. male with medical history significant of hereditary spastic hemiplegia, CVA with resultant aphasia,  DM2 , and HTN,  history of ESBL infection. , HTN, hx of DVT    Presented with not eating and drinking anything for past 1 week per family have had possible blood in stool and PCP at nursing home have stop his Coumadin one week ago his INR on the 10/08 was 7.2. On October 10 patient was given vitamin K. Wife is unsure but seems that today his right lower extremity started to swell. He was brought in to emergency department was found to be hypernatremic up to 158 Discussed at length with family issue of dehydration secondary to decreased by mouth intake patient at his baseline is a good eater but for past 1 week stopped. Family states right after his stroke he did require PEG tube placement if needed family would be interested in possibly restarting tube feedings. Discussed possibility of filter placement in case patient does have DVT and ongoing GI bleed. Family at this point would like to avoid over aggressive interventions but agreeable to endoscopy. Had a St. discussion regarding goals of care at this point family would be interested in full code [place]  emerged department confirmed to have stools which is Hemoccult positive. CT of abdomen showed urinary retention with bilateral hydronephrosis and a large bladder likely contributing to abdominal discomfort as well as obstipation after that patient did have a large bowel movement significant for maroon stool.     Regarding pertinent Chronic problems: Recently admitted for early orchitis. Scrotal cellulitis treated with doxycycline and Keflex blood cultures 1 out of 2 coag-negative staph thought to be  contaminant. Known history of DVT wound Xarelto patient used to be on Coumadin  history of seizure disorder on Keppra   IN ER:  Temp (24hrs), Avg:100.3 F (37.9 C), Min:100.3 F (37.9 C), Max:100.3 F (37.9 C)      on arrival  ED Triage Vitals  Enc Vitals Group     BP 07/15/17 1837 (!) 150/91     Pulse Rate 07/15/17 1837 (!) 110     Resp 07/15/17 1837 13     Temp 07/15/17 2010 100.3 F (37.9 C)     Temp Source 07/15/17 2010 Rectal     SpO2 07/15/17 1837 96 %     Weight 07/15/17 1838 160 lb (72.6 kg)     Height --      Head Circumference --      Peak Flow --      Pain Score --      Pain Loc --      Pain Edu? --      Excl. in GC? --     Latest RR 21 satting 95% pulse 90 BP 124/ 75 Lactic acid 0.66 Hemoccult-positive Trop 0.01 Na 159 K 3.2 BUN 105 Cr 2.44 up from 1.08  Alb 2.7 WBC 14.2 Hg 12.6 plt 289 INR 1.24 CXR non acute Following Medications were ordered in ER: Medications  sodium chloride 0.9 % bolus 2,000 mL (2,000 mLs Intravenous New Bag/Given 07/15/17 1928)  vancomycin (VANCOCIN) IVPB 1000 mg/200 mL premix (0 mg Intravenous Stopped 07/15/17 2201)  piperacillin-tazobactam (ZOSYN) IVPB 3.375 g (0 g Intravenous Stopped 07/15/17 2118)  ER provider discussed case with:Dr. Loreta Ave with Gi Who recommends: Protonix IV We'll see patient in consult in the morning    Hospitalist was called for admission for UTI dehydration, AK I   Review of Systems:    Pertinent positives include: Decreased by mouth intake right leg swelling worsening confusion  Constitutional:  No weight loss, night sweats, Fevers, chills, fatigue, weight loss  HEENT:  No headaches, Difficulty swallowing,Tooth/dental problems,Sore throat,  No sneezing, itching, ear ache, nasal congestion, post nasal drip,  Cardio-vascular:  No chest pain, Orthopnea, PND, anasarca, dizziness, palpitations.no Bilateral lower extremity swelling  GI:  No heartburn, indigestion, abdominal pain, nausea, vomiting,  diarrhea, change in bowel habits, loss of appetite, melena, blood in stool, hematemesis Resp:  no shortness of breath at rest. No dyspnea on exertion, No excess mucus, no productive cough, No non-productive cough, No coughing up of blood.No change in color of mucus.No wheezing. Skin:  no rash or lesions. No jaundice GU:  no dysuria, change in color of urine, no urgency or frequency. No straining to urinate.  No flank pain.  Musculoskeletal:  No joint pain or no joint swelling. No decreased range of motion. No back pain.  Psych:  No change in mood or affect. No depression or anxiety. No memory loss.  Neuro: no localizing neurological complaints, no tingling, no weakness, no double vision, no gait abnormality, no slurred speech, no confusion  As per HPI otherwise 10 point review of systems negative.   Past Medical History: Past Medical History:  Diagnosis Date  . Cataract   . CKD (chronic kidney disease)   . Depression   . Diabetes mellitus without complication (HCC)   . Diabetic retinopathy (HCC)   . Hypertension   . Stroke [facility])    Past Surgical History:  Procedure Laterality Date  . APPENDECTOMY    . SKIN BIOPSY       Social History:    bed bound Aphasic at baseline   reports that he has never smoked. He has never used smokeless tobacco. He reports that he does not drink alcohol or use drugs.  Allergies:   Allergies  Allergen Reactions  . Latex Other (See Comments)    Per MAR  . Macrobid [employer] Macro] Other (See Comments)    Per MAR  . Morphine And Related Other (See Comments)    Per MAR       Family History:   Family History  Problem Relation Age of Onset  . Hypertension Other     Medications: Prior to Admission medications   Medication Sig Start Date End Date Taking? Authorizing Provider  acetaminophen (TYLENOL) 325 MG tablet Take 650 mg by mouth every 4 (four) hours as needed for mild pain.    [provider]    atorvastatin (LIPITOR) 10 MG tablet Take 1 tablet (10 mg total) by mouth daily at 6 PM. 07/09/16   Filbert Schilder, MD  baclofen (LIORESAL) 10 MG tablet Take 15 mg by mouth 3 (three) times daily. 0600, 1400, 2200    [provider]  Cholecalciferol 50000 units capsule Take 50,000 Units by mouth every 30 (thirty) days.    [provider]  docusate sodium (COLACE) 100 MG capsule Take 100 mg by mouth 2 (two) times daily.    [provider]  ferrous sulfate 325 (65 FE) MG tablet Take 325 mg by mouth daily with breakfast.    [provider]  finasteride (PROSCAR) 5 MG tablet Take 5 mg by mouth  every evening.     [provider]  Lavender Oil OIL 1 drop by Does not apply route as needed. Apply one drop behind each ear and on pillowcase as needed for agitation, anxiety, restlessness, insomnia. Discontinue use if skin irritation occurs    [provider]  levETIRAcetam (KEPPRA) 250 MG tablet Take 250 mg by mouth 2 (two) times daily.    [provider]  LORazepam (ATIVAN) 0.5 MG tablet Take 1 tablet (0.5 mg total) by mouth at bedtime. 06/21/17   Tyrone Nine, MD  losartan (COZAAR) 50 MG tablet Take 0.5 tablets (25 mg total) by mouth daily. 07/09/16   Filbert Schilder, MD  Melatonin 10 MG TABS Take 10 mg by mouth at bedtime.     [provider]  metFORMIN (GLUCOPHAGE) 1000 MG tablet Take 500 mg by mouth 2 (two) times daily with a meal.     [provider]  metoprolol tartrate (LOPRESSOR) 25 MG tablet Take 0.5 tablets (12.5 mg total) by mouth 2 (two) times daily. 07/10/16   Filbert Schilder, MD  Multiple Vitamins-Minerals (DECUBI-VITE) CAPS Take 1 capsule by mouth daily.    [provider]  nystatin (MYCOSTATIN/NYSTOP) powder Apply topically 4 (four) times daily. to scrotum/perineum. 06/21/17   Tyrone Nine, MD  omeprazole (PRILOSEC) 20 MG capsule Take 20 mg by mouth daily.     [provider]  OVER  THE COUNTER MEDICATION 1 drop by Other route as needed. "Lavender Oil" apply one drop behind each ear and on pillowcase as needed for agitation, anxiety, restlessness, insomnia    [provider]  Oxycodone HCl 10 MG TABS Take one tablet by mouth every 6 hours as needed for severe pain 06/21/17   Tyrone Nine, MD  polyethylene glycol (MIRALAX / GLYCOLAX) packet Take 17 g by mouth daily.    [provider]  Propylene Glycol (SYSTANE BALANCE) 0.6 % SOLN Apply 1 drop to eye 2 (two) times daily.    [provider]  Protein (PROCEL) POWD Take 1 scoop by mouth 2 (two) times daily.    [provider]  QUEtiapine (SEROQUEL) 50 MG tablet Take 50 mg by mouth at bedtime.    [provider]  senna (SENOKOT) 8.6 MG tablet Take 2 tablets by mouth 2 (two) times daily.     [provider]  sertraline (ZOLOFT) 100 MG tablet Take 150 mg by mouth daily.    [provider]  solifenacin (VESICARE) 5 MG tablet Take 5 mg by mouth daily.     [provider]  sucralfate (CARAFATE) 1 g tablet Take 1 g by mouth 4 (four) times daily -  with meals and at bedtime.    [provider]  tamsulosin (FLOMAX) 0.4 MG CAPS capsule Take 0.4 mg by mouth daily after supper.    [provider]  ursodiol (ACTIGALL) 500 MG tablet Take 500 mg by mouth 2 (two) times daily.    [provider]  warfarin (COUMADIN) 3 MG tablet Take 3 mg by mouth daily.    [provider]  Zinc Oxide (TRIPLE PASTE) 12.8 % ointment Apply 1 application topically every 12 (twelve) hours.     [provider]    Physical Exam: Patient Vitals for the past 24 hrs:  BP Temp Temp src Pulse Resp SpO2 Weight  07/15/17 2149 124/75 - - 90 (!) 21 95 % -  07/15/17 2100 (!) 147/79 - - 92 (!) 22 96 % -  07/15/17 2030 (!) 143/87 - - 93 18 96 % -  07/15/17 2010 - 100.3 F (37.9 C) Rectal - - - -  07/15/17 2000 (!) 152/85 - - 100 (!) 29 96 % -  07/15/17 1945 (!)  143/78 - - 66 18 94 % -  07/15/17 1900 (!) 157/88 - - 71 17 94 % -  07/15/17 1838 - - - - - - 72.6 kg (160 lb)  07/15/17 1837 (!) 150/91 - - (!) 110 13 96 % -    1. General:  in No Acute distress  Chronically ill   -appearing 2. Psychological: Alert Moaning in pain when touched  aphasic 3. Head/ENT:    Dry Mucous Membranes                          Head Non traumatic, neck supple                        Poor Dentition 4. SKIN:  decreased Skin turgor,  Skin clean Dry and intact no rash 5. Heart: Regular rate and rhythm no Murmur, no Rub or gallop 6. Lungs  no wheezes or crackles   7. Abdomen: Soft,  Tender, firm  distended   8. Lower extremities: no clubbing, cyanosis R>L edema 9. Neurologically  diminished movement on the right which is chronic 10. MSK: Normal range of motion   body mass index is 19.48 kg/m.  Labs on Admission:   Labs on Admission: I have personally reviewed following labs and imaging studies  CBC:  Recent Labs Lab 07/15/17 1847  WBC 14.2*  NEUTROABS 10.5*  HGB 12.6*  HCT 40.9  MCV 87.6  PLT 289   Basic Metabolic Panel:  Recent Labs Lab 07/15/17 1847  NA 159*  K 3.2*  CL 129*  CO2 22  GLUCOSE 164*  BUN 105*  CREATININE 2.44*  CALCIUM 8.6*   GFR: Estimated Creatinine Clearance: 29.3 mL/min (A) (by C-G formula based on SCr of 2.44 mg/dL (H)). Liver Function Tests:  Recent Labs Lab 07/15/17 1847  AST 19  ALT 10*  ALKPHOS 67  BILITOT 1.0  PROT 7.1  ALBUMIN 2.7*   No results for input(s): LIPASE, AMYLASE in the last 168 hours. No results for input(s): AMMONIA in the last 168 hours. Coagulation Profile:  Recent Labs Lab 07/15/17 1847  INR 1.24   Cardiac Enzymes: No results for input(s): CKTOTAL, CKMB, CKMBINDEX, TROPONINI in the last 168 hours. BNP (last 3 results) No results for input(s): PROBNP in the last 8760 hours. HbA1C: No results for input(s): HGBA1C in the last 72 hours. CBG: No results for input(s): GLUCAP in the  last 168 hours. Lipid Profile: No results for input(s): CHOL, HDL, LDLCALC, TRIG, CHOLHDL, LDLDIRECT in the last 72 hours. Thyroid Function Tests: No results for input(s): TSH, T4TOTAL, FREET4, T3FREE, THYROIDAB in the last 72 hours. Anemia Panel: No results for input(s): VITAMINB12, FOLATE, FERRITIN, TIBC, IRON, RETICCTPCT in the last 72 hours. Urine analysis:    Component Value Date/Time   COLORURINE YELLOW 07/15/2017 2008   APPEARANCEUR HAZY (A) 07/15/2017 2008   LABSPEC 1.016 07/15/2017 2008   PHURINE 6.0 07/15/2017 2008   GLUCOSEU NEGATIVE 07/15/2017 2008   HGBUR MODERATE (A) 07/15/2017 2008   BILIRUBINUR NEGATIVE 07/15/2017 2008   KETONESUR NEGATIVE 07/15/2017 2008   PROTEINUR 30 (A) 07/15/2017 2008   NITRITE NEGATIVE 07/15/2017 2008   LEUKOCYTESUR LARGE (A) 07/15/2017 2008   Sepsis  Labs: (procalcitonin:4,lacticidven:4) )No results found for this or any previous visit (from the past 240 hour(s)).     UA posssible UT   Lab Results  Component Value Date   HGBA1C 6.0 11/02/2016    Estimated Creatinine Clearance: 29.3 mL/min (A) (by C-G formula based on SCr of 2.44 mg/dL (H)).  BNP (last 3 results) No results for input(s): PROBNP in the last 8760 hours.   ECG REPORT ordered  [facility]   07/15/17 1838  Weight: 72.6 kg (160 lb)     Cultures:    Component Value Date/Time   SDES URINE, RANDOM 06/19/2017 2236   SPECREQUEST ADDED 0352 06/20/17 06/19/2017 2236   CULT MULTIPLE SPECIES PRESENT, SUGGEST RECOLLECTION (A) 06/19/2017 2236   REPTSTATUS 06/21/2017 FINAL 06/19/2017 2236     Radiological Exams on Admission: Dg Chest 1 View  Result Date: 07/15/2017 CLINICAL DATA:  Nonverbal patient.  History of stroke. EXAM: CHEST 1 VIEW COMPARISON:  07/07/2016 FINDINGS: Cardiomediastinal silhouette is normal. Mediastinal contours appear intact. Calcific atherosclerotic disease and tortuosity of the aorta. There is no evidence of focal airspace consolidation,  pleural effusion or pneumothorax. Osseous structures are without acute abnormality. Soft tissues are grossly normal. IMPRESSION: No active disease. Calcific atherosclerotic disease and tortuosity of the aorta. Electronically Signed   By: Ted Mcalpine M.D.   On: 07/15/2017 19:26    Chart has been reviewed    Assessment/Plan   71 y.o. male with medical history significant of hereditary spastic hemiplegia, CVA with resultant aphasia,  DM2 , and HTN,  history of ESBL infection. , HTN, hx of DVT  Admitted for melena, UTI dehydration, AK I    Present on Admission: . GI bleed -  - Glasgow Blatchford score BUN >18.2  , Hg  13  Systolic   HR >100  melena    >1 Justifies admission and aggressive management      Modifying risk factors include:     anticoagulation,        -    AIMS 65 = Alb <3,   Mental status change   TOTAL of 2               Worrisome        hemodynamic instability present     Admit to stepdown given above   -  ER  Provider spoke to gastroenterology Loreta Ave) they will see patient in a.m. appreciate their consult   - serial CBC.    - Monitor for any recurrence,  evidence of hemodynamic instability or significant blood loss  - Transfuse as needed for hemoglobin below 7 or evidence of life-threatening bleeding  - Establish at least 2 PIV and fluid resuscitate   - clear liquids for tonight keep nothing by mouth post midnight,   -  administer Protonix  drip    . Acute lower UTI history of UTI  History of the ESBL - obtain urine culture for now cover broadly . Anemia, iron deficiency - likely secondary to GI blood losses appreciate GI consult transfuse as needed . Melena is sent for upper GI bleed . CKD (chronic kidney disease) stage 3, GFR 30-59 ml/min (HCC) avoid nephrotoxic medications . Diabetes mellitus with complication (HCC) -  - Order Sensitive   SSI   -  check TSH and HgA1C  - Hold by mouth medications    . Essential hypertension allow permissive hypertension  for today given GI bleeding . Moderate protein-calorie malnutrition (HCC) order nutritional consult check prealbumin patient if does  not resume eating family was interested in possible PEG placement . Sepsis (HCC) source was most likely being urine covered with broad-spectrum antibiotics await results of your blood cultures . Hypernatremia initiate hypotonic fluids and monitor sodium  . Hypokalemia replace check on easy level . Dehydration rehydrate and monitor to stepdown for monitoring given severe electrolyte disturbance . AKI (acute kidney injury) (HCC) acne setting dehydration as well as urinary retention  . Acute urinary retention - Place Foley prior to the benefit from urology consult and follow-up  R leg edema - prior DVT was 2 years ago - will obtain doppler  History of seizure disorder continue Keppra switched to IV while not able tolerate by mouth  Other plan as per orders.  DVT prophylaxis:  SCD      Code Status:  FULL CODE    as per   family   Family Communication:   Family at  Bedside  plan of care was discussed with  Wife,   Disposition Plan:                             Back to current facility when stable                                                    Would benefit from PT/OT eval prior to DC  ordered                      Social Work    Nutrition    consulted                          Consults called: GI Dr. Loreta Ave  Admission status:   inpatient     Level of care        SDU      I have spent a total of 76 min on this admission  extra time was spent to discuss goals of   care with  family  Sandeep Delagarza 07/16/2017, 5:07 AM    [facility]  Pager [phone]   after 2 AM please page floor coverage PA If 7AM-7PM, please contact the day team taking care of the patient  [email or web address]  Password [email or web address]

## 2017-07-16 ENCOUNTER — Encounter (HOSPITAL_COMMUNITY): Payer: Self-pay | Admitting: Internal Medicine

## 2017-07-16 ENCOUNTER — Inpatient Hospital Stay (HOSPITAL_COMMUNITY): Payer: Medicare Other

## 2017-07-16 DIAGNOSIS — D508 Other iron deficiency anemias: Secondary | ICD-10-CM | POA: Diagnosis present

## 2017-07-16 DIAGNOSIS — N136 Pyonephrosis: Secondary | ICD-10-CM | POA: Diagnosis present

## 2017-07-16 DIAGNOSIS — E870 Hyperosmolality and hypernatremia: Secondary | ICD-10-CM | POA: Diagnosis present

## 2017-07-16 DIAGNOSIS — N179 Acute kidney failure, unspecified: Secondary | ICD-10-CM | POA: Diagnosis present

## 2017-07-16 DIAGNOSIS — E11319 Type 2 diabetes mellitus with unspecified diabetic retinopathy without macular edema: Secondary | ICD-10-CM | POA: Diagnosis present

## 2017-07-16 DIAGNOSIS — Z872 Personal history of diseases of the skin and subcutaneous tissue: Secondary | ICD-10-CM | POA: Diagnosis not present

## 2017-07-16 DIAGNOSIS — R627 Adult failure to thrive: Secondary | ICD-10-CM | POA: Diagnosis present

## 2017-07-16 DIAGNOSIS — R338 Other retention of urine: Secondary | ICD-10-CM | POA: Diagnosis present

## 2017-07-16 DIAGNOSIS — I129 Hypertensive chronic kidney disease with stage 1 through stage 4 chronic kidney disease, or unspecified chronic kidney disease: Secondary | ICD-10-CM | POA: Diagnosis present

## 2017-07-16 DIAGNOSIS — G40909 Epilepsy, unspecified, not intractable, without status epilepticus: Secondary | ICD-10-CM | POA: Diagnosis present

## 2017-07-16 DIAGNOSIS — L8990 Pressure ulcer of unspecified site, unspecified stage: Secondary | ICD-10-CM | POA: Diagnosis present

## 2017-07-16 DIAGNOSIS — E1122 Type 2 diabetes mellitus with diabetic chronic kidney disease: Secondary | ICD-10-CM | POA: Diagnosis present

## 2017-07-16 DIAGNOSIS — D509 Iron deficiency anemia, unspecified: Secondary | ICD-10-CM | POA: Diagnosis not present

## 2017-07-16 DIAGNOSIS — I69920 Aphasia following unspecified cerebrovascular disease: Secondary | ICD-10-CM | POA: Diagnosis not present

## 2017-07-16 DIAGNOSIS — K922 Gastrointestinal hemorrhage, unspecified: Secondary | ICD-10-CM | POA: Diagnosis present

## 2017-07-16 DIAGNOSIS — E440 Moderate protein-calorie malnutrition: Secondary | ICD-10-CM | POA: Diagnosis present

## 2017-07-16 DIAGNOSIS — I69391 Dysphagia following cerebral infarction: Secondary | ICD-10-CM | POA: Diagnosis not present

## 2017-07-16 DIAGNOSIS — A419 Sepsis, unspecified organism: Secondary | ICD-10-CM | POA: Diagnosis present

## 2017-07-16 DIAGNOSIS — B952 Enterococcus as the cause of diseases classified elsewhere: Secondary | ICD-10-CM | POA: Diagnosis present

## 2017-07-16 DIAGNOSIS — E876 Hypokalemia: Secondary | ICD-10-CM | POA: Diagnosis present

## 2017-07-16 DIAGNOSIS — E871 Hypo-osmolality and hyponatremia: Secondary | ICD-10-CM | POA: Diagnosis present

## 2017-07-16 DIAGNOSIS — I10 Essential (primary) hypertension: Secondary | ICD-10-CM | POA: Diagnosis not present

## 2017-07-16 DIAGNOSIS — Z7401 Bed confinement status: Secondary | ICD-10-CM | POA: Diagnosis not present

## 2017-07-16 DIAGNOSIS — Z86718 Personal history of other venous thrombosis and embolism: Secondary | ICD-10-CM | POA: Diagnosis not present

## 2017-07-16 DIAGNOSIS — I69320 Aphasia following cerebral infarction: Secondary | ICD-10-CM | POA: Diagnosis not present

## 2017-07-16 DIAGNOSIS — K5641 Fecal impaction: Secondary | ICD-10-CM | POA: Diagnosis present

## 2017-07-16 DIAGNOSIS — E118 Type 2 diabetes mellitus with unspecified complications: Secondary | ICD-10-CM | POA: Diagnosis not present

## 2017-07-16 DIAGNOSIS — I69359 Hemiplegia and hemiparesis following cerebral infarction affecting unspecified side: Secondary | ICD-10-CM | POA: Diagnosis not present

## 2017-07-16 DIAGNOSIS — K921 Melena: Secondary | ICD-10-CM | POA: Diagnosis present

## 2017-07-16 DIAGNOSIS — E878 Other disorders of electrolyte and fluid balance, not elsewhere classified: Secondary | ICD-10-CM | POA: Diagnosis present

## 2017-07-16 DIAGNOSIS — N183 Chronic kidney disease, stage 3 (moderate): Secondary | ICD-10-CM | POA: Diagnosis present

## 2017-07-16 DIAGNOSIS — N390 Urinary tract infection, site not specified: Secondary | ICD-10-CM | POA: Diagnosis present

## 2017-07-16 DIAGNOSIS — A491 Streptococcal infection, unspecified site: Secondary | ICD-10-CM | POA: Diagnosis not present

## 2017-07-16 DIAGNOSIS — E860 Dehydration: Secondary | ICD-10-CM | POA: Diagnosis present

## 2017-07-16 DIAGNOSIS — Z1621 Resistance to vancomycin: Secondary | ICD-10-CM | POA: Diagnosis not present

## 2017-07-16 LAB — RENAL FUNCTION PANEL
ALBUMIN: 2.4 g/dL — AB (ref 3.5–5.0)
ANION GAP: 6 (ref 5–15)
BUN: 63 mg/dL — ABNORMAL HIGH (ref 6–20)
CO2: 19 mmol/L — ABNORMAL LOW (ref 22–32)
Calcium: 7.7 mg/dL — ABNORMAL LOW (ref 8.9–10.3)
Chloride: 129 mmol/L — ABNORMAL HIGH (ref 101–111)
Creatinine, Ser: 1.7 mg/dL — ABNORMAL HIGH (ref 0.61–1.24)
GFR calc Af Amer: 46 mL/min — ABNORMAL LOW (ref >60)
GFR, EST NON AFRICAN AMERICAN: 39 mL/min — AB (ref >60)
GLUCOSE: 124 mg/dL — AB (ref 65–99)
PHOSPHORUS: 2.1 mg/dL — AB (ref 2.5–4.6)
POTASSIUM: 3.2 mmol/L — AB (ref 3.5–5.1)
SODIUM: 154 mmol/L — AB (ref 135–145)

## 2017-07-16 LAB — BASIC METABOLIC PANEL
BUN: 85 mg/dL — ABNORMAL HIGH (ref 6–20)
BUN: 68 mg/dL — ABNORMAL HIGH (ref 6–20)
CALCIUM: 7.6 mg/dL — AB (ref 8.9–10.3)
CALCIUM: 7.8 mg/dL — AB (ref 8.9–10.3)
CO2: 19 mmol/L — ABNORMAL LOW (ref 22–32)
CO2: 23 mmol/L (ref 22–32)
Chloride: >130 mmol/L (ref 101–111)
Chloride: >130 mmol/L (ref 101–111)
Creatinine, Ser: 2.03 mg/dL — ABNORMAL HIGH (ref 0.61–1.24)
Creatinine, Ser: 1.83 mg/dL — ABNORMAL HIGH (ref 0.61–1.24)
GFR, EST AFRICAN AMERICAN: 37 mL/min — AB (ref >60)
GFR, EST AFRICAN AMERICAN: 42 mL/min — AB (ref >60)
GFR, EST NON AFRICAN AMERICAN: 32 mL/min — AB (ref >60)
GFR, EST NON AFRICAN AMERICAN: 36 mL/min — AB (ref >60)
GLUCOSE: 146 mg/dL — AB (ref 65–99)
Glucose, Bld: 123 mg/dL — ABNORMAL HIGH (ref 65–99)
Potassium: 3.4 mmol/L — ABNORMAL LOW (ref 3.5–5.1)
Potassium: 3.6 mmol/L (ref 3.5–5.1)
SODIUM: 158 mmol/L — AB (ref 135–145)
SODIUM: 158 mmol/L — AB (ref 135–145)

## 2017-07-16 LAB — CBC
HCT: 35.5 % — ABNORMAL LOW (ref 39.0–52.0)
HCT: 35.4 % — ABNORMAL LOW (ref 39.0–52.0)
HEMATOCRIT: 35.2 % — AB (ref 39.0–52.0)
HEMOGLOBIN: 10.7 g/dL — AB (ref 13.0–17.0)
HEMOGLOBIN: 10.7 g/dL — AB (ref 13.0–17.0)
Hemoglobin: 10.6 g/dL — ABNORMAL LOW (ref 13.0–17.0)
MCH: 26.6 pg (ref 26.0–34.0)
MCH: 26.6 pg (ref 26.0–34.0)
MCH: 26.9 pg (ref 26.0–34.0)
MCHC: 30.1 g/dL (ref 30.0–36.0)
MCHC: 29.9 g/dL — ABNORMAL LOW (ref 30.0–36.0)
MCHC: 30.4 g/dL (ref 30.0–36.0)
MCV: 88.3 fL (ref 78.0–100.0)
MCV: 88.7 fL (ref 78.0–100.0)
MCV: 88.4 fL (ref 78.0–100.0)
PLATELETS: 196 10*3/uL (ref 150–400)
Platelets: 218 10*3/uL (ref 150–400)
Platelets: 205 10*3/uL (ref 150–400)
RBC: 4.02 MIL/uL — AB (ref 4.22–5.81)
RBC: 3.99 MIL/uL — AB (ref 4.22–5.81)
RBC: 3.98 MIL/uL — AB (ref 4.22–5.81)
RDW: 15.2 % (ref 11.5–15.5)
RDW: 15.5 % (ref 11.5–15.5)
RDW: 15.2 % (ref 11.5–15.5)
WBC: 12.3 10*3/uL — ABNORMAL HIGH (ref 4.0–10.5)
WBC: 11.3 10*3/uL — AB (ref 4.0–10.5)
WBC: 12.7 10*3/uL — AB (ref 4.0–10.5)

## 2017-07-16 LAB — LIPASE, BLOOD: LIPASE: 55 U/L — AB (ref 11–51)

## 2017-07-16 LAB — GLUCOSE, CAPILLARY
GLUCOSE-CAPILLARY: 108 mg/dL — AB (ref 65–99)
GLUCOSE-CAPILLARY: 119 mg/dL — AB (ref 65–99)

## 2017-07-16 LAB — HEMOGLOBIN A1C
Hgb A1c MFr Bld: 5.9 % — ABNORMAL HIGH (ref 4.8–5.6)
MEAN PLASMA GLUCOSE: 122.63 mg/dL

## 2017-07-16 LAB — MAGNESIUM: MAGNESIUM: 1.6 mg/dL — AB (ref 1.7–2.4)

## 2017-07-16 LAB — PREALBUMIN: Prealbumin: 11 mg/dL — ABNORMAL LOW (ref 18–38)

## 2017-07-16 LAB — MRSA PCR SCREENING: MRSA BY PCR: NEGATIVE

## 2017-07-16 LAB — TROPONIN I

## 2017-07-16 MED ORDER — SODIUM CHLORIDE 0.9 % IV SOLN
8.0000 mg/h | INTRAVENOUS | Status: DC
  Administered 2017-07-16: 8 mg/h via INTRAVENOUS
  Filled 2017-07-16 (×2): qty 80

## 2017-07-16 MED ORDER — ACETAMINOPHEN 325 MG PO TABS
650.0000 mg | ORAL_TABLET | Freq: Four times a day (QID) | ORAL | Status: DC | PRN
  Administered 2017-07-21: 650 mg via ORAL
  Filled 2017-07-16: qty 2

## 2017-07-16 MED ORDER — CHLORHEXIDINE GLUCONATE 0.12 % MT SOLN
15.0000 mL | Freq: Two times a day (BID) | OROMUCOSAL | Status: DC
  Administered 2017-07-16: 15 mL via OROMUCOSAL
  Filled 2017-07-16: qty 15

## 2017-07-16 MED ORDER — SODIUM CHLORIDE 0.9 % IV SOLN
1.0000 g | Freq: Two times a day (BID) | INTRAVENOUS | Status: DC
  Administered 2017-07-16 – 2017-07-18 (×5): 1 g via INTRAVENOUS
  Filled 2017-07-16 (×5): qty 1

## 2017-07-16 MED ORDER — SODIUM CHLORIDE 0.9 % IV SOLN
250.0000 mg | Freq: Two times a day (BID) | INTRAVENOUS | Status: DC
  Administered 2017-07-16 – 2017-07-20 (×9): 250 mg via INTRAVENOUS
  Filled 2017-07-16 (×10): qty 2.5

## 2017-07-16 MED ORDER — THIAMINE HCL 100 MG/ML IJ SOLN
100.0000 mg | Freq: Every day | INTRAMUSCULAR | Status: DC
  Administered 2017-07-18 – 2017-07-20 (×3): 100 mg via INTRAVENOUS
  Filled 2017-07-16 (×4): qty 2

## 2017-07-16 MED ORDER — DEXTROSE-NACL 5-0.45 % IV SOLN
INTRAVENOUS | Status: DC
  Administered 2017-07-16: 02:00:00 via INTRAVENOUS

## 2017-07-16 MED ORDER — FINASTERIDE 5 MG PO TABS
5.0000 mg | ORAL_TABLET | Freq: Every evening | ORAL | Status: DC
  Filled 2017-07-16: qty 1

## 2017-07-16 MED ORDER — ORAL CARE MOUTH RINSE
15.0000 mL | Freq: Two times a day (BID) | OROMUCOSAL | Status: DC
  Administered 2017-07-17 – 2017-07-22 (×9): 15 mL via OROMUCOSAL

## 2017-07-16 MED ORDER — ONDANSETRON HCL 4 MG/2ML IJ SOLN
4.0000 mg | Freq: Four times a day (QID) | INTRAMUSCULAR | Status: DC | PRN
  Administered 2017-07-20: 4 mg via INTRAVENOUS
  Filled 2017-07-16: qty 2

## 2017-07-16 MED ORDER — DEXTROSE 5 % IV SOLN
INTRAVENOUS | Status: DC
  Administered 2017-07-16: 1000 mL via INTRAVENOUS
  Administered 2017-07-18 – 2017-07-19 (×3): via INTRAVENOUS

## 2017-07-16 MED ORDER — POTASSIUM CHLORIDE 10 MEQ/100ML IV SOLN
10.0000 meq | INTRAVENOUS | Status: AC
  Administered 2017-07-16 (×4): 10 meq via INTRAVENOUS
  Filled 2017-07-16 (×4): qty 100

## 2017-07-16 MED ORDER — ACETAMINOPHEN 650 MG RE SUPP: 650.0000 mg | Freq: Four times a day (QID) | RECTAL | Status: DC | PRN

## 2017-07-16 MED ORDER — TAMSULOSIN HCL 0.4 MG PO CAPS
0.4000 mg | ORAL_CAPSULE | Freq: Every day | ORAL | Status: DC
  Administered 2017-07-18 – 2017-07-22 (×5): 0.4 mg via ORAL
  Filled 2017-07-16 (×5): qty 1

## 2017-07-16 MED ORDER — PANTOPRAZOLE SODIUM 40 MG IV SOLR
40.0000 mg | Freq: Two times a day (BID) | INTRAVENOUS | Status: DC
  Administered 2017-07-19 – 2017-07-20 (×2): 40 mg via INTRAVENOUS
  Filled 2017-07-16 (×3): qty 40

## 2017-07-16 MED ORDER — PANTOPRAZOLE SODIUM 40 MG IV SOLR
40.0000 mg | Freq: Two times a day (BID) | INTRAVENOUS | Status: DC
  Administered 2017-07-16: 40 mg via INTRAVENOUS
  Filled 2017-07-16: qty 40

## 2017-07-16 MED ORDER — INSULIN ASPART 100 UNIT/ML ~~LOC~~ SOLN
0.0000 [IU] | SUBCUTANEOUS | Status: DC
  Administered 2017-07-17 – 2017-07-18 (×3): 1 [IU] via SUBCUTANEOUS
  Administered 2017-07-18: 2 [IU] via SUBCUTANEOUS
  Administered 2017-07-18 – 2017-07-19 (×4): 1 [IU] via SUBCUTANEOUS

## 2017-07-16 MED ORDER — LIDOCAINE HCL 2 % EX GEL
1.0000 "application " | Freq: Once | CUTANEOUS | Status: AC
  Administered 2017-07-16: 1 via URETHRAL
  Filled 2017-07-16: qty 20

## 2017-07-16 MED ORDER — URSODIOL 300 MG PO CAPS
600.0000 mg | ORAL_CAPSULE | Freq: Two times a day (BID) | ORAL | Status: DC
  Filled 2017-07-16 (×5): qty 2

## 2017-07-16 MED ORDER — MAGNESIUM SULFATE 2 GM/50ML IV SOLN
2.0000 g | Freq: Once | INTRAVENOUS | Status: AC
  Administered 2017-07-16: 2 g via INTRAVENOUS
  Filled 2017-07-16: qty 50

## 2017-07-16 MED ORDER — ONDANSETRON HCL 4 MG PO TABS: 4.0000 mg | ORAL_TABLET | Freq: Four times a day (QID) | ORAL | Status: DC | PRN

## 2017-07-16 MED ORDER — FOLIC ACID 5 MG/ML IJ SOLN
1.0000 mg | Freq: Every day | INTRAMUSCULAR | Status: DC
  Administered 2017-07-16 – 2017-07-20 (×4): 1 mg via INTRAVENOUS
  Filled 2017-07-16 (×5): qty 0.2

## 2017-07-16 MED ORDER — POTASSIUM CHLORIDE 10 MEQ/100ML IV SOLN
10.0000 meq | INTRAVENOUS | Status: AC
  Administered 2017-07-17 (×4): 10 meq via INTRAVENOUS
  Filled 2017-07-16 (×4): qty 100

## 2017-07-16 NOTE — ED Notes (Signed)
Patient transported to CT 

## 2017-07-16 NOTE — ED Notes (Signed)
Pt moved to hospital bed.

## 2017-07-16 NOTE — Progress Notes (Signed)
Pharmacy Antibiotic Note  Gilbert Lewis is a 71 y.o. male admitted on 07/15/2017 from NH with AMS/UTI.  Pharmacy has been consulted for Meropenem dosing.  Plan: Meropenem 1 g IV q12h  Weight: 160 lb (72.6 kg)  Temp (24hrs), Avg:99.3 F (37.4 C), Min:98.3 F (36.8 C), Max:100.3 F (37.9 C)   Recent Labs Lab 07/15/17 1847 07/15/17 2124 07/15/17 2128 07/16/17 0343  WBC 14.2*  --   --  12.3*  CREATININE 2.44* 2.26*  --  2.03*  LATICACIDVEN  --   --  0.66  --     Estimated Creatinine Clearance: 35.3 mL/min (A) (by C-G formula based on SCr of 2.03 mg/dL (H)).      Eddie Candle 07/16/2017 7:25 AM

## 2017-07-16 NOTE — Consult Note (Addendum)
CROSS COVER [facility] Reason for Consult: Melenic stools. Referring Physician: ER MD  Gilbert Lewis is an 71 y.o. male.  HPI: Gilbert Lewis is a 71 year old white male transferred from Place to the [facility]: Emergency room yesterday for failure to thrive. Reportedly he has had 1 week history of poor oral intake with some dark stools noted by the staff at [facility] place. Reportedly the patient had an INR of 7.2 on 07/10/2017 and his Coumadin was discontinued by his PCP and on October 10 he was given some vitamin K. 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 CT scan of the abdomen and pelvis admission revealed him markedly enlarged bladder up to the level of the umbilicus along with bilateral hydronephrosis and urinary retention with obstipation and markedly enlarged rectum with fecal impaction. Patient is nonverbal and is not able to give any history on his own and there is no family member at his bedside. However, in spite of aggressive hydration the patient has not dropped his hemoglobin after coming to the emergency room. There has been no evidence of melena or hematochezia since his arrival in the ER. He was quaiac positive on exam in the ER. He's had a PEG in the past shortly after his stroke and as per the notes after admission family has shown some desire to have him fed by tube feeds. He was also found to be hyponatremic on evaluation in the ER. Currently he is a full code  Past Medical History:  Diagnosis Date  . Cataract   . CKD (chronic kidney disease)   . Depression   . Diabetes mellitus without complication ([place])   . Diabetic retinopathy ([place])   . Hypertension   . Stroke [facility])    Past Surgical History:  Procedure Laterality Date  . APPENDECTOMY    . SKIN BIOPSY     Family History  Problem Relation Age of Onset  . Hypertension  Other    Social History:  reports that he has never smoked. He has never used smokeless tobacco. He reports that he does not drink alcohol or use drugs.  Allergies:  Allergies  Allergen Reactions  . Latex Other (See Comments)    Per MAR  . Macrobid [employer] Macro] Other (See Comments)    Per MAR  . Morphine And Related Other (See Comments)    Per MAR   Medications: I have reviewed the patient's current medications.  Results for orders placed or performed during the hospital encounter of 07/15/17 (from the past 48 hour(s))  Comprehensive metabolic panel     Status: Abnormal   Collection Time: 07/15/17  6:47 PM  Result Value Ref Range   Sodium 159 (H) 135 - 145 mmol/L   Potassium 3.2 (L) 3.5 - 5.1 mmol/L   Chloride 129 (H) 101 - 111 mmol/L   CO2 22 22 - 32 mmol/L   Glucose, Bld 164 (H) 65 - 99 mg/dL   BUN 105 (H) 6 - 20 mg/dL   Creatinine, Ser 2.44 (H) 0.61 - 1.24 mg/dL   Calcium 8.6 (L) 8.9 - 10.3 mg/dL   Total Protein 7.1 6.5 - 8.1 g/dL   Albumin 2.7 (L) 3.5 - 5.0 g/dL   AST 19 15 - 41 U/L   ALT 10 (L) 17 - 63 U/L   Alkaline Phosphatase 67 38 - 126 U/L   Total Bilirubin 1.0  0.3 - 1.2 mg/dL   GFR calc non Af Amer 25 (L) >60 mL/min   GFR calc Af Amer 29 (L) >60 mL/min    Comment: (NOTE) The eGFR has been calculated using the CKD EPI equation. This calculation has not been validated in all clinical situations. eGFR's persistently <60 mL/min signify possible Chronic Kidney Disease.    Anion gap 8 5 - 15  CBC with Differential     Status: Abnormal   Collection Time: 07/15/17  6:47 PM  Result Value Ref Range   WBC 14.2 (H) 4.0 - 10.5 K/uL   RBC 4.67 4.22 - 5.81 MIL/uL   Hemoglobin 12.6 (L) 13.0 - 17.0 g/dL   HCT 40.9 39.0 - 52.0 %   MCV 87.6 78.0 - 100.0 fL   MCH 27.0 26.0 - 34.0 pg   MCHC 30.8 30.0 - 36.0 g/dL   RDW 15.4 11.5 - 15.5 %   Platelets 289 150 - 400 K/uL   Neutrophils Relative % 74 %   Lymphocytes Relative 19 %   Monocytes Relative 7 %    Eosinophils Relative 0 %   Basophils Relative 0 %   Neutro Abs 10.5 (H) 1.7 - 7.7 K/uL   Lymphs Abs 2.7 0.7 - 4.0 K/uL   Monocytes Absolute 1.0 0.1 - 1.0 K/uL   Eosinophils Absolute 0.0 0.0 - 0.7 K/uL   Basophils Absolute 0.0 0.0 - 0.1 K/uL   WBC Morphology ATYPICAL LYMPHOCYTES   Protime-INR     Status: Abnormal   Collection Time: 07/15/17  6:47 PM  Result Value Ref Range   Prothrombin Time 15.5 (H) 11.4 - 15.2 seconds   INR 1.24   I-stat troponin, ED     Status: None   Collection Time: 07/15/17  6:54 PM  Result Value Ref Range   Troponin i, poc 0.01 0.00 - 0.08 ng/mL   Comment 3            Comment: Due to the release kinetics of cTnI, a negative result within the first hours of the onset of symptoms does not rule out myocardial infarction with certainty. If myocardial infarction is still suspected, repeat the test at appropriate intervals.   Urinalysis, Routine w reflex microscopic     Status: Abnormal   Collection Time: 07/15/17  8:08 PM  Result Value Ref Range   Color, Urine YELLOW YELLOW   APPearance HAZY (A) CLEAR   Specific Gravity, Urine 1.016 1.005 - 1.030   pH 6.0 5.0 - 8.0   Glucose, UA NEGATIVE NEGATIVE mg/dL   Hgb urine dipstick MODERATE (A) NEGATIVE   Bilirubin Urine NEGATIVE NEGATIVE   Ketones, ur NEGATIVE NEGATIVE mg/dL   Protein, ur 30 (A) NEGATIVE mg/dL   Nitrite NEGATIVE NEGATIVE   Leukocytes, UA LARGE (A) NEGATIVE   RBC / HPF 6-30 0 - 5 RBC/hpf   WBC, UA TOO NUMEROUS TO COUNT 0 - 5 WBC/hpf   Bacteria, UA RARE (A) NONE SEEN   Squamous Epithelial / LPF 0-5 (A) NONE SEEN   Mucus PRESENT    Hyaline Casts, UA PRESENT   POC occult blood, ED RN will collect     Status: Abnormal   Collection Time: 07/15/17  8:16 PM  Result Value Ref Range   Fecal Occult Bld POSITIVE (A) NEGATIVE  Basic metabolic panel     Status: Abnormal   Collection Time: 07/15/17  9:24 PM  Result Value Ref Range   Sodium 158 (H) 135 - 145 mmol/L   Potassium 3.1 (L)  3.5 - 5.1 mmol/L    Chloride 129 (H) 101 - 111 mmol/L   CO2 21 (L) 22 - 32 mmol/L   Glucose, Bld 184 (H) 65 - 99 mg/dL   BUN 97 (H) 6 - 20 mg/dL   Creatinine, Ser 2.26 (H) 0.61 - 1.24 mg/dL   Calcium 7.8 (L) 8.9 - 10.3 mg/dL   GFR calc non Af Amer 28 (L) >60 mL/min   GFR calc Af Amer 32 (L) >60 mL/min    Comment: (NOTE) The eGFR has been calculated using the CKD EPI equation. This calculation has not been validated in all clinical situations. eGFR's persistently <60 mL/min signify possible Chronic Kidney Disease.    Anion gap 8 5 - 15  CK     Status: None   Collection Time: 07/15/17  9:24 PM  Result Value Ref Range   Total CK 78 49 - 397 U/L  I-Stat CG4 Lactic Acid, ED     Status: None   Collection Time: 07/15/17  9:28 PM  Result Value Ref Range   Lactic Acid, Venous 0.66 0.5 - 1.9 mmol/L  Basic metabolic panel     Status: Abnormal   Collection Time: 07/16/17  3:43 AM  Result Value Ref Range   Sodium 158 (H) 135 - 145 mmol/L   Potassium 3.4 (L) 3.5 - 5.1 mmol/L   Chloride >130 (HH) 101 - 111 mmol/L    Comment: CRITICAL RESULT CALLED TO, READ BACK BY AND VERIFIED WITH: OLDLAND B,RN 07/16/17 0421 WAYK    CO2 19 (L) 22 - 32 mmol/L   Glucose, Bld 123 (H) 65 - 99 mg/dL   BUN 85 (H) 6 - 20 mg/dL   Creatinine, Ser 2.03 (H) 0.61 - 1.24 mg/dL   Calcium 7.6 (L) 8.9 - 10.3 mg/dL   GFR calc non Af Amer 32 (L) >60 mL/min   GFR calc Af Amer 37 (L) >60 mL/min    Comment: (NOTE) The eGFR has been calculated using the CKD EPI equation. This calculation has not been validated in all clinical situations. eGFR's persistently <60 mL/min signify possible Chronic Kidney Disease.   CBC     Status: Abnormal   Collection Time: 07/16/17  3:43 AM  Result Value Ref Range   WBC 12.3 (H) 4.0 - 10.5 K/uL   RBC 4.02 (L) 4.22 - 5.81 MIL/uL   Hemoglobin 10.7 (L) 13.0 - 17.0 g/dL   HCT 35.5 (L) 39.0 - 52.0 %   MCV 88.3 78.0 - 100.0 fL   MCH 26.6 26.0 - 34.0 pg   MCHC 30.1 30.0 - 36.0 g/dL   RDW 15.2 11.5 - 15.5 %    Platelets 218 150 - 400 K/uL  Lipase, blood     Status: Abnormal   Collection Time: 07/16/17  3:43 AM  Result Value Ref Range   Lipase 55 (H) 11 - 51 U/L   Ct Abdomen Pelvis Wo Contrast  Result Date: 07/16/2017 CLINICAL DATA:  Lethargic.  Not eating or drinking. EXAM: CT ABDOMEN AND PELVIS WITHOUT CONTRAST TECHNIQUE: Multidetector CT imaging of the abdomen and pelvis was performed following the standard protocol without IV contrast. COMPARISON:  06/19/2017 FINDINGS: Lower chest: Mild linear atelectatic appearing opacities in the lung bases. No confluent consolidation. No effusions. Hepatobiliary: Subtle low-attenuation liver lesions, seen to better advantage on the contrast enhanced study of 06/19/2017. Multiple calculi within the gallbladder, measuring up to 1.5 cm. No bile duct dilatation. Pancreas: Unremarkable unenhanced appearances of the pancreas. Spleen: Unremarkable unenhanced appearances of the  spleen. Adrenals/Urinary Tract: Both adrenals are normal. There is marked distention of the urinary bladder, up to several cm above the level of the umbilicus. There is prominent ureteral dilatation and hydronephrosis bilaterally. This is new from 06/19/2017. Collecting system calculi are again evident bilaterally. No evidence of ureteral calculi. Low-attenuation right renal lesions are again evident, unchanged and likely cysts. Stomach/Bowel: Marked rectal distention with stool, possible fecal impaction. No focal bowel lesion. No focal bowel inflammation. No extraluminal gas. Vascular/Lymphatic: The abdominal aorta is normal in caliber with moderate atherosclerotic calcification. No adenopathy in the abdomen or pelvis. Reproductive: Unremarkable Musculoskeletal: No significant skeletal lesion. Bridging anterior osteophytes of the lumbar spine IMPRESSION: 1. Marked urinary bladder distention, well above the level of the umbilicus. Associated ureteral dilatation and hydronephrosis bilaterally. 2. Marked  rectal distention with stool, possible fecal impaction. 3. Bilateral nephrolithiasis, nonobstructing. 4. Low-attenuation liver lesions, indeterminate. Unchanged from 06/19/17 5. Cholelithiasis. 6. Aortic atherosclerosis Electronically Signed   By: Andreas Newport M.D.   On: 07/16/2017 02:58   Dg Chest 1 View  Result Date: 07/15/2017 CLINICAL DATA:  Nonverbal patient.  History of stroke. EXAM: CHEST 1 VIEW COMPARISON:  07/07/2016 FINDINGS: Cardiomediastinal silhouette is normal. Mediastinal contours appear intact. Calcific atherosclerotic disease and tortuosity of the aorta. There is no evidence of focal airspace consolidation, pleural effusion or pneumothorax. Osseous structures are without acute abnormality. Soft tissues are grossly normal. IMPRESSION: No active disease. Calcific atherosclerotic disease and tortuosity of the aorta. Electronically Signed   By: Fidela Salisbury M.D.   On: 07/15/2017 19:26   Ct Head Wo Contrast  Result Date: 07/16/2017 CLINICAL DATA:  Altered mental status and epistaxis. History of cerebral infarction. EXAM: CT HEAD WITHOUT CONTRAST CT MAXILLOFACIAL WITHOUT CONTRAST TECHNIQUE: Multidetector CT imaging of the head and maxillofacial structures were performed using the standard protocol without intravenous contrast. Multiplanar CT image reconstructions of the maxillofacial structures were also generated. COMPARISON:  CT of the head on 07/07/2016 FINDINGS: CT HEAD FINDINGS Brain: Stable all left-sided MCA territory infarct. The brain demonstrates no evidence of hemorrhage, infarction, edema, mass effect, extra-axial fluid collection, hydrocephalus or mass lesion. The skull is unremarkable. Vascular: No hyperdense vessel or unexpected calcification. Skull: Normal. Negative for fracture or focal lesion. Other: None. CT MAXILLOFACIAL FINDINGS Osseous: No maxillofacial fracture or mandibular dislocation. No destructive process. 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 Orbits: Negative. No traumatic or inflammatory finding. Sinuses: Clear. Soft tissues: No visualized soft tissue masses or enlarged lymph nodes. The visualized airway is normally patent. IMPRESSION: 1. 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 No acute findings by head CT. 2. No acute findings by a maxillofacial CT. 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 Electronically Signed   By: Aletta Edouard M.D.   On: 07/16/2017 10:35   Ct Maxillofacial Wo Contrast  Result Date: 07/16/2017 CLINICAL DATA:  Altered mental status and epistaxis. History of cerebral infarction. EXAM: CT HEAD WITHOUT CONTRAST CT MAXILLOFACIAL WITHOUT CONTRAST TECHNIQUE: Multidetector CT imaging of the head and maxillofacial structures were performed using the standard protocol without intravenous contrast. Multiplanar CT image reconstructions of the maxillofacial structures were also generated. COMPARISON:  CT of the head on 07/07/2016 FINDINGS: CT HEAD FINDINGS Brain: Stable all left-sided MCA territory infarct. The brain demonstrates no evidence of hemorrhage, infarction, edema, mass effect, extra-axial fluid collection, hydrocephalus or mass lesion. The  skull is unremarkable. Vascular: No hyperdense vessel or unexpected calcification. Skull: Normal. Negative for fracture or focal lesion. Other: None. CT MAXILLOFACIAL FINDINGS Osseous: No maxillofacial fracture or mandibular dislocation. No destructive process. 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 Orbits: Negative. No traumatic or inflammatory finding. Sinuses: Clear. Soft tissues: No visualized soft tissue masses or enlarged lymph nodes. The visualized airway is normally patent. IMPRESSION: 1. Stable  evidence of old left-sided MCA territory cerebral infarction. No acute findings by head CT. 2. No acute findings by a maxillofacial CT. 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 Electronically Signed   By: Aletta Edouard M.D.   On: 07/16/2017 10:35   Review of Systems  Unable to perform ROS: Patient nonverbal   Blood pressure (!) 147/74, pulse 80, temperature 98.3 F (36.8 C), temperature source Temporal, resp. rate 15, weight 72.6 kg (160 lb), SpO2 99 %. Physical Exam  Constitutional: Vital signs are normal. He appears listless. He appears cachectic.  Non-toxic appearance. He has a sickly appearance. He appears distressed.  HENT:  Head: Normocephalic and atraumatic.  Cardiovascular: Normal rate and regular rhythm.   Respiratory: Breath sounds normal.  GI: Soft. He exhibits distension. He exhibits no mass. There is tenderness. There is guarding. There is no rebound.  Neurological: He appears listless.  Skin: Skin is warm and dry.   Assessment/Plan: 1) Melena withy mild anemia: no change in hemoglobin after adequate hydration. Considering the patient's co-morbidities and would hold off on performing any and discussed endoscopic procedures at this time. We will continue to monitor his CBCs serially and make further recommendations as needed. In free with IV Protonix for now 2) Chronic constipation with fecal impaction a significantly dilated rectum noted on the CT scan done today; he will need disimpaction and possibly enemas as well.  3) UTI with suspected sepsis on Meropenem.  4) AKI with CKD III-marked urinary retention with the dilated bladder and bilateral hydronephrosis.  5) AODM.  6) HTN.  7) History of spastic hemiplegia s/p CVA with aphasia. 8) Hypokalemia hyperchloremia hypernatremia with metabolic acidosis-multifactorial.Gilbert Lewis  07/16/2017, 12:01 PM

## 2017-07-16 NOTE — Progress Notes (Signed)
   07/16/17 1600  SLP Visit Information  SLP Received On 07/16/17  Reason Eval/Treat Not Completed Other (comment). Swallow orders received. Pt currently in ED; per charting is being admitted with GI consult for rectal bleed. Will hold swallow evaluation today, follow up next date.   Rondel Baton, [place], Corporate investment banker Pathologist [phone]

## 2017-07-16 NOTE — Progress Notes (Signed)
[facility] Progress Note  Patient: Gilbert Lewis ZOX:[id number]   PCP: Patient, No Pcp Per DOB: December 06, 1945   DOA: 07/15/2017   DOS: 07/16/2017   Date of Service: the patient was seen and examined on 07/16/2017  Subjective: Complains about headache. No nausea no vomiting. 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  Brief hospital course: Pt. with PMH of CVA, hemiplegia, aphasia, type II DM, HTN, DVT; admitted on 07/15/2017, presented with complaint of confusion and poor by mouth intake, was found to have hyponatremia, acute kidney injury, suspected sepsis. Currently further plan is continue his IV antibiotics and IV fluids.  Assessment and Plan: 1. Suspected sepsis with UTI. Patient has history of ESBL UTI. Leukocytosis present. Tachycardia. Patient was started on IV meropenem based on prior sensitivities. Monitor on culture. Blood cultures negative at 24 hours.  2. GI bleed. Patient had some melena in the ER. Despite aggressive IV hydration, his H&H is relatively stable. No further GI bleeding reported. Patient was started on IV Protonix infusion as well as every 12 hours injection. Currently I will transition him to every 12 hours injection. Monitor CBC. GI has been consulted, continue stepdown monitoring.  3. Acute kidney injury. CKD stage III Hypokalemia. Hyper chloremia. Hypernatremia. Metabolic acidosis. Lactic acid normal. Electrolyte abnormality is more likely due to poor by mouth intake and dehydration. We'll correct slowly with D5 fluids. Patient was initially given D5 half normal saline but avoiding that due to hyperchloremia. Renal function with serum creatinine is actually improving as well as BUN. Monitor every 4 hours lab for now. Avoid nephrotoxic medications.  4. Type 2 diabetes mellitus. Hemoglobin A1c pending. On sensitive sliding scale every 4 hours.  5. Essential hypertension. Blood pressure medications are currently on hold. Monitor.  6. Acute  urinary retention. Likely contributing to acute kidney injury. Foley catheter inserted further attention. Patient will benefit from urology follow-up.  7. Right lower leg edema. Prior DVT was 2 years ago. Follow up on Doppler.  8. History of seizure. Patient was on oral Keppra, currently on IV. Monitor.  9. Goals of care discussion. Earlier provider discussed with family regarding goals of care. Family at this point would like to avoid overaggressive intervention but agreeable to endoscopy. Family would also be interested in possibility starting tube feeding.   10. Black color deposits on oral mucosa. Headache. CT head and CT maxillofacial performed. Unremarkable. Oral care for now. Patient remains nothing by mouth for now.  Diet: NPO DVT Prophylaxis:mechanical compression device  Advance goals of care discussion: full code  Family Communication: no family was present at bedside, at the time of interview.  Disposition:  Discharge to be determined. .  Consultants: gastroenterology  Procedures: none  Antibiotics: Anti-infectives    Start     Dose/Rate Route Frequency Ordered Stop   07/16/17 0800  meropenem (MERREM) 1 g in sodium chloride 0.9 % 100 mL IVPB     1 g 200 mL/hr over 30 Minutes Intravenous Every 12 hours 07/16/17 0728     07/15/17 2015  vancomycin (VANCOCIN) IVPB 1000 mg/200 mL premix     1,000 mg 200 mL/hr over 60 Minutes Intravenous  Once 07/15/17 2005 07/15/17 2201   07/15/17 2015  piperacillin-tazobactam (ZOSYN) IVPB 3.375 g     3.375 g 100 mL/hr over 30 Minutes Intravenous  Once 07/15/17 2005 07/15/17 2118       Objective: Physical Exam: Vitals:   07/16/17 1130 07/16/17 1145 07/16/17 1300 07/16/17 1558  BP: 137/77 (!) 147/74 139/76 138/81  Pulse: (!) 57 80 80 88  Resp: (!) Temp:      TempSrc:      SpO2: 98% 99% 97% 96%  Weight:        Intake/Output Summary (Last 24 hours) at 07/16/17 1602 Last data filed at 07/15/17 2348   Gross per 24 hour  Intake             3450 ml  Output              800 ml  Net             2650 ml   Filed Weights   07/15/17 1838  Weight: 72.6 kg (160 lb)   General: Alert, Awake. Appear in mild distress, affect difficult to assess Eyes: PERRL, Conjunctiva normal ENT: Oral Mucosa black deposit Neck: difficult to assess JVD, no Abnormal Mass Or lumps Cardiovascular: S1 and S2 Present, no Murmur, Peripheral Pulses Present Respiratory: normal respiratory effort, Bilateral Air entry equal and Decreased, no use of accessory muscle, Clear to Auscultation, no Crackles, no wheezes Abdomen: Bowel Sound present, Soft and no tenderness, no hernia Skin: no redness, no Rash, no induration Extremities: no Pedal edema, no calf tenderness Neurologic: Grossly no focal neuro deficit. Bilaterally Equal motor strength  Data Reviewed: CBC:  Recent Labs Lab 07/15/17 1847 07/16/17 0343 07/16/17 1437  WBC 14.2* 12.3* 11.3*  NEUTROABS 10.5*  --   --   HGB 12.6* 10.7* 10.6*  HCT 40.9 35.5* 35.4*  MCV 87.6 88.3 88.7  PLT 289 218 196   Basic Metabolic Panel:  Recent Labs Lab 07/15/17 1847 07/15/17 2124 07/16/17 0343 07/16/17 1437  NA 159* 158* 158* 158*  K 3.2* 3.1* 3.4* 3.6  CL 129* 129* >130* >130*  CO2 22 21* 19* 23  GLUCOSE 164* 184* 123* 146*  BUN 105* 97* 85* 68*  CREATININE 2.44* 2.26* 2.03* 1.83*  CALCIUM 8.6* 7.8* 7.6* 7.8*    Liver Function Tests:  Recent Labs Lab 07/15/17 1847  AST 19  ALT 10*  ALKPHOS 67  BILITOT 1.0  PROT 7.1  ALBUMIN 2.7*    Recent Labs Lab 07/16/17 0343  LIPASE 55*   No results for input(s): AMMONIA in the last 168 hours. Coagulation Profile:  Recent Labs Lab 07/15/17 1847  INR 1.24   Cardiac Enzymes:  Recent Labs Lab 07/15/17 2124  CKTOTAL 78   BNP (last 3 results) No results for input(s): PROBNP in the last 8760 hours. CBG: No results for input(s): GLUCAP in the last 168 hours. Studies: Ct Abdomen Pelvis Wo  Contrast  Result Date: 07/16/2017 CLINICAL DATA:  Lethargic.  Not eating or drinking. EXAM: CT ABDOMEN AND PELVIS WITHOUT CONTRAST TECHNIQUE: Multidetector CT imaging of the abdomen and pelvis was performed following the standard protocol without IV contrast. COMPARISON:  06/19/2017 FINDINGS: Lower chest: Mild linear atelectatic appearing opacities in the lung bases. No confluent consolidation. No effusions. Hepatobiliary: Subtle low-attenuation liver lesions, seen to better advantage on the contrast enhanced study of 06/19/2017. Multiple calculi within the gallbladder, measuring up to 1.5 cm. No bile duct dilatation. Pancreas: Unremarkable unenhanced appearances of the pancreas. Spleen: Unremarkable unenhanced appearances of the spleen. Adrenals/Urinary Tract: Both adrenals are normal. There is marked distention of the urinary bladder, up to several cm above the level of the umbilicus. There is prominent ureteral dilatation and hydronephrosis bilaterally. This is new from 06/19/2017. Collecting system calculi are again evident bilaterally. No evidence of ureteral calculi. Low-attenuation right renal lesions  are again evident, unchanged and likely cysts. Stomach/Bowel: Marked rectal distention with stool, possible fecal impaction. No focal bowel lesion. No focal bowel inflammation. No extraluminal gas. Vascular/Lymphatic: The abdominal aorta is normal in caliber with moderate atherosclerotic calcification. No adenopathy in the abdomen or pelvis. Reproductive: Unremarkable Musculoskeletal: No significant skeletal lesion. Bridging anterior osteophytes of the lumbar spine IMPRESSION: 1. Marked urinary bladder distention, well above the level of the umbilicus. Associated ureteral dilatation and hydronephrosis bilaterally. 2. Marked rectal distention with stool, possible fecal impaction. 3. Bilateral nephrolithiasis, nonobstructing. 4. Low-attenuation liver lesions, indeterminate. Unchanged from 06/19/17 5.  Cholelithiasis. 6. Aortic atherosclerosis Electronically Signed   By: Ellery Plunk M.D.   On: 07/16/2017 02:58   Dg Chest 1 View  Result Date: 07/15/2017 CLINICAL DATA:  Nonverbal patient.  History of stroke. EXAM: CHEST 1 VIEW COMPARISON:  07/07/2016 FINDINGS: Cardiomediastinal silhouette is normal. Mediastinal contours appear intact. Calcific atherosclerotic disease and tortuosity of the aorta. There is no evidence of focal airspace consolidation, pleural effusion or pneumothorax. Osseous structures are without acute abnormality. Soft tissues are grossly normal. IMPRESSION: No active disease. Calcific atherosclerotic disease and tortuosity of the aorta. Electronically Signed   By: Ted Mcalpine M.D.   On: 07/15/2017 19:26   Ct Head Wo Contrast  Result Date: 07/16/2017 CLINICAL DATA:  Altered mental status and epistaxis. History of cerebral infarction. EXAM: CT HEAD WITHOUT CONTRAST CT MAXILLOFACIAL WITHOUT CONTRAST TECHNIQUE: Multidetector CT imaging of the head and maxillofacial structures were performed using the standard protocol without intravenous contrast. Multiplanar CT image reconstructions of the maxillofacial structures were also generated. COMPARISON:  CT of the head on 07/07/2016 FINDINGS: CT HEAD FINDINGS Brain: Stable all left-sided MCA territory infarct. The brain demonstrates no evidence of hemorrhage, infarction, edema, mass effect, extra-axial fluid collection, hydrocephalus or mass lesion. The skull is unremarkable. Vascular: No hyperdense vessel or unexpected calcification. Skull: Normal. Negative for fracture or focal lesion. Other: None. CT MAXILLOFACIAL FINDINGS Osseous: No maxillofacial fracture or mandibular dislocation. No destructive process. 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 Orbits: Negative. No traumatic or inflammatory finding. Sinuses: Clear. Soft  tissues: No visualized soft tissue masses or enlarged lymph nodes. The visualized airway is normally patent. IMPRESSION: 1. 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 No acute findings by head CT. 2. No acute findings by a maxillofacial CT. 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 Electronically Signed   By: Irish Lack M.D.   On: 07/16/2017 10:35   Ct Maxillofacial Wo Contrast  Result Date: 07/16/2017 CLINICAL DATA:  Altered mental status and epistaxis. History of cerebral infarction. EXAM: CT HEAD WITHOUT CONTRAST CT MAXILLOFACIAL WITHOUT CONTRAST TECHNIQUE: Multidetector CT imaging of the head and maxillofacial structures were performed using the standard protocol without intravenous contrast. Multiplanar CT image reconstructions of the maxillofacial structures were also generated. COMPARISON:  CT of the head on 07/07/2016 FINDINGS: CT HEAD FINDINGS Brain: Stable all left-sided MCA territory infarct. The brain demonstrates no evidence of hemorrhage, infarction, edema, mass effect, extra-axial fluid collection, hydrocephalus or mass lesion. The skull is unremarkable. Vascular: No hyperdense vessel or unexpected calcification. Skull: Normal. Negative for fracture or focal lesion. Other: None. CT MAXILLOFACIAL FINDINGS Osseous: No maxillofacial fracture or mandibular dislocation. No destructive process. 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 Orbits: Negative. No traumatic or inflammatory finding. Sinuses: Clear. Soft tissues: No visualized soft tissue masses or enlarged lymph nodes. The visualized airway is normally patent. IMPRESSION: 1. 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 No acute findings by head CT. 2. No acute findings by a maxillofacial CT. There is ankylosis anteriorly of cervical  vertebral bodies by a large bridging osteophyte. Electronically Signed   By: Irish Lack M.D.   On: 07/16/2017 10:35    Scheduled Meds: . finasteride  5 mg Oral QPM  . folic acid  1 mg Intravenous Daily  . insulin aspart  0-9 Units Subcutaneous Q4H  . [START ON 07/19/2017] pantoprazole  40 mg Intravenous Q12H  . tamsulosin  0.4 mg Oral QPC supper  . thiamine  100 mg Intravenous Daily  . ursodiol  600 mg Oral BID   Continuous Infusions: . dextrose    . levETIRAcetam Stopped (07/16/17 0711)  . meropenem (MERREM) IV 1 g (07/16/17 1140)   PRN Meds: acetaminophen **OR** acetaminophen, ondansetron **OR** ondansetron (ZOFRAN) IV  Time spent: 35 minutes  Author: Lynden Oxford, MD [facility] Pager: [phone] 07/16/2017 4:02 PM  If 7PM-7AM, please contact night-coverage at [email or web address], password [facility]

## 2017-07-17 ENCOUNTER — Inpatient Hospital Stay (HOSPITAL_COMMUNITY): Payer: Medicare Other

## 2017-07-17 DIAGNOSIS — Z86718 Personal history of other venous thrombosis and embolism: Secondary | ICD-10-CM

## 2017-07-17 DIAGNOSIS — K921 Melena: Secondary | ICD-10-CM

## 2017-07-17 LAB — TROPONIN I: Troponin I: <0.03 ng/mL (ref <0.03)

## 2017-07-17 LAB — RENAL FUNCTION PANEL
ANION GAP: 5 (ref 5–15)
Albumin: 2.4 g/dL — ABNORMAL LOW (ref 3.5–5.0)
Albumin: 2.4 g/dL — ABNORMAL LOW (ref 3.5–5.0)
Anion gap: 7 (ref 5–15)
BUN: 48 mg/dL — ABNORMAL HIGH (ref 6–20)
BUN: 45 mg/dL — ABNORMAL HIGH (ref 6–20)
CALCIUM: 8 mg/dL — AB (ref 8.9–10.3)
CHLORIDE: 130 mmol/L — AB (ref 101–111)
CO2: 21 mmol/L — AB (ref 22–32)
CO2: 17 mmol/L — ABNORMAL LOW (ref 22–32)
Calcium: 8.2 mg/dL — ABNORMAL LOW (ref 8.9–10.3)
Chloride: 128 mmol/L — ABNORMAL HIGH (ref 101–111)
Creatinine, Ser: 1.5 mg/dL — ABNORMAL HIGH (ref 0.61–1.24)
Creatinine, Ser: 1.45 mg/dL — ABNORMAL HIGH (ref 0.61–1.24)
GFR calc Af Amer: 53 mL/min — ABNORMAL LOW (ref >60)
GFR calc non Af Amer: 46 mL/min — ABNORMAL LOW (ref >60)
GFR, EST AFRICAN AMERICAN: 55 mL/min — AB (ref >60)
GFR, EST NON AFRICAN AMERICAN: 48 mL/min — AB (ref >60)
GLUCOSE: 132 mg/dL — AB (ref 65–99)
Glucose, Bld: 130 mg/dL — ABNORMAL HIGH (ref 65–99)
POTASSIUM: 3.3 mmol/L — AB (ref 3.5–5.1)
POTASSIUM: 4.1 mmol/L (ref 3.5–5.1)
Phosphorus: 1.9 mg/dL — ABNORMAL LOW (ref 2.5–4.6)
Phosphorus: 2.3 mg/dL — ABNORMAL LOW (ref 2.5–4.6)
SODIUM: 154 mmol/L — AB (ref 135–145)
Sodium: 154 mmol/L — ABNORMAL HIGH (ref 135–145)

## 2017-07-17 LAB — COMPREHENSIVE METABOLIC PANEL
ALT: 9 U/L — ABNORMAL LOW (ref 17–63)
AST: 18 U/L (ref 15–41)
Albumin: 2.4 g/dL — ABNORMAL LOW (ref 3.5–5.0)
Alkaline Phosphatase: 65 U/L (ref 38–126)
BUN: 56 mg/dL — ABNORMAL HIGH (ref 6–20)
CO2: 19 mmol/L — AB (ref 22–32)
Calcium: 7.9 mg/dL — ABNORMAL LOW (ref 8.9–10.3)
Chloride: >130 mmol/L (ref 101–111)
Creatinine, Ser: 1.64 mg/dL — ABNORMAL HIGH (ref 0.61–1.24)
GFR calc Af Amer: 48 mL/min — ABNORMAL LOW (ref >60)
GFR, EST NON AFRICAN AMERICAN: 41 mL/min — AB (ref >60)
GLUCOSE: 111 mg/dL — AB (ref 65–99)
Potassium: 3.7 mmol/L (ref 3.5–5.1)
SODIUM: 157 mmol/L — AB (ref 135–145)
TOTAL PROTEIN: 6 g/dL — AB (ref 6.5–8.1)
Total Bilirubin: 0.9 mg/dL (ref 0.3–1.2)

## 2017-07-17 LAB — PHOSPHORUS: PHOSPHORUS: 2.1 mg/dL — AB (ref 2.5–4.6)

## 2017-07-17 LAB — TSH: TSH: 3.036 u[IU]/mL (ref 0.350–4.500)

## 2017-07-17 LAB — GLUCOSE, CAPILLARY
GLUCOSE-CAPILLARY: 103 mg/dL — AB (ref 65–99)
GLUCOSE-CAPILLARY: 129 mg/dL — AB (ref 65–99)
GLUCOSE-CAPILLARY: 114 mg/dL — AB (ref 65–99)
GLUCOSE-CAPILLARY: 117 mg/dL — AB (ref 65–99)
Glucose-Capillary: 125 mg/dL — ABNORMAL HIGH (ref 65–99)

## 2017-07-17 LAB — LACTIC ACID, PLASMA: LACTIC ACID, VENOUS: 0.9 mmol/L (ref 0.5–1.9)

## 2017-07-17 LAB — PROTIME-INR
INR: 1.24
Prothrombin Time: 15.5 seconds — ABNORMAL HIGH (ref 11.4–15.2)

## 2017-07-17 LAB — MAGNESIUM: Magnesium: 2 mg/dL (ref 1.7–2.4)

## 2017-07-17 MED ORDER — NYSTATIN 100000 UNIT/ML MT SUSP
5.0000 mL | Freq: Four times a day (QID) | OROMUCOSAL | Status: DC
  Administered 2017-07-17 – 2017-07-23 (×22): 500000 [IU] via ORAL
  Filled 2017-07-17 (×21): qty 5

## 2017-07-17 MED ORDER — METHOCARBAMOL 1000 MG/10ML IJ SOLN
500.0000 mg | Freq: Three times a day (TID) | INTRAVENOUS | Status: DC | PRN
  Administered 2017-07-18 – 2017-07-19 (×3): 500 mg via INTRAVENOUS
  Filled 2017-07-17 (×5): qty 5

## 2017-07-17 MED ORDER — CHLORHEXIDINE GLUCONATE 0.12 % MT SOLN
15.0000 mL | Freq: Two times a day (BID) | OROMUCOSAL | Status: DC
  Administered 2017-07-18 – 2017-07-23 (×11): 15 mL via OROMUCOSAL
  Filled 2017-07-17 (×9): qty 15

## 2017-07-17 MED ORDER — LORAZEPAM 2 MG/ML IJ SOLN
1.0000 mg | Freq: Once | INTRAMUSCULAR | Status: AC
  Administered 2017-07-17: 1 mg via INTRAVENOUS
  Filled 2017-07-17: qty 1

## 2017-07-17 MED ORDER — FENTANYL CITRATE (PF) 100 MCG/2ML IJ SOLN
12.5000 ug | Freq: Four times a day (QID) | INTRAMUSCULAR | Status: DC | PRN
  Administered 2017-07-17 – 2017-07-18 (×3): 12.5 ug via INTRAVENOUS
  Filled 2017-07-17 (×3): qty 2

## 2017-07-17 MED ORDER — BLISTEX MEDICATED EX OINT
1.0000 "application " | TOPICAL_OINTMENT | CUTANEOUS | Status: DC | PRN
  Administered 2017-07-18: 1 via TOPICAL
  Filled 2017-07-17: qty 6.3

## 2017-07-17 MED ORDER — HYDROCORTISONE 2.5 % RE CREA
1.0000 "application " | TOPICAL_CREAM | Freq: Four times a day (QID) | RECTAL | Status: DC | PRN
  Administered 2017-07-18: 1 via TOPICAL
  Filled 2017-07-17: qty 28.35

## 2017-07-17 MED ORDER — ORAL CARE MOUTH RINSE
15.0000 mL | Freq: Two times a day (BID) | OROMUCOSAL | Status: DC
  Administered 2017-07-17: 15 mL via OROMUCOSAL

## 2017-07-17 MED ORDER — MORPHINE SULFATE (PF) 2 MG/ML IV SOLN
1.0000 mg | INTRAVENOUS | Status: DC | PRN
  Filled 2017-07-17: qty 1

## 2017-07-17 MED ORDER — LORAZEPAM 2 MG/ML IJ SOLN
0.5000 mg | Freq: Once | INTRAMUSCULAR | Status: AC
  Administered 2017-07-17: 0.5 mg via INTRAVENOUS
  Filled 2017-07-17: qty 1

## 2017-07-17 NOTE — Progress Notes (Signed)
[facility] Progress Note  Patient: Gilbert Lewis ZOX:[id number]   PCP: Patient, No Pcp Per DOB: 1946/04/05   DOA: 07/15/2017   DOS: 07/17/2017   Date of Service: the patient was seen and examined on 07/17/2017  Subjective: entation better. Denies any acute complaint. No acute events overnight. Has chronic pain in the leg which is worsening.  Brief hospital course: Pt. with PMH of CVA, hemiplegia, aphasia, type II DM, HTN, DVT; admitted on 07/15/2017, presented with complaint of confusion and poor by mouth intake, was found to have hyponatremia, acute kidney injury, suspected sepsis. Currently further plan is continue his IV antibiotics and IV fluids.  Assessment and Plan: 1. Suspected sepsis with UTI. Patient has history of ESBL UTI. Urine culture is growing enterococcus faecalis, blood culture negative. Leukocytosis present. Tachycardia. Patient was started on IV meropenem based on prior sensitivities. Continue for now since the patient clinically is getting better.  2. GI bleed. Patient had some melena in the ER. Despite aggressive IV hydration, his H&H is relatively stable. No further GI bleeding reported. Patient was started on IV Protonix infusion as well as every 12 hours injection. Currently I will transition him to every 12 hours injection. Monitor CBC. GI has been consulted, continue stepdown monitoring.  3. Acute kidney injury. CKD stage III Hypokalemia. Hyper chloremia. Hypernatremia. Metabolic acidosis. Lactic acid normal. Electrolyte abnormality is more likely due to poor by mouth intake and dehydration. We'll correct slowly with D5 fluids. Patient was initially given D5 half normal saline but avoiding that due to hyperchloremia. Renal function with serum creatinine is actually improving as well as BUN. Monitor every 8 hours lab for now. Avoid nephrotoxic medications.  4. Type 2 diabetes mellitus. Hemoglobin A1c 5.9. On sensitive sliding scale every 4  hours.  5. Essential hypertension. Blood pressure medications are currently on hold. Monitor.  6. Acute urinary retention. Likely contributing to acute kidney injury. Foley catheter inserted further attention. Patient will benefit from urology follow-up.  7. Right lower leg edema. Prior DVT was 2 years ago. Follow up on Doppler.  8. History of seizure. Patient was on oral Keppra, currently on IV. Monitor.  9. Goals of care discussion. Earlier provider discussed with family regarding goals of care. Family at this point would like to avoid overaggressive intervention but agreeable to endoscopy. Family would also be interested in possibility starting tube feeding.  10. Black color deposits on oral mucosa. Headache. CT head and CT maxillofacial performed. Unremarkable. Oral care for now. Patient remains nothing by mouth for now.  11. dysphagia due to prior stroke. Patient has aphasia as well as dysphagia due to prior stroke. Was able to eat prior to this admission. Appreciate speech therapy input. Expectation is improvement in patient's ability to tolerate oral diet after more speech therapy. Do not see that the patient will require peg tube placement. May Require Temporary Cortrack If transition to activities required, per GI and would like to monitor the patient on "crack before deciding to do that.  Diet: NPO DVT Prophylaxis:mechanical compression device  Advance goals of care discussion: full code  Family Communication: no family was present at bedside, at the time of interview.  Disposition:  Discharge to be determined. .  Consultants: gastroenterology  Procedures: none  Antibiotics: Anti-infectives    Start     Dose/Rate Route Frequency Ordered Stop   07/16/17 0800  meropenem (MERREM) 1 g in sodium chloride 0.9 % 100 mL IVPB     1 g 200 mL/hr over 30  Minutes Intravenous Every 12 hours 07/16/17 0728     07/15/17 2015  vancomycin (VANCOCIN) IVPB 1000 mg/200 mL  premix     1,000 mg 200 mL/hr over 60 Minutes Intravenous  Once 07/15/17 2005 07/15/17 2201   07/15/17 2015  piperacillin-tazobactam (ZOSYN) IVPB 3.375 g     3.375 g 100 mL/hr over 30 Minutes Intravenous  Once 07/15/17 2005 07/15/17 2118       Objective: Physical Exam: Vitals:   07/17/17 0313 07/17/17 0753 07/17/17 1120 07/17/17 1614  BP: (!) 117/96  (!) 136/98 (!) 149/81  Pulse: 63  88 88  Resp: (!) 21  (!) 30   Temp: 98.7 F (37.1 C) 98.8 F (37.1 C) 97.7 F (36.5 C) 99.2 F (37.3 C)  TempSrc: Axillary Axillary Axillary Axillary  SpO2: 97%  100%   Weight:      Height:        Intake/Output Summary (Last 24 hours) at 07/17/17 1849 Last data filed at 07/17/17 1700  Gross per 24 hour  Intake          1585.84 ml  Output             3450 ml  Net         -1864.16 ml   Filed Weights   07/15/17 1838 07/16/17 2016  Weight: 72.6 kg (160 lb) 68.9 kg (151 lb 14.4 oz)   General: Alert, Awake. Appear in mild distress, affect difficult to assess Eyes: PERRL, Conjunctiva normal ENT: Oral Mucosa black deposit Neck: difficult to assess JVD, no Abnormal Mass Or lumps Cardiovascular: S1 and S2 Present, no Murmur, Peripheral Pulses Present Respiratory: normal respiratory effort, Bilateral Air entry equal and Decreased, no use of accessory muscle, Clear to Auscultation, no Crackles, no wheezes Abdomen: Bowel Sound present, Soft and no tenderness, no hernia Skin: no redness, no Rash, no induration Extremities: no Pedal edema, no calf tenderness Neurologic: Grossly no focal neuro deficit. Bilaterally Equal motor strength  Data Reviewed: CBC:  Recent Labs Lab 07/15/17 1847 07/16/17 0343 07/16/17 1437 07/16/17 2052  WBC 14.2* 12.3* 11.3* 12.7*  NEUTROABS 10.5*  --   --   --   HGB 12.6* 10.7* 10.6* 10.7*  HCT 40.9 35.5* 35.4* 35.2*  MCV 87.6 88.3 88.7 88.4  PLT 289 218 196 205   Basic Metabolic Panel:  Recent Labs Lab 07/16/17 0343 07/16/17 1437 07/16/17 2052  07/17/17 0336 07/17/17 1239  NA 158* 158* 154* 157* 154*  K 3.4* 3.6 3.2* 3.7 3.3*  CL >130* >130* 129* >130* 128*  CO2 19* 23 19* 19* 21*  GLUCOSE 123* 146* 124* 111* 132*  BUN 85* 68* 63* 56* 48*  CREATININE 2.03* 1.83* 1.70* 1.64* 1.50*  CALCIUM 7.6* 7.8* 7.7* 7.9* 8.0*  MG  --   --  1.6* 2.0  --   PHOS  --   --  2.1* 2.1* 1.9*    Liver Function Tests:  Recent Labs Lab 07/15/17 1847 07/16/17 2052 07/17/17 0336 07/17/17 1239  AST 19  --  18  --   ALT 10*  --  9*  --   ALKPHOS 67  --  65  --   BILITOT 1.0  --  0.9  --   PROT 7.1  --  6.0*  --   ALBUMIN 2.7* 2.4* 2.4* 2.4*    Recent Labs Lab 07/16/17 0343  LIPASE 55*   No results for input(s): AMMONIA in the last 168 hours. Coagulation Profile:  Recent Labs Lab 07/15/17 1847 07/17/17  1610  INR 1.24 1.24   Cardiac Enzymes:  Recent Labs Lab 07/15/17 2124 07/16/17 2052 07/17/17 0336  CKTOTAL 78  --   --   TROPONINI  --  <0.03 <0.03   BNP (last 3 results) No results for input(s): PROBNP in the last 8760 hours. CBG:  Recent Labs Lab 07/16/17 2031 07/16/17 2302 07/17/17 0752 07/17/17 1119 07/17/17 1626  GLUCAP 108* 119* 103* 125* 129*   Studies: No results found.  Scheduled Meds: . chlorhexidine  15 mL Mouth Rinse BID  . chlorhexidine  15 mL Mouth Rinse BID  . finasteride  5 mg Oral QPM  . folic acid  1 mg Intravenous Daily  . insulin aspart  0-9 Units Subcutaneous Q4H  . mouth rinse  15 mL Mouth Rinse q12n4p  . mouth rinse  15 mL Mouth Rinse q12n4p  . nystatin  5 mL Oral QID  . [START ON 07/19/2017] pantoprazole  40 mg Intravenous Q12H  . tamsulosin  0.4 mg Oral QPC supper  . thiamine  100 mg Intravenous Daily  . ursodiol  600 mg Oral BID   Continuous Infusions: . dextrose 75 mL/hr at 07/17/17 0730  . levETIRAcetam Stopped (07/17/17 1139)  . meropenem (MERREM) IV Stopped (07/17/17 1020)  . methocarbamol (ROBAXIN)  IV     PRN Meds: acetaminophen **OR** acetaminophen, fentaNYL  (SUBLIMAZE) injection, lip balm, methocarbamol (ROBAXIN)  IV, ondansetron **OR** ondansetron (ZOFRAN) IV  Time spent: 35 minutes  Author: Lynden Oxford, MD [facility] Pager: [phone] 07/17/2017 6:49 PM  If 7PM-7AM, please contact night-coverage at [email or web address], password [facility]

## 2017-07-17 NOTE — Clinical Social Work Note (Signed)
CSW acknowledges consult that patient is from a facility. Patient is a long-term resident from [facility]. CSW will follow progress and facilitate return to SNF once closer to discharge.  Charlynn Court, CSW [phone]

## 2017-07-17 NOTE — Progress Notes (Addendum)
Initial Nutrition Assessment  DOCUMENTATION CODES:   Non-severe (moderate) malnutrition in context of chronic illness  INTERVENTION:   -If pt continues to require temporary nutrition via alternative means, recommend:  Initiate Jevity 1.2 @ 20 ml/hr via NGT and increase by 10 ml every 4 hours to goal rate of 70 ml/hr.   Tube feeding regimen provides 2016 kcal (100% of needs), 93 grams of protein, and 1356 ml of H2O.   -Liquid MVI daily via tube  NUTRITION DIAGNOSIS:   Malnutrition (Moderate) related to chronic illness (CVA) as evidenced by mild depletion of body fat, mild depletion of muscle mass.  GOAL:   Patient will meet greater than or equal to 90% of their needs  MONITOR:   Diet advancement, Labs, Weight trends, Skin, I & O's  REASON FOR ASSESSMENT:   Consult Assessment of nutrition requirement/status  ASSESSMENT:   Pt. with PMH of CVA, hemiplegia, aphasia, type II DM, HTN, DVT; admitted on 07/15/2017, presented with complaint of confusion and poor by mouth intake, was found to have hyponatremia, acute kidney injury, suspected sepsis.  Pt admitted with suspected sepsis/UTI.   Reviewed BSE from today; per SLP notes will likely not need PEG and prognosis for initiation of PO ad/or instrumental assessment is good, however, recommending short-term alternative means of nutrition and hydration.   Pt unable to provide hx. Intermittently verbalizes "help" or "please". Of note, per H&P, pt has required PEG in past, however, was consuming po's PTA. Suspect intake was inadequate PTA, however, unable to confirm at this time.   Reviewed wt hx; noted pt has experienced a 6.8% wt loss over the past 6 months.   Nutrition-Focused physical exam completed. Findings are mild to moderate fat depletion, mild to moderate muscle depletion, and no edema.   Medications reviewed and inlcude folic acid and vitamin B-1.   Labs reviewed: Na: 157, Phos: 2.1, CBGS: 103-119 (inpatient orders for  glycemic control are 0-9 units insulin aspart every 4 hours).   Diet Order:  Diet NPO time specified  Skin:   (stage 1 sacrum)  Last BM:  07/17/17  Height:   Ht Readings from Last 1 Encounters:  07/16/17  (1.803 m)    Weight:   Wt Readings from Last 1 Encounters:  07/16/17 151 lb 14.4 oz (68.9 kg)    Ideal Body Weight:  78.2 kg  BMI:  Body mass index is 21.19 kg/m.  Estimated Nutritional Needs:   Kcal:  1850-2050  Protein:  90-105 grams  Fluid:  1.8-2.0 L  EDUCATION NEEDS:   Education needs no appropriate at this time  Gennette Shadix A. Mayford Knife, RD, LDN, CDE Pager: [phone] After hours Pager: [phone]

## 2017-07-17 NOTE — Progress Notes (Signed)
Pt's daughter in room voicing concern for left eye droop.  Daughter states "this is new."  This RN asked family when eye droop was first noticed.  Family stated "I noticed it when he came in on the 13th."  This RN then asked the family if they had brought it up prior to now.  Family states "We did not because he had so many other things going on."  Review of chart noted Head CT performed on 10/14.  Linton Flemings, NP notified of family concerns and head ct performed after symptom of left eye drooping developing.  No additional orders received at this time.

## 2017-07-17 NOTE — Progress Notes (Signed)
Pt requesting to get up out of bed.  Educated pt on unable to get oob at this time.  Pt yelling out with c/o pain.  When this RN asked pt if he was in pain pt states "yes."  Offered patient prn medication pt refused.  Attempting to turn patient as pt requesting to be turned and pt grabbed this RN's neck and states loudly "No."  RN asked pt to release grip from neck and pt complied with request.   RN again offered PRN pain medication and pt continues to refuse.

## 2017-07-17 NOTE — Progress Notes (Signed)
CRITICAL VALUE ALERT  Critical Value:  Chloride >130  Date & Time Notied:  10/15 0531  Provider Notified: X. Blount, NP  Orders Received/Actions taken: No orders received

## 2017-07-17 NOTE — Progress Notes (Signed)
Pt yelling out.  This RN enters room to check on patient.  Pt immediately swings left arm at this RN and strikes RN on left arm then proceeds to grab RN's wrist and pulls RN towards patient.  Pt compliant with requests to release wrist.  Donnamarie Poag NP notified.  Order received for 1 MG IV ativan.  Ativan given per order with some relief of symptoms however patient still nods head yes when asked if anxious.  Pt nods head yes when this RN asks if he would feel better if his family were with him.  Pt's daughter Jake Samples called and updated.  Daughter states patient gets combative when family not with patient.  Daughter agreeable to come to bedside to remain with patient in attempts to calm patient's agitation and anxiety.  Pt updated with plan for daughter to come to bedside to stay with patient.

## 2017-07-17 NOTE — Progress Notes (Signed)
[facility] Progress Note    Since last GI note: RN reports two brown stools overnight. No overt bleeding. I met with his daughter in the room today, she had many questions about his condition.  Objective: Vital signs in last 24 hours: Temp:  [98.7 F (37.1 C)-99 F (37.2 C)] 98.7 F (37.1 C) (10/15 0313) Pulse Rate:  [57-147] 63 (10/15 0313) Resp:  [11-22] 21 (10/15 0313) BP: (117-147)/(67-96) 117/96 (10/15 0313) SpO2:  [96 %-99 %] 97 % (10/15 0313) Weight:  [151 lb 14.4 oz (68.9 kg)] 151 lb 14.4 oz (68.9 kg) (10/14 2016) Last BM Date: 07/16/17 General: alert and oriented times ? (aphasic and very weak) Heart: regular rate and rythm Abdomen: soft, seems slightly tender Left abdomen, non-distended, normal bowel sounds Brown stool smears.  Lab Results:  Recent Labs  07/16/17 0343 07/16/17 1437 07/16/17 2052  WBC 12.3* 11.3* 12.7*  HGB 10.7* 10.6* 10.7*  PLT 218 196 205  MCV 88.3 88.7 88.4    Recent Labs  07/16/17 1437 07/16/17 2052 07/17/17 0336  NA 158* 154* 157*  K 3.6 3.2* 3.7  CL >130* 129* >130*  CO2 23 19* 19*  GLUCOSE 146* 124* 111*  BUN 68* 63* 56*  CREATININE 1.83* 1.70* 1.64*  CALCIUM 7.8* 7.7* 7.9*    Recent Labs  07/15/17 1847 07/16/17 2052 07/17/17 0336  PROT 7.1  --  6.0*  ALBUMIN 2.7* 2.4* 2.4*  AST 19  --  18  ALT 10*  --  9*  ALKPHOS 67  --  65  BILITOT 1.0  --  0.9    Recent Labs  07/15/17 1847 07/17/17 0336  INR 1.24 1.24     Studies/Results: Ct Abdomen Pelvis Wo Contrast  Result Date: 07/16/2017 CLINICAL DATA:  Lethargic.  Not eating or drinking. EXAM: CT ABDOMEN AND PELVIS WITHOUT CONTRAST TECHNIQUE: Multidetector CT imaging of the abdomen and pelvis was performed following the standard protocol without IV contrast. COMPARISON:  06/19/2017 FINDINGS: Lower chest: Mild linear atelectatic appearing opacities in the lung bases. No confluent consolidation. No effusions. Hepatobiliary: Subtle low-attenuation  liver lesions, seen to better advantage on the contrast enhanced study of 06/19/2017. Multiple calculi within the gallbladder, measuring up to 1.5 cm. No bile duct dilatation. Pancreas: Unremarkable unenhanced appearances of the pancreas. Spleen: Unremarkable unenhanced appearances of the spleen. Adrenals/Urinary Tract: Both adrenals are normal. There is marked distention of the urinary bladder, up to several cm above the level of the umbilicus. There is prominent ureteral dilatation and hydronephrosis bilaterally. This is new from 06/19/2017. Collecting system calculi are again evident bilaterally. No evidence of ureteral calculi. Low-attenuation right renal lesions are again evident, unchanged and likely cysts. Stomach/Bowel: Marked rectal distention with stool, possible fecal impaction. No focal bowel lesion. No focal bowel inflammation. No extraluminal gas. Vascular/Lymphatic: The abdominal aorta is normal in caliber with moderate atherosclerotic calcification. No adenopathy in the abdomen or pelvis. Reproductive: Unremarkable Musculoskeletal: No significant skeletal lesion. Bridging anterior osteophytes of the lumbar spine IMPRESSION: 1. Marked urinary bladder distention, well above the level of the umbilicus. Associated ureteral dilatation and hydronephrosis bilaterally. 2. Marked rectal distention with stool, possible fecal impaction. 3. Bilateral nephrolithiasis, nonobstructing. 4. Low-attenuation liver lesions, indeterminate. Unchanged from 06/19/17 5. Cholelithiasis. 6. Aortic atherosclerosis Electronically Signed   By: Andreas Newport M.D.   On: 07/16/2017 02:58   Dg Chest 1 View  Result Date: 07/15/2017 CLINICAL DATA:  Nonverbal patient.  History of stroke. EXAM: CHEST 1 VIEW COMPARISON:  07/07/2016 FINDINGS: Cardiomediastinal silhouette  is normal. Mediastinal contours appear intact. Calcific atherosclerotic disease and tortuosity of the aorta. There is no evidence of focal airspace consolidation,  pleural effusion or pneumothorax. Osseous structures are without acute abnormality. Soft tissues are grossly normal. IMPRESSION: No active disease. Calcific atherosclerotic disease and tortuosity of the aorta. Electronically Signed   By: Fidela Salisbury M.D.   On: 07/15/2017 19:26   Ct Head Wo Contrast  Result Date: 07/16/2017 CLINICAL DATA:  Altered mental status and epistaxis. History of cerebral infarction. EXAM: CT HEAD WITHOUT CONTRAST CT MAXILLOFACIAL WITHOUT CONTRAST TECHNIQUE: Multidetector CT imaging of the head and maxillofacial structures were performed using the standard protocol without intravenous contrast. Multiplanar CT image reconstructions of the maxillofacial structures were also generated. COMPARISON:  CT of the head on 07/07/2016 FINDINGS: CT HEAD FINDINGS Brain: Stable all left-sided MCA territory infarct. The brain demonstrates no evidence of hemorrhage, infarction, edema, mass effect, extra-axial fluid collection, hydrocephalus or mass lesion. The skull is unremarkable. Vascular: No hyperdense vessel or unexpected calcification. Skull: Normal. Negative for fracture or focal lesion. Other: None. CT MAXILLOFACIAL FINDINGS Osseous: No maxillofacial fracture or mandibular dislocation. No destructive process. 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 Orbits: Negative. No traumatic or inflammatory finding. Sinuses: Clear. Soft tissues: No visualized soft tissue masses or enlarged lymph nodes. The visualized airway is normally patent. IMPRESSION: 1. 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 No acute findings by head CT. 2. No acute findings by a maxillofacial CT. 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 Electronically Signed   By: Aletta Edouard M.D.   On: 07/16/2017 10:35   Ct Maxillofacial Wo  Contrast  Result Date: 07/16/2017 CLINICAL DATA:  Altered mental status and epistaxis. History of cerebral infarction. EXAM: CT HEAD WITHOUT CONTRAST CT MAXILLOFACIAL WITHOUT CONTRAST TECHNIQUE: Multidetector CT imaging of the head and maxillofacial structures were performed using the standard protocol without intravenous contrast. Multiplanar CT image reconstructions of the maxillofacial structures were also generated. COMPARISON:  CT of the head on 07/07/2016 FINDINGS: CT HEAD FINDINGS Brain: Stable all left-sided MCA territory infarct. The brain demonstrates no evidence of hemorrhage, infarction, edema, mass effect, extra-axial fluid collection, hydrocephalus or mass lesion. The skull is unremarkable. Vascular: No hyperdense vessel or unexpected calcification. Skull: Normal. Negative for fracture or focal lesion. Other: None. CT MAXILLOFACIAL FINDINGS Osseous: No maxillofacial fracture or mandibular dislocation. No destructive process. 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 Orbits: Negative. No traumatic or inflammatory finding. Sinuses: Clear. Soft tissues: No visualized soft tissue masses or enlarged lymph nodes. The visualized airway is normally patent. IMPRESSION: 1. 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 No acute findings by head CT. 2. No acute findings by a maxillofacial CT. 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 Electronically Signed   By: Aletta Edouard M.D.   On: 07/16/2017 10:35     Medications: Scheduled Meds: . chlorhexidine  15 mL Mouth Rinse BID  . finasteride  5 mg Oral QPM  . folic acid  1 mg Intravenous Daily  . insulin aspart  0-9 Units Subcutaneous Q4H  . mouth rinse  15 mL Mouth Rinse q12n4p  . nystatin  5 mL Oral QID  . [START ON 07/19/2017] pantoprazole  40  mg Intravenous Q12H  .  tamsulosin  0.4 mg Oral QPC supper  . thiamine  100 mg Intravenous Daily  . ursodiol  600 mg Oral BID   Continuous Infusions: . dextrose 1,000 mL (07/16/17 1736)  . levETIRAcetam Stopped (07/16/17 2307)  . meropenem (MERREM) IV Stopped (07/16/17 2134)   PRN Meds:.acetaminophen **OR** acetaminophen, lip balm, ondansetron **OR** ondansetron (ZOFRAN) IV    Assessment/Plan: 71 y.o. male with multiple comorbidities  He is not having overt GI bleeding now that INR is more normal level.  Unclear source of bleeding but he is in no shape for endoscopic testing currently.  Would continue IV PPI twice daily and follow him clinically.  Will need to address nutrition status.  His daughter told me he is getting a swallow evaluation today.  I expect that he will fail that test and it will come down to providing him tube feeds at least temporarily.  If that is the case, I recommend a dobhov trial of tube feeds for several days before gtube placement.  Will follow along.  Milus Banister, MD  07/17/2017, 7:50 AM [facility] Pager [phone]

## 2017-07-17 NOTE — Progress Notes (Signed)
Preliminary results by tech - Right Lower Ex.t Venous Completed. 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 All other veins are patent without evidence of thrombus. Results given to [facility], patient's nurse.  Marilynne Halsted, BS, RDMS, RVT

## 2017-07-17 NOTE — Evaluation (Signed)
Clinical/Bedside Swallow Evaluation Patient Details  Name: Gilbert Lewis MRN: [id number] Date of Birth: 1945-11-24  Today's Date: 07/17/2017 Time: SLP Start Time (ACUTE ONLY): 4098 SLP Stop Time (ACUTE ONLY): 0918 SLP Time Calculation (min) (ACUTE ONLY): 45 min  Past Medical History:  Past Medical History:  Diagnosis Date  . Cataract   . CKD (chronic kidney disease)   . Depression   . Diabetes mellitus without complication (HCC)   . Diabetic retinopathy (HCC)   . Hypertension   . Stroke [facility])    Past Surgical History:  Past Surgical History:  Procedure Laterality Date  . APPENDECTOMY    . SKIN BIOPSY     HPI:  Gilbert Lewis a 71 y.o.malewith medical history significant of hereditary spastic hemiplegia, CVA with resultant aphasia,DM2 , and HTN, history of ESBL infection. , HTN, DVT, DISH (Diffuse idiopathic skeletal hyperostosis), PEG after CVA admitted due to not eating or drinking x 1 week per family. Found to be hypernatremic and CT of abdomen showed urinary retention with bilateral hydronephrosis. 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 MBS 07/07/16 mild delay, UES residue, regular/thin recommended.    Assessment / Plan / Recommendation Clinical Impression  Examination of oral cavity revealed COPIOUS dried secretions adhered to hard and soft palate, glossopalatine arches and tongue. Oral decontamination using toothbrush, Yankeur, Medline mouthrinse, ice chips, 1/2 tsp water and tweezers removed 90% of debris revealing pink mucousa after 35 minutes. Pt encouraged to breathe through nasal cavity versus oral cavity. RN present and observed. Ensured pt is on the high risk oral care protocol. Do not feel pt will need a PEG. Recommend aggressive oral care today/tonight and SLP will return tomorrow. Prognosis for initiation of po's and/or  appropriateness for instrumental assessment is good. Short term alternative means of nutrition recommended and ice chips following oral care to keep oral and pharyngeal mucosa moist.   SLP Visit Diagnosis: Dysphagia, oropharyngeal phase (R13.12)    Aspiration Risk  Moderate aspiration risk    Diet Recommendation Alternative means - temporary   Medication Administration: Via alternative means    Other  Recommendations Oral Care Recommendations: Oral care QID   Follow up Recommendations  (TBD)      Frequency and Duration min 2x/week  2 weeks       Prognosis Prognosis for Safe Diet Advancement: Good      Swallow Study   General HPI: Gilbert Lewis a 71 y.o.malewith medical history significant of hereditary spastic hemiplegia, CVA with resultant aphasia,DM2 , and HTN, history of ESBL infection. , HTN, DVT, DISH (Diffuse idiopathic skeletal hyperostosis), PEG after CVA admitted due to not eating or drinking x 1 week per family. Found to be hypernatremic and CT of abdomen showed urinary retention with bilateral hydronephrosis. 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 MBS 07/07/16 mild delay, UES residue, regular/thin recommended.  Type of Study: Bedside Swallow Evaluation Previous Swallow Assessment:  (see HPI) Diet Prior to this Study: NPO Temperature Spikes Noted: No Respiratory Status: Room air History of Recent Intubation: No Behavior/Cognition: Alert;Requires cueing;Cooperative Oral Cavity Assessment: Dried secretions;Dry;Lesions (copious dreied secretions!) Oral Care Completed by SLP: Yes Oral Cavity - Dentition: Adequate natural dentition Vision: Functional for self-feeding Self-Feeding Abilities: Needs assist;Needs set up Patient Positioning: Upright in bed Baseline Vocal  Quality: Low vocal intensity Volitional Cough:  Weak Volitional Swallow: Unable to elicit    Oral/Motor/Sensory Function Overall Oral Motor/Sensory Function: Generalized oral weakness   Ice Chips Ice chips: Impaired Presentation: Self Fed Oral Phase Impairments: Reduced lingual movement/coordination Pharyngeal Phase Impairments: Suspected delayed Swallow;Decreased hyoid-laryngeal movement   Thin Liquid Thin Liquid: Impaired Presentation: Spoon;Cup Oral Phase Impairments: Reduced lingual movement/coordination Oral Phase Functional Implications: Prolonged oral transit Pharyngeal  Phase Impairments: Suspected delayed Swallow;Decreased hyoid-laryngeal movement    Nectar Thick Nectar Thick Liquid: Not tested   Honey Thick Honey Thick Liquid: Not tested   Puree Puree: Not tested   Solid   GO   Solid: Not tested        Gilbert Lewis 07/17/2017,9:47 AM  Gilbert Lewis.Ed [employer] [phone]

## 2017-07-17 NOTE — Progress Notes (Signed)
Lab in room attempting to draw renal function panel.  This RN called to room by lab due to patient refusal.  Pt. Noted to be shaking his head and saying "no, no, no, no."  RR noted to be 42-56 during this interaction.  Pt refusing labs at this time.  When staff backed away from patient RR normalized to 15. Patient's daughter states patient has a mistrust of healthcare workers and used to have panic attacks.  Pt. Noted to be anxious when staff attempts to get near patient.   Linton Flemings, NP notified of anxiety.  Orders received

## 2017-07-18 ENCOUNTER — Inpatient Hospital Stay (HOSPITAL_COMMUNITY): Payer: Medicare Other

## 2017-07-18 LAB — RENAL FUNCTION PANEL
ALBUMIN: 2.3 g/dL — AB (ref 3.5–5.0)
Albumin: 2.3 g/dL — ABNORMAL LOW (ref 3.5–5.0)
Albumin: 2.5 g/dL — ABNORMAL LOW (ref 3.5–5.0)
Anion gap: 6 (ref 5–15)
Anion gap: 6 (ref 5–15)
Anion gap: 5 (ref 5–15)
BUN: 43 mg/dL — ABNORMAL HIGH (ref 6–20)
BUN: 39 mg/dL — ABNORMAL HIGH (ref 6–20)
BUN: 37 mg/dL — AB (ref 6–20)
CALCIUM: 7.8 mg/dL — AB (ref 8.9–10.3)
CO2: 19 mmol/L — ABNORMAL LOW (ref 22–32)
CO2: 21 mmol/L — ABNORMAL LOW (ref 22–32)
CO2: 21 mmol/L — ABNORMAL LOW (ref 22–32)
CREATININE: 1.39 mg/dL — AB (ref 0.61–1.24)
Calcium: 7.9 mg/dL — ABNORMAL LOW (ref 8.9–10.3)
Calcium: 8 mg/dL — ABNORMAL LOW (ref 8.9–10.3)
Chloride: 125 mmol/L — ABNORMAL HIGH (ref 101–111)
Chloride: 123 mmol/L — ABNORMAL HIGH (ref 101–111)
Chloride: 122 mmol/L — ABNORMAL HIGH (ref 101–111)
Creatinine, Ser: 1.39 mg/dL — ABNORMAL HIGH (ref 0.61–1.24)
Creatinine, Ser: 1.34 mg/dL — ABNORMAL HIGH (ref 0.61–1.24)
GFR calc Af Amer: 58 mL/min — ABNORMAL LOW (ref >60)
GFR calc Af Amer: >60 mL/min (ref >60)
GFR calc Af Amer: 58 mL/min — ABNORMAL LOW (ref >60)
GFR calc non Af Amer: 50 mL/min — ABNORMAL LOW (ref >60)
GFR calc non Af Amer: 52 mL/min — ABNORMAL LOW (ref >60)
GFR, EST NON AFRICAN AMERICAN: 50 mL/min — AB (ref >60)
GLUCOSE: 161 mg/dL — AB (ref 65–99)
Glucose, Bld: 105 mg/dL — ABNORMAL HIGH (ref 65–99)
Glucose, Bld: 131 mg/dL — ABNORMAL HIGH (ref 65–99)
PHOSPHORUS: 2.7 mg/dL (ref 2.5–4.6)
POTASSIUM: 3.5 mmol/L (ref 3.5–5.1)
Phosphorus: 2 mg/dL — ABNORMAL LOW (ref 2.5–4.6)
Phosphorus: 2.4 mg/dL — ABNORMAL LOW (ref 2.5–4.6)
Potassium: 3.2 mmol/L — ABNORMAL LOW (ref 3.5–5.1)
Potassium: 3 mmol/L — ABNORMAL LOW (ref 3.5–5.1)
SODIUM: 148 mmol/L — AB (ref 135–145)
Sodium: 150 mmol/L — ABNORMAL HIGH (ref 135–145)
Sodium: 150 mmol/L — ABNORMAL HIGH (ref 135–145)

## 2017-07-18 LAB — GLUCOSE, CAPILLARY
GLUCOSE-CAPILLARY: 129 mg/dL — AB (ref 65–99)
Glucose-Capillary: 106 mg/dL — ABNORMAL HIGH (ref 65–99)
Glucose-Capillary: 110 mg/dL — ABNORMAL HIGH (ref 65–99)
Glucose-Capillary: 130 mg/dL — ABNORMAL HIGH (ref 65–99)
Glucose-Capillary: 141 mg/dL — ABNORMAL HIGH (ref 65–99)
Glucose-Capillary: 157 mg/dL — ABNORMAL HIGH (ref 65–99)

## 2017-07-18 LAB — CBC
HCT: 33.5 % — ABNORMAL LOW (ref 39.0–52.0)
Hemoglobin: 10.8 g/dL — ABNORMAL LOW (ref 13.0–17.0)
MCH: 27.7 pg (ref 26.0–34.0)
MCHC: 32.2 g/dL (ref 30.0–36.0)
MCV: 85.9 fL (ref 78.0–100.0)
PLATELETS: 170 10*3/uL (ref 150–400)
RBC: 3.9 MIL/uL — ABNORMAL LOW (ref 4.22–5.81)
RDW: 15.1 % (ref 11.5–15.5)
WBC: 13.6 10*3/uL — AB (ref 4.0–10.5)

## 2017-07-18 LAB — URINE CULTURE: Culture: >=100000 — AB

## 2017-07-18 MED ORDER — POTASSIUM CHLORIDE 10 MEQ/100ML IV SOLN
10.0000 meq | INTRAVENOUS | Status: AC
  Administered 2017-07-18 – 2017-07-19 (×3): 10 meq via INTRAVENOUS
  Filled 2017-07-18 (×3): qty 100

## 2017-07-18 MED ORDER — RESOURCE THICKENUP CLEAR PO POWD
ORAL | Status: DC | PRN
  Filled 2017-07-18 (×2): qty 125

## 2017-07-18 MED ORDER — AMPICILLIN SODIUM 1 G IJ SOLR
1.0000 g | Freq: Four times a day (QID) | INTRAMUSCULAR | Status: DC
  Administered 2017-07-18 – 2017-07-20 (×9): 1 g via INTRAVENOUS
  Filled 2017-07-18 (×13): qty 1000

## 2017-07-18 NOTE — Progress Notes (Signed)
[facility] Progress Note    Since last GI note: No overt GI bleeding overnight.  Speech therapy consults "generalized oral weakness" they recommended aggressive oral care and short term alternative means of nutrition.  They do not feel he will need G-tube.  Objective: Vital signs in last 24 hours: Temp:  [97.7 F (36.5 C)-99.2 F (37.3 C)] 97.8 F (36.6 C) (10/16 0318) Pulse Rate:  [83-91] 83 (10/16 0318) Resp:  [14-30] 14 (10/16 0318) BP: (107-156)/(59-98) 107/59 (10/16 0318) SpO2:  [98 %-100 %] 100 % (10/16 0318) Last BM Date: 07/16/17 General: alert and oriented times zero, responds to voice, looks around the room with mouth open Heart: regular rate and rythm Abdomen: soft, non-tender, non-distended, normal bowel sounds   Lab Results:  Recent Labs  07/16/17 0343 07/16/17 1437 07/16/17 2052  WBC 12.3* 11.3* 12.7*  HGB 10.7* 10.6* 10.7*  PLT 218 196 205  MCV 88.3 88.7 88.4    Recent Labs  07/17/17 0336 07/17/17 1239 07/17/17 2050  NA 157* 154* 154*  K 3.7 3.3* 4.1  CL >130* 128* 130*  CO2 19* 21* 17*  GLUCOSE 111* 132* 130*  BUN 56* 48* 45*  CREATININE 1.64* 1.50* 1.45*  CALCIUM 7.9* 8.0* 8.2*    Recent Labs  07/15/17 1847  07/17/17 0336 07/17/17 1239 07/17/17 2050  PROT 7.1  --  6.0*  --   --   ALBUMIN 2.7*  < > 2.4* 2.4* 2.4*  AST 19  --  18  --   --   ALT 10*  --  9*  --   --   ALKPHOS 67  --  65  --   --   BILITOT 1.0  --  0.9  --   --   < > = values in this interval not displayed.  Recent Labs  07/15/17 1847 07/17/17 0336  INR 1.24 1.24     Studies/Results: Ct Head Wo Contrast  Result Date: 07/16/2017 CLINICAL DATA:  Altered mental status and epistaxis. History of cerebral infarction. EXAM: CT HEAD WITHOUT CONTRAST CT MAXILLOFACIAL WITHOUT CONTRAST TECHNIQUE: Multidetector CT imaging of the head and maxillofacial structures were performed using the standard protocol without intravenous contrast. Multiplanar CT image  reconstructions of the maxillofacial structures were also generated. COMPARISON:  CT of the head on 07/07/2016 FINDINGS: CT HEAD FINDINGS Brain: Stable all left-sided MCA territory infarct. The brain demonstrates no evidence of hemorrhage, infarction, edema, mass effect, extra-axial fluid collection, hydrocephalus or mass lesion. The skull is unremarkable. Vascular: No hyperdense vessel or unexpected calcification. Skull: Normal. Negative for fracture or focal lesion. Other: None. CT MAXILLOFACIAL FINDINGS Osseous: No maxillofacial fracture or mandibular dislocation. No destructive process. 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 Orbits: Negative. No traumatic or inflammatory finding. Sinuses: Clear. Soft tissues: No visualized soft tissue masses or enlarged lymph nodes. The visualized airway is normally patent. IMPRESSION: 1. 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 No acute findings by head CT. 2. No acute findings by a maxillofacial CT. 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 Electronically Signed   By: Irish Lack M.D.   On: 07/16/2017 10:35   Ct Maxillofacial Wo Contrast  Result Date: 07/16/2017 CLINICAL DATA:  Altered mental status and epistaxis. History of cerebral infarction. EXAM: CT HEAD WITHOUT CONTRAST CT MAXILLOFACIAL WITHOUT CONTRAST TECHNIQUE: Multidetector CT imaging of the head and  maxillofacial structures were performed using the standard protocol without intravenous contrast. Multiplanar CT image reconstructions of the maxillofacial structures were also generated. COMPARISON:  CT of the head on 07/07/2016 FINDINGS: CT HEAD FINDINGS Brain: Stable all left-sided MCA territory infarct. The brain demonstrates no evidence of hemorrhage, infarction, edema, mass effect, extra-axial fluid collection,  hydrocephalus or mass lesion. The skull is unremarkable. Vascular: No hyperdense vessel or unexpected calcification. Skull: Normal. Negative for fracture or focal lesion. Other: None. CT MAXILLOFACIAL FINDINGS Osseous: No maxillofacial fracture or mandibular dislocation. No destructive process. 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 Orbits: Negative. No traumatic or inflammatory finding. Sinuses: Clear. Soft tissues: No visualized soft tissue masses or enlarged lymph nodes. The visualized airway is normally patent. IMPRESSION: 1. 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 No acute findings by head CT. 2. No acute findings by a maxillofacial CT. 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 Electronically Signed   By: Irish Lack M.D.   On: 07/16/2017 10:35     Medications: Scheduled Meds: . chlorhexidine  15 mL Mouth Rinse BID  . chlorhexidine  15 mL Mouth Rinse BID  . finasteride  5 mg Oral QPM  . folic acid  1 mg Intravenous Daily  . insulin aspart  0-9 Units Subcutaneous Q4H  . mouth rinse  15 mL Mouth Rinse q12n4p  . mouth rinse  15 mL Mouth Rinse q12n4p  . nystatin  5 mL Oral QID  . [START ON 07/19/2017] pantoprazole  40 mg Intravenous Q12H  . tamsulosin  0.4 mg Oral QPC supper  . thiamine  100 mg Intravenous Daily  . ursodiol  600 mg Oral BID   Continuous Infusions: . dextrose 75 mL/hr at 07/17/17 0730  . levETIRAcetam Stopped (07/17/17 2206)  . meropenem (MERREM) IV Stopped (07/17/17 2052)  . methocarbamol (ROBAXIN)  IV Stopped (07/18/17 0216)   PRN Meds:.acetaminophen **OR** acetaminophen, fentaNYL (SUBLIMAZE) injection, hydrocortisone, lip balm, methocarbamol (ROBAXIN)  IV, ondansetron **OR** ondansetron (ZOFRAN) IV    Assessment/Plan: 71 y.o. male with 'dark stools' reported at [facility], UTI sepsis,  acute urinary retention  No overt bleeding here since admit however he was hemocult +.  He's had brown stools and Hb has been stable. I do not think he needs invasive GI testing.  When he can take oral meds, it will be safe to change to twice daily PPI for a month and then once daily from thereafter.  Nutrition status is a big issue here. I recommend placement of nasal feeding tube and using it for nutrition, meds.  This can be used for several days, a few weeks even.  If longer term feeding tube is needed, please contact IR for G tube placement.  Please call or page with any further questions or concerns.     Rachael Fee, MD  07/18/2017, 7:21 AM [facility] Pager [phone]

## 2017-07-18 NOTE — Progress Notes (Addendum)
Modified Barium Swallow Progress Note  Patient Details  Name: Gilbert Lewis MRN: [id number] Date of Birth: 02-24-1946  Today's Date: 07/18/2017  Modified Barium Swallow completed.  Full report located under Chart Review in the Imaging Section.  Brief recommendations include the following:  Clinical Impression  Prolonged mastication and transit with solid texture impacted by xerostomia and dried soft palate secretions that SLP has been performing oral care/decontamination/infection control. Timing and coordination of swallow within functional limits. He exhibited decreased epiglottic inversion resulting in consistent vallecular residue (increased with solid texture). Verbal cues successful with second swallow significantly decreasing pharyngeal residue. Laryngeal penetration of straw sip thin remaining in vestibule and unable to produce volitional cough. Cup sips thin liquids did not intrude laryngeal vestibule however aspiration risk is high with dependent feeder, cognitive deficits and premorbid aphasia (receptive in addition to expressive?). Nectar consistency did not significantly increase pharyngeal residue. Recommend Dys 1 (puree), nectar thick liquids (should be able to upgrade at bedside with cups), no straws, crush pills, FULL supervision and assist, swallow twice. Continue intensive oral care. ST will continue to follow.    Swallow Evaluation Recommendations       SLP Diet Recommendations: Dysphagia 1 (Puree) solids;Nectar thick liquid   Liquid Administration via: Cup;No straw   Medication Administration: Crushed with puree   Supervision: Full assist for feeding;Full supervision/cueing for compensatory strategies   Compensations: Slow rate;Small sips/bites;Multiple dry swallows after each bite/sip;Minimize environmental distractions   Postural Changes: Seated upright at 90 degrees   Oral Care Recommendations: Oral care BID        Royce Macadamia 07/18/2017,3:24  PM  Breck Coons [name].Ed [employer] [phone]

## 2017-07-18 NOTE — Progress Notes (Signed)
  Speech Language Pathology Treatment: Dysphagia  Patient Details Name: Gilbert Lewis MRN: [id number] DOB: 04-19-1946 Today's Date: 07/18/2017 Time: 9562-1308 SLP Time Calculation (min) (ACUTE ONLY): 24 min  Assessment / Plan / Recommendation Clinical Impression  Dried secretions starting to adhere once again (mild) to glossopalantine arches. SLP attempted oral care/decontamination and pt very resistant today; removed min amount of dried skin on lips and tongue; unable for soft palate. Ice chips and teaspoon/cup sips water consumed to moisten oral cavity. Suspect delayed swallow initiation and unable to appreciate full pharyngeal ability of swallow integrity. Will need MBS and hope pt will be able to cooperate. Expressive aphasia was barrier for pt to communicate with SLP today (more than yesterday). MBS scheduled today at 2:00.   HPI HPI: Gilbert Lewis a 71 y.o.malewith medical history significant of hereditary spastic hemiplegia, CVA with resultant aphasia,DM2 , and HTN, history of ESBL infection. , HTN, DVT, DISH (Diffuse idiopathic skeletal hyperostosis), PEG after CVA admitted due to not eating or drinking x 1 week per family. Found to be hypernatremic and CT of abdomen showed urinary retention with bilateral hydronephrosis. 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 MBS 07/07/16 mild delay, UES residue, regular/thin recommended.       SLP Plan  MBS       Recommendations  Diet recommendations: NPO                Oral Care Recommendations: Oral care QID Follow up Recommendations:  (TBD) SLP Visit Diagnosis: Dysphagia, oropharyngeal phase (R13.12) Plan: MBS       GO                Royce Macadamia 07/18/2017, 11:55 AM  Breck Coons [name].Ed [employer] [phone]

## 2017-07-18 NOTE — Progress Notes (Signed)
[facility] Progress Note  Patient: Gilbert Lewis ZOX:[id number]   PCP: Patient, No Pcp Per DOB: May 27, 1946   DOA: 07/15/2017   DOS: 07/18/2017   Date of Service: the patient was seen and examined on 07/18/2017  Subjective: patient frustrated this morning, is concern regarding passing his swallowing evaluation.  Brief hospital course: Pt. with PMH of CVA, hemiplegia, aphasia, type II DM, HTN, DVT; admitted on 07/15/2017, presented with complaint of confusion and poor by mouth intake, was found to have hyponatremia, acute kidney injury, suspected sepsis. Currently further plan is continue his IV antibiotics and IV fluids.  Assessment and Plan: 1. Suspected sepsis with UTI. VRE UTI Patient has history of ESBL UTI.now on contact precaution due to VRE. Urine culture is growing enterococcus faecalis, blood culture negative. Leukocytosis present. Tachycardia. Patient was started on IV meropenem based on prior sensitivities.I would change him to IV ampicillin based on sensitivity.  2. GI bleed. Patient had some melena in the ER. Despite aggressive IV hydration, his H&H is relatively stable. No further GI bleeding reported. Patient was started on IV Protonix infusion as well as every 12 hours injection. Currently I will transition him to every 12 hours injection. Monitor CBC. GI has been consulted,no active bleeding, transitioned to telemetry.  3. Acute kidney injury. CKD stage III Hypokalemia. Hyper chloremia. Hypernatremia. Metabolic acidosis. Lactic acid normal. Electrolyte abnormality is more likely due to poor by mouth intake and dehydration. We'll correct slowly with D5 fluids. Renal function with serum creatinine is actually improving as well as BUN. Monitor every 8 hours lab for now. Avoid nephrotoxic medications.  4. Type 2 diabetes mellitus. Hemoglobin A1c 5.9. On sensitive sliding scale every 4 hours.  5. Essential hypertension. Blood pressure medications are  currently on hold. Monitor.  6. Acute urinary retention. Likely contributing to acute kidney injury. Foley catheter inserted further attention. Patient will benefit from urology follow-up.  7. Right lower leg edema. Prior DVT was 2 years ago. Follow up on Doppler.  8. History of seizure. Patient was on oral Keppra, currently on IV. Monitor.  9. Goals of care discussion. Earlier provider discussed with family regarding goals of care. Family at this point would like to avoid overaggressive intervention but agreeable to endoscopy. Family would also be interested in possibility starting tube feeding.  10. Black color deposits on oral mucosa. Headache. CT head and CT maxillofacial performed. Unremarkable. Oral care for now. Patient remains nothing by mouth for now.  11. dysphagia due to prior stroke. Patient has aphasia as well as dysphagia due to prior stroke. Was able to eat prior to this admission. Appreciate speech therapy input. Underwent modified barium swallow today, started on dysphagia 1 diet with nectar thick liquids. Do not see that the patient will require peg tube placement. May Require Temporary Cortrack If transition to activities required, per GI and would like to monitor the patient on cor track before deciding to do that.  Diet: dysphagia 1 diet with nectar thick liquids. DVT Prophylaxis:mechanical compression device  Advance goals of care discussion: full code  Family Communication: no family was present at bedside, at the time of interview.  Disposition:  Discharge to be determined. .  Consultants: gastroenterology  Procedures: none  Antibiotics: Anti-infectives    Start     Dose/Rate Route Frequency Ordered Stop   07/18/17 0800  ampicillin (OMNIPEN) 1 g in sodium chloride 0.9 % 50 mL IVPB     1 g 150 mL/hr over 20 Minutes Intravenous Every 6 hours 07/18/17  1610     07/16/17 0800  meropenem (MERREM) 1 g in sodium chloride 0.9 % 100 mL IVPB  Status:   Discontinued     1 g 200 mL/hr over 30 Minutes Intravenous Every 12 hours 07/16/17 0728 07/18/17 0755   07/15/17 2015  vancomycin (VANCOCIN) IVPB 1000 mg/200 mL premix     1,000 mg 200 mL/hr over 60 Minutes Intravenous  Once 07/15/17 2005 07/15/17 2201   07/15/17 2015  piperacillin-tazobactam (ZOSYN) IVPB 3.375 g     3.375 g 100 mL/hr over 30 Minutes Intravenous  Once 07/15/17 2005 07/15/17 2118       Objective: Physical Exam: Vitals:   07/18/17 0721 07/18/17 1057 07/18/17 1227 07/18/17 1518  BP: 128/79 (!) 141/85 (!) 151/77 134/77  Pulse: 88 89 89 91  Resp: Temp: 98.9 F (37.2 C)  99.8 F (37.7 C) 98.1 F (36.7 C)  TempSrc: Oral  Axillary Axillary  SpO2: 95% 99% 98% 96%  Weight:      Height:        Intake/Output Summary (Last 24 hours) at 07/18/17 1858 Last data filed at 07/18/17 1700  Gross per 24 hour  Intake          1161.25 ml  Output             1800 ml  Net          -638.75 ml   Filed Weights   07/15/17 1838 07/16/17 2016  Weight: 72.6 kg (160 lb) 68.9 kg (151 lb 14.4 oz)   General: Alert, Awake. Appear in mild distress, affect difficult to assess Eyes: PERRL, Conjunctiva normal ENT: Oral Mucosa black deposit Neck: difficult to assess JVD, no Abnormal Mass Or lumps Cardiovascular: S1 and S2 Present, no Murmur, Peripheral Pulses Present Respiratory: normal respiratory effort, Bilateral Air entry equal and Decreased, no use of accessory muscle, Clear to Auscultation, no Crackles, no wheezes Abdomen: Bowel Sound present, Soft and no tenderness, no hernia Skin: no redness, no Rash, no induration Extremities: no Pedal edema, no calf tenderness Neurologic: Grossly no focal neuro deficit. Bilaterally Equal motor strength  Data Reviewed: CBC:  Recent Labs Lab 07/15/17 1847 07/16/17 0343 07/16/17 1437 07/16/17 2052 07/18/17 0538  WBC 14.2* 12.3* 11.3* 12.7* 13.6*  NEUTROABS 10.5*  --   --   --   --   HGB 12.6* 10.7* 10.6* 10.7* 10.8*  HCT  40.9 35.5* 35.4* 35.2* 33.5*  MCV 87.6 88.3 88.7 88.4 85.9  PLT 289 218 196 205 170   Basic Metabolic Panel:  Recent Labs Lab 07/16/17 2052 07/17/17 0336 07/17/17 1239 07/17/17 2050 07/18/17 0538 07/18/17 1451  NA 154* 157* 154* 154* 150* 150*  K 3.2* 3.7 3.3* 4.1 3.2* 3.0*  CL 129* >130* 128* 130* 125* 123*  CO2 19* 19* 21* 17* 19* 21*  GLUCOSE 124* 111* 132* 130* 105* 131*  BUN 63* 56* 48* 45* 43* 39*  CREATININE 1.70* 1.64* 1.50* 1.45* 1.39* 1.34*  CALCIUM 7.7* 7.9* 8.0* 8.2* 7.9* 8.0*  MG 1.6* 2.0  --   --   --   --   PHOS 2.1* 2.1* 1.9* 2.3* 2.0* 2.4*    Liver Function Tests:  Recent Labs Lab 07/15/17 1847  07/17/17 0336 07/17/17 1239 07/17/17 2050 07/18/17 0538 07/18/17 1451  AST 19  --  18  --   --   --   --   ALT 10*  --  9*  --   --   --   --  ALKPHOS 67  --  65  --   --   --   --   BILITOT 1.0  --  0.9  --   --   --   --   PROT 7.1  --  6.0*  --   --   --   --   ALBUMIN 2.7*  < > 2.4* 2.4* 2.4* 2.3* 2.5*  < > = values in this interval not displayed.  Recent Labs Lab 07/16/17 0343  LIPASE 55*   No results for input(s): AMMONIA in the last 168 hours. Coagulation Profile:  Recent Labs Lab 07/15/17 1847 07/17/17 0336  INR 1.24 1.24   Cardiac Enzymes:  Recent Labs Lab 07/15/17 2124 07/16/17 2052 07/17/17 0336  CKTOTAL 78  --   --   TROPONINI  --  <0.03 <0.03   BNP (last 3 results) No results for input(s): PROBNP in the last 8760 hours. CBG:  Recent Labs Lab 07/17/17 2300 07/18/17 0318 07/18/17 0824 07/18/17 1234 07/18/17 1737  GLUCAP 117* 110* 106* 129* 130*   Studies: Dg Abd Portable 1v  Result Date: 07/18/2017 CLINICAL DATA:  Gastrointestinal bleeding.  Abdominal pain. EXAM: PORTABLE ABDOMEN - 1 VIEW COMPARISON:  CT abdomen and pelvis 07/16/2017. FINDINGS: The bowel gas pattern is normal. No radio-opaque calculi or other significant radiographic abnormality are seen. IMPRESSION: Negative exam. Electronically Signed   By:  Drusilla Kanner M.D.   On: 07/18/2017 09:13   Dg Swallowing Func-speech Pathology  Result Date: 07/18/2017 Objective Swallowing Evaluation: Type of Study: MBS-Modified Barium Swallow Study Patient Details Name: Gilbert Lewis MRN: [id number] Date of Birth: [birth year] Today's Date: 07/18/2017 Time: SLP Start Time (ACUTE ONLY): 1413-SLP Stop Time (ACUTE ONLY): 1430 SLP Time Calculation (min) (ACUTE ONLY): 17 min Past Medical History: Past Medical History: Diagnosis Date . Cataract  . CKD (chronic kidney disease)  . Depression  . Diabetes mellitus without complication (HCC)  . Diabetic retinopathy (HCC)  . Hypertension  . Stroke [facility])  Past Surgical History: Past Surgical History: Procedure Laterality Date . APPENDECTOMY   . SKIN BIOPSY   HPI: Yussuf Sawyers a 71 y.o.malewith medical history significant of hereditary spastic hemiplegia, CVA with resultant aphasia,DM2 , and HTN, history of ESBL infection. , HTN, DVT, DISH (Diffuse idiopathic skeletal hyperostosis), PEG after CVA admitted due to not eating or drinking x 1 week per family. Found to be hypernatremic and CT of abdomen showed urinary retention with bilateral hydronephrosis. 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 MBS 07/07/16 mild delay, UES residue, regular/thin recommended.  No Data Recorded Assessment / Plan / Recommendation CHL IP CLINICAL IMPRESSIONS 07/18/2017 Clinical Impression Prolonged mastication and transit with solid texture impacted by xerostomia and dried soft palate secretions that SLP has been performing oral care/decontamination/infection control. Timing and coordination of swallow within functional limits. He exhibited decreased epiglottic inversion resulting in consistent vallecular residue (increased with solid texture). Verbal cues successful with second swallow significantly  decreasing pharyngeal residue. Laryngeal penetration of straw sip thin remaining in vestibule and unable to produce volitional cough. Cup sips thin liquids did not intrude laryngeal vestibule however aspiration risk is high with dependent feeder, cognitive deficits and premorbid aphasia (receptive in addition to expressive?). Nectar consistency did not significantly increase pharyngeal residue. Recommend Dys 1 (puree), nectar thick liquids (should be able to upgrade at bedside with cups), no straws, crush pills, FULL supervision and  assist, swallow twice. Continue intensive oral care. ST will continue to follow.  SLP Visit Diagnosis Dysphagia, oropharyngeal phase (R13.12) Attention and concentration deficit following -- Frontal lobe and executive function deficit following -- Impact on safety and function Moderate aspiration risk   CHL IP TREATMENT RECOMMENDATION 07/18/2017 Treatment Recommendations Therapy as outlined in treatment plan below   Prognosis 07/18/2017 Prognosis for Safe Diet Advancement (No Data) Barriers to Reach Goals Cognitive deficits Barriers/Prognosis Comment -- CHL IP DIET RECOMMENDATION 07/18/2017 SLP Diet Recommendations Dysphagia 1 (Puree) solids;Nectar thick liquid Liquid Administration via Cup;No straw Medication Administration Crushed with puree Compensations Slow rate;Small sips/bites;Multiple dry swallows after each bite/sip;Minimize environmental distractions Postural Changes Seated upright at 90 degrees   CHL IP OTHER RECOMMENDATIONS 07/18/2017 Recommended Consults -- Oral Care Recommendations Oral care BID Other Recommendations --   CHL IP FOLLOW UP RECOMMENDATIONS 07/18/2017 Follow up Recommendations Skilled Nursing facility   [facility] IP FREQUENCY AND DURATION 07/18/2017 Speech Therapy Frequency (ACUTE ONLY) min 2x/week Treatment Duration 2 weeks      CHL IP ORAL PHASE 07/18/2017 Oral Phase Impaired Oral - Pudding Teaspoon -- Oral - Pudding Cup -- Oral - Honey Teaspoon -- Oral - Honey  Cup -- Oral - Nectar Teaspoon -- Oral - Nectar Cup -- Oral - Nectar Straw -- Oral - Thin Teaspoon -- Oral - Thin Cup -- Oral - Thin Straw -- Oral - Puree -- Oral - Mech Soft -- Oral - Regular Delayed oral transit;Weak lingual manipulation Oral - Multi-Consistency -- Oral - Pill -- Oral Phase - Comment --  CHL IP PHARYNGEAL PHASE 07/18/2017 Pharyngeal Phase Impaired Pharyngeal- Pudding Teaspoon -- Pharyngeal -- Pharyngeal- Pudding Cup -- Pharyngeal -- Pharyngeal- Honey Teaspoon -- Pharyngeal -- Pharyngeal- Honey Cup -- Pharyngeal -- Pharyngeal- Nectar Teaspoon -- Pharyngeal -- Pharyngeal- Nectar Cup Pharyngeal residue - valleculae;Reduced epiglottic inversion Pharyngeal -- Pharyngeal- Nectar Straw -- Pharyngeal -- Pharyngeal- Thin Teaspoon -- Pharyngeal -- Pharyngeal- Thin Cup Pharyngeal residue - valleculae;Pharyngeal residue - pyriform;Reduced epiglottic inversion;Reduced anterior laryngeal mobility Pharyngeal Material does not enter airway Pharyngeal- Thin Straw Penetration/Aspiration during swallow;Pharyngeal residue - valleculae;Pharyngeal residue - pyriform;Reduced epiglottic inversion;Reduced anterior laryngeal mobility Pharyngeal Material enters airway, remains ABOVE vocal cords and not ejected out Pharyngeal- Puree -- Pharyngeal -- Pharyngeal- Mechanical Soft -- Pharyngeal -- Pharyngeal- Regular Pharyngeal residue - valleculae;Reduced epiglottic inversion;Pharyngeal residue - pyriform;Reduced anterior laryngeal mobility Pharyngeal -- Pharyngeal- Multi-consistency -- Pharyngeal -- Pharyngeal- Pill -- Pharyngeal -- Pharyngeal Comment --  CHL IP CERVICAL ESOPHAGEAL PHASE 07/07/2016 Cervical Esophageal Phase Impaired Pudding Teaspoon -- Pudding Cup -- Honey Teaspoon -- Honey Cup -- Nectar Teaspoon -- Nectar Cup -- Nectar Straw -- Thin Teaspoon -- Thin Cup Reduced cricopharyngeal relaxation Thin Straw Reduced cricopharyngeal relaxation Puree Reduced cricopharyngeal relaxation Mechanical Soft -- Regular Reduced  cricopharyngeal relaxation Multi-consistency -- Pill Reduced cricopharyngeal relaxation Cervical Esophageal Comment -- CHL IP GO 07/07/2016 Functional Assessment Tool Used skilled clinical judgment Functional Limitations Swallowing Swallow Current Status (Z6109) CI Swallow Goal Status (U0454) CI Swallow Discharge Status (U9811) CI Motor Speech Current Status (B1478) (None) Motor Speech Goal Status (G9562) (None) Motor Speech Goal Status (Z3086) (None) Spoken Language Comprehension Current Status (V7846) (None) Spoken Language Comprehension Goal Status (N6295) (None) Spoken Language Comprehension Discharge Status (M8413) (None) Spoken Language Expression Current Status (K4401) (None) Spoken Language Expression Goal Status (U2725) (None) Spoken Language Expression Discharge Status (D6644) (None) Attention Current Status (I3474) (None) Attention Goal Status (Q5956) (None) Attention Discharge Status (L8756) (None) Memory Current Status (E3329) (None) Memory Goal Status (J1884) (None) Memory  Discharge Status [phone]) (None) Voice Current Status [phone]) (None) Voice Goal Status (Z3086) (None) Voice Discharge Status [phone]) (None) Other Speech-Language Pathology Functional Limitation Current Status (N6295) (None) Other Speech-Language Pathology Functional Limitation Goal Status (M8413) (None) Other Speech-Language Pathology Functional Limitation Discharge Status [phone]) (None) Royce Macadamia 07/18/2017, 3:23 PM Breck Coons [name].Ed CCC-SLP Pager [phone]               Scheduled Meds: . chlorhexidine  15 mL Mouth Rinse BID  . folic acid  1 mg Intravenous Daily  . insulin aspart  0-9 Units Subcutaneous Q4H  . mouth rinse  15 mL Mouth Rinse q12n4p  . nystatin  5 mL Oral QID  . [START ON 07/19/2017] pantoprazole  40 mg Intravenous Q12H  . tamsulosin  0.4 mg Oral QPC supper  . thiamine  100 mg Intravenous Daily   Continuous Infusions: . ampicillin (OMNIPEN) IV Stopped (07/18/17 1816)  . dextrose 75 mL/hr  at 07/18/17 1828  . levETIRAcetam Stopped (07/18/17 1034)  . methocarbamol (ROBAXIN)  IV 500 mg (07/18/17 1555)   PRN Meds: acetaminophen **OR** acetaminophen, fentaNYL (SUBLIMAZE) injection, hydrocortisone, lip balm, methocarbamol (ROBAXIN)  IV, ondansetron **OR** ondansetron (ZOFRAN) IV, RESOURCE THICKENUP CLEAR  Time spent: 35 minutes  Author: Lynden Oxford, MD [facility] Pager: [phone] 07/18/2017 6:58 PM  If 7PM-7AM, please contact night-coverage at [email or web address], password [facility]

## 2017-07-19 LAB — RENAL FUNCTION PANEL
ALBUMIN: 2.3 g/dL — AB (ref 3.5–5.0)
ANION GAP: 7 (ref 5–15)
Albumin: 2.3 g/dL — ABNORMAL LOW (ref 3.5–5.0)
Anion gap: 6 (ref 5–15)
BUN: 32 mg/dL — AB (ref 6–20)
BUN: 32 mg/dL — AB (ref 6–20)
CALCIUM: 7.6 mg/dL — AB (ref 8.9–10.3)
CALCIUM: 7.4 mg/dL — AB (ref 8.9–10.3)
CHLORIDE: 115 mmol/L — AB (ref 101–111)
CO2: 20 mmol/L — AB (ref 22–32)
CO2: 19 mmol/L — AB (ref 22–32)
CREATININE: 1.25 mg/dL — AB (ref 0.61–1.24)
Chloride: 118 mmol/L — ABNORMAL HIGH (ref 101–111)
Creatinine, Ser: 1.13 mg/dL (ref 0.61–1.24)
GFR calc Af Amer: >60 mL/min (ref >60)
GFR calc non Af Amer: >60 mL/min (ref >60)
GFR, EST NON AFRICAN AMERICAN: 57 mL/min — AB (ref >60)
GLUCOSE: 127 mg/dL — AB (ref 65–99)
Glucose, Bld: 154 mg/dL — ABNORMAL HIGH (ref 65–99)
PHOSPHORUS: 2.8 mg/dL (ref 2.5–4.6)
Phosphorus: 2.7 mg/dL (ref 2.5–4.6)
Potassium: 3.4 mmol/L — ABNORMAL LOW (ref 3.5–5.1)
Potassium: 3.2 mmol/L — ABNORMAL LOW (ref 3.5–5.1)
SODIUM: 145 mmol/L (ref 135–145)
SODIUM: 140 mmol/L (ref 135–145)

## 2017-07-19 LAB — GLUCOSE, CAPILLARY
GLUCOSE-CAPILLARY: 148 mg/dL — AB (ref 65–99)
Glucose-Capillary: 104 mg/dL — ABNORMAL HIGH (ref 65–99)
Glucose-Capillary: 105 mg/dL — ABNORMAL HIGH (ref 65–99)
Glucose-Capillary: 133 mg/dL — ABNORMAL HIGH (ref 65–99)

## 2017-07-19 LAB — CBC WITH DIFFERENTIAL/PLATELET
Basophils Absolute: 0 10*3/uL (ref 0.0–0.1)
Basophils Relative: 0 %
EOS ABS: 0.2 10*3/uL (ref 0.0–0.7)
EOS PCT: 2 %
HCT: 30.9 % — ABNORMAL LOW (ref 39.0–52.0)
Hemoglobin: 10.1 g/dL — ABNORMAL LOW (ref 13.0–17.0)
LYMPHS ABS: 2.8 10*3/uL (ref 0.7–4.0)
LYMPHS PCT: 23 %
MCH: 28.1 pg (ref 26.0–34.0)
MCHC: 32.7 g/dL (ref 30.0–36.0)
MCV: 85.8 fL (ref 78.0–100.0)
MONO ABS: 0.4 10*3/uL (ref 0.1–1.0)
MONOS PCT: 3 %
Neutro Abs: 8.8 10*3/uL — ABNORMAL HIGH (ref 1.7–7.7)
Neutrophils Relative %: 72 %
PLATELETS: 175 10*3/uL (ref 150–400)
RBC: 3.6 MIL/uL — ABNORMAL LOW (ref 4.22–5.81)
RDW: 14.2 % (ref 11.5–15.5)
WBC: 12.2 10*3/uL — AB (ref 4.0–10.5)

## 2017-07-19 LAB — COMPREHENSIVE METABOLIC PANEL
ALT: 9 U/L — AB (ref 17–63)
ANION GAP: 5 (ref 5–15)
AST: 14 U/L — ABNORMAL LOW (ref 15–41)
Albumin: 2.3 g/dL — ABNORMAL LOW (ref 3.5–5.0)
Alkaline Phosphatase: 63 U/L (ref 38–126)
BUN: 34 mg/dL — ABNORMAL HIGH (ref 6–20)
CALCIUM: 7.7 mg/dL — AB (ref 8.9–10.3)
CHLORIDE: 122 mmol/L — AB (ref 101–111)
CO2: 21 mmol/L — AB (ref 22–32)
CREATININE: 1.31 mg/dL — AB (ref 0.61–1.24)
GFR, EST NON AFRICAN AMERICAN: 54 mL/min — AB (ref >60)
Glucose, Bld: 99 mg/dL (ref 65–99)
Potassium: 3.2 mmol/L — ABNORMAL LOW (ref 3.5–5.1)
SODIUM: 148 mmol/L — AB (ref 135–145)
Total Bilirubin: 0.6 mg/dL (ref 0.3–1.2)
Total Protein: 5.8 g/dL — ABNORMAL LOW (ref 6.5–8.1)

## 2017-07-19 LAB — PHOSPHORUS: PHOSPHORUS: 2.5 mg/dL (ref 2.5–4.6)

## 2017-07-19 LAB — MAGNESIUM: MAGNESIUM: 1.4 mg/dL — AB (ref 1.7–2.4)

## 2017-07-19 MED ORDER — POTASSIUM CHLORIDE CRYS ER 20 MEQ PO TBCR
20.0000 meq | EXTENDED_RELEASE_TABLET | Freq: Once | ORAL | Status: AC
  Administered 2017-07-19: 20 meq via ORAL
  Filled 2017-07-19: qty 1

## 2017-07-19 MED ORDER — INSULIN ASPART 100 UNIT/ML ~~LOC~~ SOLN: 0.0000 [IU] | Freq: Every day | SUBCUTANEOUS | Status: DC

## 2017-07-19 MED ORDER — INSULIN ASPART 100 UNIT/ML ~~LOC~~ SOLN
0.0000 [IU] | Freq: Three times a day (TID) | SUBCUTANEOUS | Status: DC
  Administered 2017-07-20: 2 [IU] via SUBCUTANEOUS
  Administered 2017-07-20: 1 [IU] via SUBCUTANEOUS
  Administered 2017-07-21 (×2): 2 [IU] via SUBCUTANEOUS
  Administered 2017-07-22: 1 [IU] via SUBCUTANEOUS
  Administered 2017-07-22: 2 [IU] via SUBCUTANEOUS
  Administered 2017-07-23: 1 [IU] via SUBCUTANEOUS

## 2017-07-19 MED ORDER — GLUCERNA SHAKE PO LIQD
237.0000 mL | Freq: Three times a day (TID) | ORAL | Status: DC
  Administered 2017-07-19 – 2017-07-23 (×11): 237 mL via ORAL
  Filled 2017-07-19 (×2): qty 237

## 2017-07-19 MED ORDER — ADULT MULTIVITAMIN W/MINERALS CH
1.0000 | ORAL_TABLET | Freq: Every day | ORAL | Status: DC
  Administered 2017-07-19 – 2017-07-23 (×5): 1 via ORAL
  Filled 2017-07-19 (×5): qty 1

## 2017-07-19 MED ORDER — LORAZEPAM 2 MG/ML IJ SOLN
1.0000 mg | Freq: Once | INTRAMUSCULAR | Status: AC
  Administered 2017-07-19: 1 mg via INTRAVENOUS
  Filled 2017-07-19: qty 1

## 2017-07-19 MED ORDER — POTASSIUM CHLORIDE CRYS ER 20 MEQ PO TBCR: 40.0000 meq | EXTENDED_RELEASE_TABLET | Freq: Once | ORAL | Status: DC

## 2017-07-19 NOTE — Progress Notes (Signed)
  Speech Language Pathology Treatment: Dysphagia  Patient Details Name: Gilbert Lewis MRN: [id number] DOB: 04/06/1946 Today's Date: 07/19/2017 Time: [id number] SLP Time Calculation (min) (ACUTE ONLY): 27 min  Assessment / Plan / Recommendation Clinical Impression  Dysphagia intervention during breakfast meal. Initially max-total assist for self feeding fading to max (needs assist to scoop and intermittent obtaining food from spoon). Labial residue with moderate awareness and ability to clear. Unable to perform second volitional swallow to clear suspected pharyngeal residue this session. Vocal quality wet upon phonation which cleared as he continued to vocalize. No cough or throat clearing. RN tech arrived and SLP educated/demonstrated slow pace, pt to bring utensil to oral cavity. Continue Dys 1, nectar liquids and ST.   HPI HPI: Gilbert Lewis a 71 y.o.malewith medical history significant of hereditary spastic hemiplegia, CVA with resultant aphasia,DM2 , and HTN, history of ESBL infection. , HTN, DVT, DISH (Diffuse idiopathic skeletal hyperostosis), PEG after CVA admitted due to not eating or drinking x 1 week per family. Found to be hypernatremic and CT of abdomen showed urinary retention with bilateral hydronephrosis. 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 MBS 70/5/17 mild delay, UES residue, regular/thin recommended.       SLP Plan  Continue with current plan of care       Recommendations  Diet recommendations: Dysphagia 1 (puree);Nectar-thick liquid Liquids provided via: Cup;No straw Medication Administration: Crushed with puree Supervision: Patient able to self feed;Full supervision/cueing for compensatory strategies;Staff to assist with self feeding Compensations: Minimize environmental distractions;Slow rate;Small sips/bites;Multiple  dry swallows after each bite/sip Postural Changes and/or Swallow Maneuvers: Seated upright 90 degrees                Oral Care Recommendations: Oral care BID Follow up Recommendations: Skilled Nursing facility SLP Visit Diagnosis: Dysphagia, oropharyngeal phase (R13.12) Plan: Continue with current plan of care       GO                Gilbert Lewis, Gilbert Lewis 07/19/2017, 9:03 AM  Gilbert CoonsLisa Lewis Gilbert Lewis M.Ed [employer] Pager [phone]

## 2017-07-19 NOTE — Progress Notes (Signed)
Pt agitated/restless, yelling, attempting to get OOB, pulling leads. MD Schoor paged. Orders received for 1mg  of Ativan IV. Will continue to monitor.

## 2017-07-19 NOTE — Progress Notes (Signed)
Transferred to 5west room12 by bed, stable, report given to RN. Belongings with pt.

## 2017-07-19 NOTE — Progress Notes (Signed)
PROGRESS NOTE    Gilbert Lewis  WJX:[id number] DOB: [birth year] DOA: 07/15/2017 PCP: Patient, No Pcp Per   Brief Narrative: Gilbert Lewis is a 71 y.o. with a PMH of CVA, hemiplegia, aphasia, type II DM, HTN, DVT; admitted on 07/15/2017, presented with complaint of confusion and poor by mouth intake, was found to have hyponatremia, acute kidney injury, suspected sepsis. Currently further plan is continue his IV antibiotics and IV fluids.   Assessment & Plan:   Active Problems:   Aphasia, late effect of cerebrovascular disease   Anemia, iron deficiency   CKD (chronic kidney disease) stage 3, GFR 30-59 ml/min (HCC)   Pressure ulcer   Diabetes mellitus with complication (HCC)   History of pressure ulcer   Moderate protein-calorie malnutrition (HCC)   Essential hypertension   Acute lower UTI   Melena   Sepsis (HCC)   Hypernatremia   Hypokalemia   Dehydration   AKI (acute kidney injury) (HCC)   GI bleed   Acute urinary retention   Sepsis secondary to UTI VRE UTI -continue ampicillin  GI bleed Hemoccult positive. Hemoglobin stable. GI consulted without recommendations currently. No evidence of continued bleeding.  Hypernatremia Likely secondary to poor PO intake. Improving with D5 water -continue D5 fluids -recheck BMP  Acute kidney injury on CKD stage III Improving -continue IV fluids  Hypokalemia Improving with repletion. -repeat BMP  Metabolic acidosis Likely secondary to acute kidney injury. Improving.  Type 2 diabetes mellitus Controlled. -continue SSI  Essential hypertension Normotensive  Acute urinary retention Secondary to UTI. S/p urethral catheter -will need voiding trial prior to discharge  Right lower leg edema Repeat doppler significant for chronic DVT  History of seizure -continue Keppra  Dysphagia Secondary to history of stroke.    DVT prophylaxis: SCDs Code Status: Full code Family Communication: None at bedside Disposition Plan:  Discharge when medically stable   Consultants:   Gastroenterology  Procedures:   None  Antimicrobials:  Vancomycin  Zosyn  Meropenem  Ampicillin    Subjective: Afebrile  Objective: Vitals:   07/19/17 0730 07/19/17 0800 07/19/17 1142 07/19/17 1705  BP: 103/76  139/79 127/72  Pulse: 83     Resp: 13   15  Temp: 97.8 F (36.6 C) 97.8 F (36.6 C) 97.6 F (36.4 C) 98.1 F (36.7 C)  TempSrc: Oral Oral Oral Oral  SpO2: 100%   99%  Weight:      Height:        Intake/Output Summary (Last 24 hours) at 07/19/17 1805 Last data filed at 07/19/17 1746  Gross per 24 hour  Intake           2372.5 ml  Output             1525 ml  Net            847.5 ml   Filed Weights   07/15/17 1838 07/16/17 2016  Weight: 72.6 kg (160 lb) 68.9 kg (151 lb 14.4 oz)    Examination:  General exam: Appears calm and comfortable Respiratory system: Clear to auscultation. Respiratory effort normal. Cardiovascular system: S1 & S2 heard, RRR. No murmurs. Gastrointestinal system: Abdomen is nondistended, soft and nontender. No organomegaly or masses felt. Normal bowel sounds heard. Central nervous system: Somnolent, responds to voice. Not oriented. Extremities: No calf tenderness    Data Reviewed: I have personally reviewed following labs and imaging studies  CBC:  Recent Labs Lab 07/15/17 1847 07/16/17 0343 07/16/17 1437 07/16/17 2052 07/18/17 0538 07/19/17 0228  WBC  14.2* 12.3* 11.3* 12.7* 13.6* 12.2*  NEUTROABS 10.5*  --   --   --   --  8.8*  HGB 12.6* 10.7* 10.6* 10.7* 10.8* 10.1*  HCT 40.9 35.5* 35.4* 35.2* 33.5* 30.9*  MCV 87.6 88.3 88.7 88.4 85.9 85.8  PLT 289 218 196 205 170 175   Basic Metabolic Panel:  Recent Labs Lab 07/16/17 2052 07/17/17 0336  07/18/17 0538 07/18/17 1451 07/18/17 2028 07/19/17 0228 07/19/17 1542  NA 154* 157*  < > 150* 150* 148* 148* 145  K 3.2* 3.7  < > 3.2* 3.0* 3.5 3.2* 3.4*  CL 129* >130*  < > 125* 123* 122* 122* 118*  CO2 19* 19*   < > 19* 21* 21* 21* 20*  GLUCOSE 124* 111*  < > 105* 131* 161* 99 127*  BUN 63* 56*  < > 43* 39* 37* 34* 32*  CREATININE 1.70* 1.64*  < > 1.39* 1.34* 1.39* 1.31* 1.13  CALCIUM 7.7* 7.9*  < > 7.9* 8.0* 7.8* 7.7* 7.6*  MG 1.6* 2.0  --   --   --   --  1.4*  --   PHOS 2.1* 2.1*  < > 2.0* 2.4* 2.7 2.5 2.8  < > = values in this interval not displayed. GFR: Estimated Creatinine Clearance: 59.3 mL/min (by C-G formula based on SCr of 1.13 mg/dL). Liver Function Tests:  Recent Labs Lab 07/15/17 1847  07/17/17 0336  07/18/17 0538 07/18/17 1451 07/18/17 2028 07/19/17 0228 07/19/17 1542  AST 19  --  18  --   --   --   --  14*  --   ALT 10*  --  9*  --   --   --   --  9*  --   ALKPHOS 67  --  65  --   --   --   --  63  --   BILITOT 1.0  --  0.9  --   --   --   --  0.6  --   PROT 7.1  --  6.0*  --   --   --   --  5.8*  --   ALBUMIN 2.7*  < > 2.4*  < > 2.3* 2.5* 2.3* 2.3* 2.3*  < > = values in this interval not displayed.  Recent Labs Lab 07/16/17 0343  LIPASE 55*   No results for input(s): AMMONIA in the last 168 hours. Coagulation Profile:  Recent Labs Lab 07/15/17 1847 07/17/17 0336  INR 1.24 1.24   Cardiac Enzymes:  Recent Labs Lab 07/15/17 2124 07/16/17 2052 07/17/17 0336  CKTOTAL 78  --   --   TROPONINI  --  <0.03 <0.03   BNP (last 3 results) No results for input(s): PROBNP in the last 8760 hours. HbA1C:  Recent Labs  07/16/17 2052  HGBA1C 5.9*   CBG:  Recent Labs Lab 07/18/17 2331 07/19/17 0351 07/19/17 0723 07/19/17 1139 07/19/17 1742  GLUCAP 141* 104* 105* 148* 133*   Lipid Profile: No results for input(s): CHOL, HDL, LDLCALC, TRIG, CHOLHDL, LDLDIRECT in the last 72 hours. Thyroid Function Tests:  Recent Labs  07/17/17 0336  TSH 3.036   Anemia Panel: No results for input(s): VITAMINB12, FOLATE, FERRITIN, TIBC, IRON, RETICCTPCT in the last 72 hours. Sepsis Labs:  Recent Labs Lab 07/15/17 2128 07/17/17 0336  LATICACIDVEN 0.66 0.9     Recent Results (from the past 240 hour(s))  Culture, blood (Routine x 2)     Status: None (Preliminary result)  Collection Time: 07/15/17  6:47 PM  Result Value Ref Range Status   Specimen Description BLOOD LEFT ARM  Final   Special Requests   Final    BOTTLES DRAWN AEROBIC AND ANAEROBIC Blood Culture adequate volume   Culture NO GROWTH 4 DAYS  Final   Report Status PENDING  Incomplete  Culture, blood (Routine x 2)     Status: None (Preliminary result)   Collection Time: 07/15/17  7:00 PM  Result Value Ref Range Status   Specimen Description BLOOD RIGHT FINGER  Final   Special Requests IN PEDIATRIC BOTTLE Blood Culture adequate volume  Final   Culture NO GROWTH 4 DAYS  Final   Report Status PENDING  Incomplete  Urine Culture     Status: Abnormal   Collection Time: 07/15/17  8:08 PM  Result Value Ref Range Status   Specimen Description URINE, RANDOM  Final   Special Requests NONE  Final   Culture (A)  Final    >=100,000 COLONIES/mL VANCOMYCIN RESISTANT ENTEROCOCCUS   Report Status 07/18/2017 FINAL  Final   Organism ID, Bacteria VANCOMYCIN RESISTANT ENTEROCOCCUS (A)  Final      Susceptibility   Vancomycin resistant enterococcus - MIC*    AMPICILLIN <=2 SENSITIVE Sensitive     LEVOFLOXACIN >=8 RESISTANT Resistant     NITROFURANTOIN <=16 SENSITIVE Sensitive     VANCOMYCIN >=32 RESISTANT Resistant     LINEZOLID 2 SENSITIVE Sensitive     * >=100,000 COLONIES/mL VANCOMYCIN RESISTANT ENTEROCOCCUS  MRSA PCR Screening     Status: None   Collection Time: 07/16/17  8:46 PM  Result Value Ref Range Status   MRSA by PCR NEGATIVE NEGATIVE Final    Comment:        The GeneXpert MRSA Assay (FDA approved for NASAL specimens only), is one component of a comprehensive MRSA colonization surveillance program. It is not intended to diagnose MRSA infection nor to guide or monitor treatment for MRSA infections.          Radiology Studies: Dg Abd Portable 1v  Result Date:  07/18/2017 CLINICAL DATA:  Gastrointestinal bleeding.  Abdominal pain. EXAM: PORTABLE ABDOMEN - 1 VIEW COMPARISON:  CT abdomen and pelvis 07/16/2017. FINDINGS: The bowel gas pattern is normal. No radio-opaque calculi or other significant radiographic abnormality are seen. IMPRESSION: Negative exam. Electronically Signed   By: Drusilla Kanner M.D.   On: 07/18/2017 09:13   Dg Swallowing Func-speech Pathology  Result Date: 07/18/2017 Objective Swallowing Evaluation: Type of Study: MBS-Modified Barium Swallow Study Patient Details Name: Gilbert Lewis MRN: [id number] Date of Birth: Apr 15, 1946 Today's Date: 07/18/2017 Time: SLP Start Time (ACUTE ONLY): 1413-SLP Stop Time (ACUTE ONLY): 1430 SLP Time Calculation (min) (ACUTE ONLY): 17 min Past Medical History: Past Medical History: Diagnosis Date . Cataract  . CKD (chronic kidney disease)  . Depression  . Diabetes mellitus without complication (HCC)  . Diabetic retinopathy (HCC)  . Hypertension  . Stroke [facility])  Past Surgical History: Past Surgical History: Procedure Laterality Date . APPENDECTOMY   . SKIN BIOPSY   HPI: Gilbert Lewis a 71 y.o.malewith medical history significant of hereditary spastic hemiplegia, CVA with resultant aphasia,DM2 , and HTN, history of ESBL infection. , HTN, DVT, DISH (Diffuse idiopathic skeletal hyperostosis), PEG after CVA admitted due to not eating or drinking x 1 week per family. Found to be hypernatremic and CT of abdomen showed urinary retention with bilateral hydronephrosis. 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 MBS 07/07/16 mild delay, UES residue, regular/thin recommended.  No Data Recorded Assessment / Plan / Recommendation CHL IP CLINICAL IMPRESSIONS 07/18/2017 Clinical Impression Prolonged mastication and transit with solid texture impacted by xerostomia and dried soft palate  secretions that SLP has been performing oral care/decontamination/infection control. Timing and coordination of swallow within functional limits. He exhibited decreased epiglottic inversion resulting in consistent vallecular residue (increased with solid texture). Verbal cues successful with second swallow significantly decreasing pharyngeal residue. Laryngeal penetration of straw sip thin remaining in vestibule and unable to produce volitional cough. Cup sips thin liquids did not intrude laryngeal vestibule however aspiration risk is high with dependent feeder, cognitive deficits and premorbid aphasia (receptive in addition to expressive?). Nectar consistency did not significantly increase pharyngeal residue. Recommend Dys 1 (puree), nectar thick liquids (should be able to upgrade at bedside with cups), no straws, crush pills, FULL supervision and assist, swallow twice. Continue intensive oral care. ST will continue to follow.  SLP Visit Diagnosis Dysphagia, oropharyngeal phase (R13.12) Attention and concentration deficit following -- Frontal lobe and executive function deficit following -- Impact on safety and function Moderate aspiration risk   CHL IP TREATMENT RECOMMENDATION 07/18/2017 Treatment Recommendations Therapy as outlined in treatment plan below   Prognosis 07/18/2017 Prognosis for Safe Diet Advancement (No Data) Barriers to Reach Goals Cognitive deficits Barriers/Prognosis Comment -- CHL IP DIET RECOMMENDATION 07/18/2017 SLP Diet Recommendations Dysphagia 1 (Puree) solids;Nectar thick liquid Liquid Administration via Cup;No straw Medication Administration Crushed with puree Compensations Slow rate;Small sips/bites;Multiple dry swallows after each bite/sip;Minimize environmental distractions Postural Changes Seated upright at 90 degrees   CHL IP OTHER RECOMMENDATIONS 07/18/2017 Recommended Consults -- Oral Care Recommendations Oral care BID Other Recommendations --   CHL IP FOLLOW UP RECOMMENDATIONS  07/18/2017 Follow up Recommendations Skilled Nursing facility   [facility] IP FREQUENCY AND DURATION 07/18/2017 Speech Therapy Frequency (ACUTE ONLY) min 2x/week Treatment Duration 2 weeks      CHL IP ORAL PHASE 07/18/2017 Oral Phase Impaired Oral - Pudding Teaspoon -- Oral - Pudding Cup -- Oral - Honey Teaspoon -- Oral - Honey Cup -- Oral - Nectar Teaspoon -- Oral - Nectar Cup -- Oral - Nectar Straw -- Oral - Thin Teaspoon -- Oral - Thin Cup -- Oral - Thin Straw -- Oral - Puree -- Oral - Mech Soft -- Oral - Regular Delayed oral transit;Weak lingual manipulation Oral - Multi-Consistency -- Oral - Pill -- Oral Phase - Comment --  CHL IP PHARYNGEAL PHASE 07/18/2017 Pharyngeal Phase Impaired Pharyngeal- Pudding Teaspoon -- Pharyngeal -- Pharyngeal- Pudding Cup -- Pharyngeal -- Pharyngeal- Honey Teaspoon -- Pharyngeal -- Pharyngeal- Honey Cup -- Pharyngeal -- Pharyngeal- Nectar Teaspoon -- Pharyngeal -- Pharyngeal- Nectar Cup Pharyngeal residue - valleculae;Reduced epiglottic inversion Pharyngeal -- Pharyngeal- Nectar Straw -- Pharyngeal -- Pharyngeal- Thin Teaspoon -- Pharyngeal -- Pharyngeal- Thin Cup Pharyngeal residue - valleculae;Pharyngeal residue - pyriform;Reduced epiglottic inversion;Reduced anterior laryngeal mobility Pharyngeal Material does not enter airway Pharyngeal- Thin Straw Penetration/Aspiration during swallow;Pharyngeal residue - valleculae;Pharyngeal residue - pyriform;Reduced epiglottic inversion;Reduced anterior laryngeal mobility Pharyngeal Material enters airway, remains ABOVE vocal cords and not ejected out Pharyngeal- Puree -- Pharyngeal -- Pharyngeal- Mechanical Soft -- Pharyngeal -- Pharyngeal- Regular Pharyngeal residue - valleculae;Reduced epiglottic inversion;Pharyngeal residue - pyriform;Reduced anterior laryngeal mobility Pharyngeal -- Pharyngeal- Multi-consistency -- Pharyngeal -- Pharyngeal- Pill -- Pharyngeal -- Pharyngeal Comment --  CHL IP CERVICAL ESOPHAGEAL PHASE 07/07/2016 Cervical  Esophageal Phase Impaired Pudding Teaspoon -- Pudding Cup -- Honey  Teaspoon -- Honey Cup -- Nectar Teaspoon -- Nectar Cup -- Nectar Straw -- Thin Teaspoon -- Thin Cup Reduced cricopharyngeal relaxation Thin Straw Reduced cricopharyngeal relaxation Puree Reduced cricopharyngeal relaxation Mechanical Soft -- Regular Reduced cricopharyngeal relaxation Multi-consistency -- Pill Reduced cricopharyngeal relaxation Cervical Esophageal Comment -- CHL IP GO 07/07/2016 Functional Assessment Tool Used skilled clinical judgment Functional Limitations Swallowing Swallow Current Status (Z6109) CI Swallow Goal Status (U0454) CI Swallow Discharge Status (U9811) CI Motor Speech Current Status (B1478) (None) Motor Speech Goal Status (G9562) (None) Motor Speech Goal Status (Z3086) (None) Spoken Language Comprehension Current Status (V7846) (None) Spoken Language Comprehension Goal Status (N6295) (None) Spoken Language Comprehension Discharge Status (M8413) (None) Spoken Language Expression Current Status (K4401) (None) Spoken Language Expression Goal Status (U2725) (None) Spoken Language Expression Discharge Status [phone]) (None) Attention Current Status (I3474) (None) Attention Goal Status (Q5956) (None) Attention Discharge Status (L8756) (None) Memory Current Status (E3329) (None) Memory Goal Status (J1884) (None) Memory Discharge Status (Z6606) (None) Voice Current Status (T0160) (None) Voice Goal Status (F0932) (None) Voice Discharge Status (T5573) (None) Other Speech-Language Pathology Functional Limitation Current Status (U2025) (None) Other Speech-Language Pathology Functional Limitation Goal Status (K2706) (None) Other Speech-Language Pathology Functional Limitation Discharge Status [phone]) (None) Royce Macadamia 07/18/2017, 3:23 PM Breck Coons [name].Ed CCC-SLP Pager [phone]                   Scheduled Meds: . chlorhexidine  15 mL Mouth Rinse BID  . feeding supplement (GLUCERNA SHAKE)  237 mL Oral TID BM  .  folic acid  1 mg Intravenous Daily  . insulin aspart  0-9 Units Subcutaneous Q4H  . mouth rinse  15 mL Mouth Rinse q12n4p  . multivitamin with minerals  1 tablet Oral Daily  . nystatin  5 mL Oral QID  . pantoprazole  40 mg Intravenous Q12H  . potassium chloride  20 mEq Oral Once  . tamsulosin  0.4 mg Oral QPC supper  . thiamine  100 mg Intravenous Daily   Continuous Infusions: . ampicillin (OMNIPEN) IV Stopped (07/19/17 1732)  . dextrose 75 mL/hr at 07/19/17 1000  . levETIRAcetam Stopped (07/19/17 1607)  . methocarbamol (ROBAXIN)  IV Stopped (07/19/17 0248)     LOS: 3 days     Jacquelin Hawking, MD [facility] 07/19/2017, 6:05 PM Pager: [phone]  If 7PM-7AM, please contact night-coverage [email or web address] Password [email or web address] 07/19/2017, 6:05 PM

## 2017-07-19 NOTE — Clinical Social Work Note (Signed)
CSW left voicemail for patient's daughter. [facility] when she returns call.  Charlynn CourtSarah Reise Hietala, CSW [phone]

## 2017-07-19 NOTE — Progress Notes (Signed)
Nutrition Follow-up  DOCUMENTATION CODES:   Non-severe (moderate) malnutrition in context of chronic illness  INTERVENTION:   -MVI daily -Magic Cup TID with meals -Glucerna Shake po TID, each supplement provides 220 kcal and 10 grams of protein  NUTRITION DIAGNOSIS:   Malnutrition (Moderate) related to chronic illness (CVA) as evidenced by mild depletion of body fat, mild depletion of muscle mass.  Ongoing  GOAL:   Patient will meet greater than or equal to 90% of their needs  Progressing  MONITOR:   Diet advancement, Labs, Weight trends, Skin, I & O's  REASON FOR ASSESSMENT:   Consult Assessment of nutrition requirement/status  ASSESSMENT:   Pt. with PMH of CVA, hemiplegia, aphasia, type II DM, HTN, DVT; admitted on 07/15/2017, presented with complaint of confusion and poor by mouth intake, was found to have hyponatremia, acute kidney injury, suspected sepsis.  10/6- s/p MBSS, advanced to dysphagia 1 diet with nectar thick liquids  Case discussed with RN and nurse tech, both report pt was able to tolerate small amounts of diet this morning. Noted meal completion 30%.   Spoke with pt, however, was more responsive today. He reports he is "sleepy" today. However, he reports he did not eat breakfast this morning and did not not provide coherent answers to RD questions.  Medications reviewed and include vitamin B-1.   Labs reviewed: Na: 148, K: 3.2, Mg: 1.4. Phos WDL. CBGS: 104-148 (inpatient orders for glycemic control are 0-9 units insulin aspart every 4 hours).   Diet Order:  DIET - DYS 1 Room service appropriate? Yes; Fluid consistency: Nectar Thick  Skin:   (stage 1 sacrum)  Last BM:  07/18/17  Height:   Ht Readings from Last 1 Encounters:  07/16/17 5\' 11"  (1.803 m)    Weight:   Wt Readings from Last 1 Encounters:  07/16/17 151 lb 14.4 oz (68.9 kg)    Ideal Body Weight:  78.2 kg  BMI:  Body mass index is 21.19 kg/m.  Estimated Nutritional Needs:    Kcal:  1850-2050  Protein:  90-105 grams  Fluid:  1.8-2.0 L  EDUCATION NEEDS:   Education needs no appropriate at this time  Lupe Bonner A. Mayford KnifeWilliams, RD, LDN, CDE Pager: [phone] After hours Pager: [phone]

## 2017-07-19 NOTE — Progress Notes (Signed)
Patient transferred from 2 C. Has red sacrum with foam. Foams on BUE with scratches. Ted hose in place heels and elbows intact.

## 2017-07-19 NOTE — Progress Notes (Signed)
K-3.2 Md aware with no order.

## 2017-07-20 DIAGNOSIS — Z872 Personal history of diseases of the skin and subcutaneous tissue: Secondary | ICD-10-CM

## 2017-07-20 DIAGNOSIS — Z1621 Resistance to vancomycin: Secondary | ICD-10-CM

## 2017-07-20 DIAGNOSIS — A491 Streptococcal infection, unspecified site: Secondary | ICD-10-CM

## 2017-07-20 LAB — GLUCOSE, CAPILLARY
GLUCOSE-CAPILLARY: 113 mg/dL — AB (ref 65–99)
GLUCOSE-CAPILLARY: 126 mg/dL — AB (ref 65–99)
GLUCOSE-CAPILLARY: 159 mg/dL — AB (ref 65–99)
GLUCOSE-CAPILLARY: 99 mg/dL (ref 65–99)
Glucose-Capillary: 144 mg/dL — ABNORMAL HIGH (ref 65–99)
Glucose-Capillary: 153 mg/dL — ABNORMAL HIGH (ref 65–99)

## 2017-07-20 LAB — RENAL FUNCTION PANEL
ALBUMIN: 2.2 g/dL — AB (ref 3.5–5.0)
ALBUMIN: 2.5 g/dL — AB (ref 3.5–5.0)
ANION GAP: 6 (ref 5–15)
Albumin: 2.3 g/dL — ABNORMAL LOW (ref 3.5–5.0)
Anion gap: 7 (ref 5–15)
Anion gap: 7 (ref 5–15)
BUN: 29 mg/dL — ABNORMAL HIGH (ref 6–20)
BUN: 27 mg/dL — AB (ref 6–20)
BUN: 27 mg/dL — AB (ref 6–20)
CALCIUM: 7.8 mg/dL — AB (ref 8.9–10.3)
CALCIUM: 7.6 mg/dL — AB (ref 8.9–10.3)
CHLORIDE: 114 mmol/L — AB (ref 101–111)
CHLORIDE: 113 mmol/L — AB (ref 101–111)
CO2: 22 mmol/L (ref 22–32)
CO2: 19 mmol/L — ABNORMAL LOW (ref 22–32)
CO2: 22 mmol/L (ref 22–32)
CREATININE: 1.19 mg/dL (ref 0.61–1.24)
Calcium: 7.7 mg/dL — ABNORMAL LOW (ref 8.9–10.3)
Chloride: 116 mmol/L — ABNORMAL HIGH (ref 101–111)
Creatinine, Ser: 1.19 mg/dL (ref 0.61–1.24)
Creatinine, Ser: 1.25 mg/dL — ABNORMAL HIGH (ref 0.61–1.24)
GFR calc Af Amer: >60 mL/min (ref >60)
GFR calc non Af Amer: >60 mL/min (ref >60)
GFR, EST NON AFRICAN AMERICAN: 57 mL/min — AB (ref >60)
GLUCOSE: 100 mg/dL — AB (ref 65–99)
GLUCOSE: 169 mg/dL — AB (ref 65–99)
Glucose, Bld: 134 mg/dL — ABNORMAL HIGH (ref 65–99)
PHOSPHORUS: 2.4 mg/dL — AB (ref 2.5–4.6)
POTASSIUM: 3.5 mmol/L (ref 3.5–5.1)
POTASSIUM: 3.5 mmol/L (ref 3.5–5.1)
Phosphorus: 2.8 mg/dL (ref 2.5–4.6)
Phosphorus: 2.7 mg/dL (ref 2.5–4.6)
Potassium: 3.5 mmol/L (ref 3.5–5.1)
SODIUM: 144 mmol/L (ref 135–145)
Sodium: 140 mmol/L (ref 135–145)
Sodium: 142 mmol/L (ref 135–145)

## 2017-07-20 LAB — CULTURE, BLOOD (ROUTINE X 2)
Culture: NO GROWTH
Culture: NO GROWTH
SPECIAL REQUESTS: ADEQUATE
Special Requests: ADEQUATE

## 2017-07-20 LAB — PROTIME-INR
INR: 1.13
Prothrombin Time: 14.5 seconds (ref 11.4–15.2)

## 2017-07-20 MED ORDER — PANTOPRAZOLE SODIUM 40 MG PO PACK
40.0000 mg | PACK | Freq: Two times a day (BID) | ORAL | Status: DC
  Administered 2017-07-20 – 2017-07-23 (×6): 40 mg via ORAL
  Filled 2017-07-20 (×6): qty 20

## 2017-07-20 MED ORDER — AMOXICILLIN 250 MG PO CHEW
500.0000 mg | CHEWABLE_TABLET | Freq: Three times a day (TID) | ORAL | Status: DC
  Filled 2017-07-20: qty 2

## 2017-07-20 MED ORDER — PANTOPRAZOLE SODIUM 40 MG PO TBEC: 40.0000 mg | DELAYED_RELEASE_TABLET | Freq: Two times a day (BID) | ORAL | Status: DC

## 2017-07-20 MED ORDER — WARFARIN SODIUM 5 MG PO TABS
5.0000 mg | ORAL_TABLET | Freq: Once | ORAL | Status: AC
  Administered 2017-07-20: 5 mg via ORAL
  Filled 2017-07-20: qty 1

## 2017-07-20 MED ORDER — WARFARIN - PHARMACIST DOSING INPATIENT
Freq: Every day | Status: DC
  Administered 2017-07-21: 19:00:00

## 2017-07-20 MED ORDER — LEVETIRACETAM 250 MG PO TABS: 250.0000 mg | ORAL_TABLET | Freq: Two times a day (BID) | ORAL | Status: DC

## 2017-07-20 MED ORDER — AMOXICILLIN 500 MG PO CAPS
500.0000 mg | ORAL_CAPSULE | Freq: Three times a day (TID) | ORAL | Status: DC
  Administered 2017-07-20 – 2017-07-23 (×10): 500 mg via ORAL
  Filled 2017-07-20 (×11): qty 1

## 2017-07-20 MED ORDER — ENOXAPARIN SODIUM 80 MG/0.8ML ~~LOC~~ SOLN
1.0000 mg/kg | Freq: Two times a day (BID) | SUBCUTANEOUS | Status: DC
  Administered 2017-07-20 – 2017-07-23 (×6): 70 mg via SUBCUTANEOUS
  Filled 2017-07-20 (×6): qty 0.8

## 2017-07-20 MED ORDER — FOLIC ACID 1 MG PO TABS
1.0000 mg | ORAL_TABLET | Freq: Every day | ORAL | Status: DC
  Administered 2017-07-21 – 2017-07-23 (×3): 1 mg via ORAL
  Filled 2017-07-20 (×3): qty 1

## 2017-07-20 MED ORDER — LEVETIRACETAM 100 MG/ML PO SOLN
250.0000 mg | Freq: Two times a day (BID) | ORAL | Status: DC
  Administered 2017-07-20 – 2017-07-23 (×6): 250 mg via ORAL
  Filled 2017-07-20 (×6): qty 5

## 2017-07-20 MED ORDER — VITAMIN B-1 100 MG PO TABS
100.0000 mg | ORAL_TABLET | Freq: Every day | ORAL | Status: DC
  Administered 2017-07-21 – 2017-07-23 (×3): 100 mg via ORAL
  Filled 2017-07-20 (×3): qty 1

## 2017-07-20 MED ORDER — METHOCARBAMOL 500 MG PO TABS
500.0000 mg | ORAL_TABLET | Freq: Three times a day (TID) | ORAL | Status: DC | PRN
  Administered 2017-07-21: 500 mg via ORAL
  Filled 2017-07-20: qty 1

## 2017-07-20 NOTE — Clinical Social Work Note (Signed)
Clinical Social Work Assessment  Patient Details  Name: Gilbert Lewis MRN: [id number] Date of Birth: 1946/07/09  Date of referral:  07/20/17               Reason for consult:  Facility Placement                Permission sought to share information with:  Oceanographeracility Contact Representative Permission granted to share information::  Yes, Verbal Permission Granted  Name::     Magazine features editorAthena  Agency::  SNF  Relationship::  dtr  Contact Information:     Housing/Transportation Living arrangements for the past 2 months:  [facility] Source of Information:  Adult Children Patient Interpreter Needed:  None Criminal Activity/Legal Involvement Pertinent to Current Situation/Hospitalization:  No - Comment as needed Significant Relationships:  Adult Children Lives with:  Adult Children Do you feel safe going back to the place where you live?  Yes Need for family participation in patient care:  Yes (Comment) (decision making)  Care giving concerns:  Pt lives at SNF- some concerns about proper communication with SNF which family believes contributed to readmission.  Social Worker assessment / plan:  CSW spoke with pt dtr concerning plan for time of DC.  Pt dtr confirms pt is LTC resident at [facility].  Employment status:  Retired Database administratornsurance information:  Managed Medicare PT Recommendations:  Skilled Nursing Facility Information / Referral to community resources:  Skilled Nursing Facility  Patient/Family's Response to care:  Pt dtr is agreeable to pt return to [facility] so that pt will not be disoriented by transfer but is hopeful that clear instruction can be provided to SNF.  Dtr also interested in hospice following at SNF.  Patient/Family's Understanding of and Emotional Response to Diagnosis, Current Treatment, and Prognosis:  Family is realistic about needs for time of DC and hopeful for smooth transition back to SNF.  Emotional Assessment Appearance:  Appears stated  age Attitude/Demeanor/Rapport:  Unable to Assess Affect (typically observed):  Unable to Assess Orientation:  Oriented to Self Alcohol / Substance use:  Not Applicable Psych involvement (Current and /or in the community):  No (Comment)  Discharge Needs  Concerns to be addressed:  Care Coordination Readmission within the last 30 days:  Yes Current discharge risk:  None Barriers to Discharge:  Continued Medical Work up   Burna SisUris, [name], LCSW 07/20/2017, 2:14 PM

## 2017-07-20 NOTE — Progress Notes (Addendum)
ANTICOAGULATION CONSULT NOTE - Initial Consult  Pharmacy Consult for warfarin restart with Lovenox bridge Indication: hx of DVT and chronic DVT  Allergies  Allergen Reactions  . Latex Other (See Comments)    Per MAR  . Macrobid [employer][Nitrofurantoin Monohyd Macro] Other (See Comments)    Per MAR  . Morphine And Related Other (See Comments)    [facility]    Patient Measurements: Height: 5\' 11"  (180.3 cm) Weight: 151 lb 14.4 oz (68.9 kg) IBW/kg (Calculated) : 75.3  Vital Signs: Temp: 98 F (36.7 C) (10/18 0659) Temp Source: Oral (10/18 0659) BP: 128/67 (10/18 0659)  Labs:  Recent Labs  07/18/17 0538  07/19/17 0228 07/19/17 1542 07/19/17 2023 07/20/17 0502  HGB 10.8*  --  10.1*  --   --   --   HCT 33.5*  --  30.9*  --   --   --   PLT 170  --  175  --   --   --   CREATININE 1.39*  < > 1.31* 1.13 1.25* 1.19  < > = values in this interval not displayed.  Estimated Creatinine Clearance: 56.3 mL/min (by C-G formula based on SCr of 1.19 mg/dL).   Medical History: Past Medical History:  Diagnosis Date  . Cataract   . CKD (chronic kidney disease)   . Depression   . Diabetes mellitus without complication (HCC)   . Diabetic retinopathy (HCC)   . Hypertension   . Stroke [facility](HCC)     Assessment: 71yo male on warfarin PTA for hx DVT (Pt has history of DVT on Xarelto), warfarin was held at nursing home x 1 week PTA. Patient was not eating or drinking and had blood in his stool. Per nursing home records, patient received vitamin K IM doses 2.5mg  on 10/8 and 5 mg on 10/10 d/t INR of 7.2. INR on admission 10/13 was 1.24, and he was Hemoccult positive. Pt was NPO on 10/15, now changed to a dysphagia nectar thick liquid diet and PO intake 20-50%. No evidence of continued bleeding per medical record. Patient will likely require a much lower warfarin dose than PTA d/t drastic change in diet and PO intake.  PTA dose: warfarin 5 mg on Sat/Sun and 4 mg all other days (per nursing home)  Goal  of Therapy:  INR 2-3 Monitor platelets by anticoagulation protocol: Yes   Plan:  Give warfarin 5 mg po x 1 Start Lovenox 1 mg/kg q12 hours x 5 days and 2 therapeutic INRs Check INR now Monitor daily INR, CBC, clinical course, s/sx of bleed, PO intake, DDI   Thank you for allowing us to participate in this patients care.  Signe Coltonya C Sharlyne Koeneman, PharmD Clinical phone for 07/20/2017 from 7a-3:30p: [phone] If after 3:30p, please call main pharmacy at: [phone] 07/20/2017 1:28 PM

## 2017-07-20 NOTE — Progress Notes (Signed)
This RN was notified by central telemetry that the pt had a 6 beat run of SVT. RN reviewed strip with a second RN Greg CutterKaelin and felt was a run of V-tach. Pt assessed and is asymptomatic, resting comfortably in bed, no complaints of CP. MD notified and awaiting any additional orders. Will ctm.

## 2017-07-20 NOTE — Progress Notes (Signed)
PROGRESS NOTE    Gilbert Lewis  ION:[id number] DOB: Jan 11, 1946 DOA: 07/15/2017 PCP: Patient, No Pcp Per   Brief Narrative: Gilbert Lewis is a 71 y.o. with a PMH of CVA, hemiplegia, aphasia, type II DM, HTN, DVT; admitted on 07/15/2017, presented with complaint of confusion and poor by mouth intake, was found to have hyponatremia, acute kidney injury, suspected sepsis. Currently further plan is continue his IV antibiotics and IV fluids.   Assessment & Plan:   Active Problems:   Aphasia, late effect of cerebrovascular disease   Anemia, iron deficiency   CKD (chronic kidney disease) stage 3, GFR 30-59 ml/min (HCC)   Pressure ulcer   Diabetes mellitus with complication (HCC)   History of pressure ulcer   Moderate protein-calorie malnutrition (HCC)   Essential hypertension   Acute lower UTI   Melena   Sepsis (HCC)   Hypernatremia   Hypokalemia   Dehydration   AKI (acute kidney injury) (HCC)   GI bleed   Acute urinary retention   Sepsis secondary to UTI VRE UTI -discontinue ampicillin -transition to amoxicillin  GI bleed Hemoccult positive. Hemoglobin stable. GI consulted without recommendations currently. No evidence of continued bleeding. -restart coumadin with Lovenox bridge  Hypernatremia Likely secondary to poor PO intake. Resolved. -oral nutrition intake  Acute kidney injury on CKD stage III Resolved.  Hypokalemia Resolved with repletion.  Metabolic acidosis Likely secondary to acute kidney injury. Improving.  Type 2 diabetes mellitus Controlled. -continue SSI  Essential hypertension Normotensive  Acute urinary retention Secondary to UTI. S/p urethral catheter -will need voiding trial prior to discharge  Right lower leg edema Repeat doppler significant for chronic DVT  History of seizure -continue Keppra  Dysphagia Secondary to history of stroke.    DVT prophylaxis: Coumadin with Lovenox bridge Code Status: Full code Family Communication:  None at bedside Disposition Plan: Discharge when medically stable   Consultants:   Gastroenterology  Procedures:   None  Antimicrobials:  Vancomycin  Zosyn  Meropenem  Ampicillin  Amoxicillin   Subjective: Afebrile. No concerns  Objective: Vitals:   07/19/17 1705 07/19/17 1853 07/19/17 2110 07/20/17 0659  BP: 127/72 100/61 129/67 128/67  Pulse:  92 100   Resp: 15  19   Temp: 98.1 F (36.7 C) 99.1 F (37.3 C) 98 F (36.7 C) 98 F (36.7 C)  TempSrc: Oral Oral  Oral  SpO2: 99% 98% 98% 97%  Weight:      Height:        Intake/Output Summary (Last 24 hours) at 07/20/17 1319 Last data filed at 07/20/17 1004  Gross per 24 hour  Intake              673 ml  Output             1575 ml  Net             -902 ml   Filed Weights   07/15/17 1838 07/16/17 2016  Weight: 72.6 kg (160 lb) 68.9 kg (151 lb 14.4 oz)    Examination:  General exam: Appears calm and comfortable Respiratory system: Clear to auscultation. Respiratory effort normal. Cardiovascular system: S1 & S2 heard, RRR. No murmurs. Gastrointestinal system: Abdomen is nondistended, soft and nontender. No organomegaly or masses felt. Normal bowel sounds heard. Central nervous system: Alert, aphasic Extremities: No calf tenderness    Data Reviewed: I have personally reviewed following labs and imaging studies  CBC:  Recent Labs Lab 07/15/17 1847 07/16/17 0343 07/16/17 1437 07/16/17 2052 07/18/17 2440  07/19/17 0228  WBC 14.2* 12.3* 11.3* 12.7* 13.6* 12.2*  NEUTROABS 10.5*  --   --   --   --  8.8*  HGB 12.6* 10.7* 10.6* 10.7* 10.8* 10.1*  HCT 40.9 35.5* 35.4* 35.2* 33.5* 30.9*  MCV 87.6 88.3 88.7 88.4 85.9 85.8  PLT 289 218 196 205 170 175   Basic Metabolic Panel:  Recent Labs Lab 07/16/17 2052 07/17/17 0336  07/18/17 2028 07/19/17 0228 07/19/17 1542 07/19/17 2023 07/20/17 0502  NA 154* 157*  < > 148* 148* 145 140 144  K 3.2* 3.7  < > 3.5 3.2* 3.4* 3.2* 3.5  CL 129* >130*  < >  122* 122* 118* 115* 116*  CO2 19* 19*  < > 21* 21* 20* 19* 22  GLUCOSE 124* 111*  < > 161* 99 127* 154* 100*  BUN 63* 56*  < > 37* 34* 32* 32* 29*  CREATININE 1.70* 1.64*  < > 1.39* 1.31* 1.13 1.25* 1.19  CALCIUM 7.7* 7.9*  < > 7.8* 7.7* 7.6* 7.4* 7.8*  MG 1.6* 2.0  --   --  1.4*  --   --   --   PHOS 2.1* 2.1*  < > 2.7 2.5 2.8 2.7 2.4*  < > = values in this interval not displayed. GFR: Estimated Creatinine Clearance: 56.3 mL/min (by C-G formula based on SCr of 1.19 mg/dL). Liver Function Tests:  Recent Labs Lab 07/15/17 1847  07/17/17 0336  07/18/17 2028 07/19/17 0228 07/19/17 1542 07/19/17 2023 07/20/17 0502  AST 19  --  18  --   --  14*  --   --   --   ALT 10*  --  9*  --   --  9*  --   --   --   ALKPHOS 67  --  65  --   --  63  --   --   --   BILITOT 1.0  --  0.9  --   --  0.6  --   --   --   PROT 7.1  --  6.0*  --   --  5.8*  --   --   --   ALBUMIN 2.7*  < > 2.4*  < > 2.3* 2.3* 2.3* 2.3* 2.2*  < > = values in this interval not displayed.  Recent Labs Lab 07/16/17 0343  LIPASE 55*   No results for input(s): AMMONIA in the last 168 hours. Coagulation Profile:  Recent Labs Lab 07/15/17 1847 07/17/17 0336  INR 1.24 1.24   Cardiac Enzymes:  Recent Labs Lab 07/15/17 2124 07/16/17 2052 07/17/17 0336  CKTOTAL 78  --   --   TROPONINI  --  <0.03 <0.03   BNP (last 3 results) No results for input(s): PROBNP in the last 8760 hours. HbA1C: No results for input(s): HGBA1C in the last 72 hours. CBG:  Recent Labs Lab 07/19/17 1742 07/19/17 1959 07/20/17 0014 07/20/17 0749 07/20/17 1201  GLUCAP 133* 144* 113* 99 126*   Lipid Profile: No results for input(s): CHOL, HDL, LDLCALC, TRIG, CHOLHDL, LDLDIRECT in the last 72 hours. Thyroid Function Tests: No results for input(s): TSH, T4TOTAL, FREET4, T3FREE, THYROIDAB in the last 72 hours. Anemia Panel: No results for input(s): VITAMINB12, FOLATE, FERRITIN, TIBC, IRON, RETICCTPCT in the last 72 hours. Sepsis  Labs:  Recent Labs Lab 07/15/17 2128 07/17/17 0336  LATICACIDVEN 0.66 0.9    Recent Results (from the past 240 hour(s))  Culture, blood (Routine x 2)  Status: None (Preliminary result)   Collection Time: 07/15/17  6:47 PM  Result Value Ref Range Status   Specimen Description BLOOD LEFT ARM  Final   Special Requests   Final    BOTTLES DRAWN AEROBIC AND ANAEROBIC Blood Culture adequate volume   Culture NO GROWTH 4 DAYS  Final   Report Status PENDING  Incomplete  Culture, blood (Routine x 2)     Status: None (Preliminary result)   Collection Time: 07/15/17  7:00 PM  Result Value Ref Range Status   Specimen Description BLOOD RIGHT FINGER  Final   Special Requests IN PEDIATRIC BOTTLE Blood Culture adequate volume  Final   Culture NO GROWTH 4 DAYS  Final   Report Status PENDING  Incomplete  Urine Culture     Status: Abnormal   Collection Time: 07/15/17  8:08 PM  Result Value Ref Range Status   Specimen Description URINE, RANDOM  Final   Special Requests NONE  Final   Culture (A)  Final    >=100,000 COLONIES/mL VANCOMYCIN RESISTANT ENTEROCOCCUS   Report Status 07/18/2017 FINAL  Final   Organism ID, Bacteria VANCOMYCIN RESISTANT ENTEROCOCCUS (A)  Final      Susceptibility   Vancomycin resistant enterococcus - MIC*    AMPICILLIN <=2 SENSITIVE Sensitive     LEVOFLOXACIN >=8 RESISTANT Resistant     NITROFURANTOIN <=16 SENSITIVE Sensitive     VANCOMYCIN >=32 RESISTANT Resistant     LINEZOLID 2 SENSITIVE Sensitive     * >=100,000 COLONIES/mL VANCOMYCIN RESISTANT ENTEROCOCCUS  MRSA PCR Screening     Status: None   Collection Time: 07/16/17  8:46 PM  Result Value Ref Range Status   MRSA by PCR NEGATIVE NEGATIVE Final    Comment:        The GeneXpert MRSA Assay (FDA approved for NASAL specimens only), is one component of a comprehensive MRSA colonization surveillance program. It is not intended to diagnose MRSA infection nor to guide or monitor treatment for MRSA  infections.          Radiology Studies: Dg Swallowing Func-speech Pathology  Result Date: 07/18/2017 Objective Swallowing Evaluation: Type of Study: MBS-Modified Barium Swallow Study Patient Details Name: Gilbert Lewis MRN: [id number] Date of Birth: [birth year] Today's Date: 07/18/2017 Time: SLP Start Time (ACUTE ONLY): 1413-SLP Stop Time (ACUTE ONLY): 1430 SLP Time Calculation (min) (ACUTE ONLY): 17 min Past Medical History: Past Medical History: Diagnosis Date . Cataract  . CKD (chronic kidney disease)  . Depression  . Diabetes mellitus without complication (HCC)  . Diabetic retinopathy (HCC)  . Hypertension  . Stroke [facility])  Past Surgical History: Past Surgical History: Procedure Laterality Date . APPENDECTOMY   . SKIN BIOPSY   HPI: Gilbert Lewis a 71 y.o.malewith medical history significant of hereditary spastic hemiplegia, CVA with resultant aphasia,DM2 , and HTN, history of ESBL infection. , HTN, DVT, DISH (Diffuse idiopathic skeletal hyperostosis), PEG after CVA admitted due to not eating or drinking x 1 week per family. Found to be hypernatremic and CT of abdomen showed urinary retention with bilateral hydronephrosis. 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 MBS 07/07/16 mild delay, UES residue, regular/thin recommended.  No Data Recorded Assessment / Plan / Recommendation CHL IP CLINICAL IMPRESSIONS 07/18/2017 Clinical Impression Prolonged mastication and transit with solid texture impacted by xerostomia and dried soft palate secretions that SLP has been performing oral care/decontamination/infection control. Timing and  coordination of swallow within functional limits. He exhibited decreased epiglottic inversion resulting in consistent vallecular residue (increased with solid texture). Verbal cues successful with second swallow significantly  decreasing pharyngeal residue. Laryngeal penetration of straw sip thin remaining in vestibule and unable to produce volitional cough. Cup sips thin liquids did not intrude laryngeal vestibule however aspiration risk is high with dependent feeder, cognitive deficits and premorbid aphasia (receptive in addition to expressive?). Nectar consistency did not significantly increase pharyngeal residue. Recommend Dys 1 (puree), nectar thick liquids (should be able to upgrade at bedside with cups), no straws, crush pills, FULL supervision and assist, swallow twice. Continue intensive oral care. ST will continue to follow.  SLP Visit Diagnosis Dysphagia, oropharyngeal phase (R13.12) Attention and concentration deficit following -- Frontal lobe and executive function deficit following -- Impact on safety and function Moderate aspiration risk   CHL IP TREATMENT RECOMMENDATION 07/18/2017 Treatment Recommendations Therapy as outlined in treatment plan below   Prognosis 07/18/2017 Prognosis for Safe Diet Advancement (No Data) Barriers to Reach Goals Cognitive deficits Barriers/Prognosis Comment -- CHL IP DIET RECOMMENDATION 07/18/2017 SLP Diet Recommendations Dysphagia 1 (Puree) solids;Nectar thick liquid Liquid Administration via Cup;No straw Medication Administration Crushed with puree Compensations Slow rate;Small sips/bites;Multiple dry swallows after each bite/sip;Minimize environmental distractions Postural Changes Seated upright at 90 degrees   CHL IP OTHER RECOMMENDATIONS 07/18/2017 Recommended Consults -- Oral Care Recommendations Oral care BID Other Recommendations --   CHL IP FOLLOW UP RECOMMENDATIONS 07/18/2017 Follow up Recommendations Skilled Nursing facility   [facility] IP FREQUENCY AND DURATION 07/18/2017 Speech Therapy Frequency (ACUTE ONLY) min 2x/week Treatment Duration 2 weeks      CHL IP ORAL PHASE 07/18/2017 Oral Phase Impaired Oral - Pudding Teaspoon -- Oral - Pudding Cup -- Oral - Honey Teaspoon -- Oral - Honey  Cup -- Oral - Nectar Teaspoon -- Oral - Nectar Cup -- Oral - Nectar Straw -- Oral - Thin Teaspoon -- Oral - Thin Cup -- Oral - Thin Straw -- Oral - Puree -- Oral - Mech Soft -- Oral - Regular Delayed oral transit;Weak lingual manipulation Oral - Multi-Consistency -- Oral - Pill -- Oral Phase - Comment --  CHL IP PHARYNGEAL PHASE 07/18/2017 Pharyngeal Phase Impaired Pharyngeal- Pudding Teaspoon -- Pharyngeal -- Pharyngeal- Pudding Cup -- Pharyngeal -- Pharyngeal- Honey Teaspoon -- Pharyngeal -- Pharyngeal- Honey Cup -- Pharyngeal -- Pharyngeal- Nectar Teaspoon -- Pharyngeal -- Pharyngeal- Nectar Cup Pharyngeal residue - valleculae;Reduced epiglottic inversion Pharyngeal -- Pharyngeal- Nectar Straw -- Pharyngeal -- Pharyngeal- Thin Teaspoon -- Pharyngeal -- Pharyngeal- Thin Cup Pharyngeal residue - valleculae;Pharyngeal residue - pyriform;Reduced epiglottic inversion;Reduced anterior laryngeal mobility Pharyngeal Material does not enter airway Pharyngeal- Thin Straw Penetration/Aspiration during swallow;Pharyngeal residue - valleculae;Pharyngeal residue - pyriform;Reduced epiglottic inversion;Reduced anterior laryngeal mobility Pharyngeal Material enters airway, remains ABOVE vocal cords and not ejected out Pharyngeal- Puree -- Pharyngeal -- Pharyngeal- Mechanical Soft -- Pharyngeal -- Pharyngeal- Regular Pharyngeal residue - valleculae;Reduced epiglottic inversion;Pharyngeal residue - pyriform;Reduced anterior laryngeal mobility Pharyngeal -- Pharyngeal- Multi-consistency -- Pharyngeal -- Pharyngeal- Pill -- Pharyngeal -- Pharyngeal Comment --  CHL IP CERVICAL ESOPHAGEAL PHASE 07/07/2016 Cervical Esophageal Phase Impaired Pudding Teaspoon -- Pudding Cup -- Honey Teaspoon -- Honey Cup -- Nectar Teaspoon -- Nectar Cup -- Nectar Straw -- Thin Teaspoon -- Thin Cup Reduced cricopharyngeal relaxation Thin Straw Reduced cricopharyngeal relaxation Puree Reduced cricopharyngeal relaxation Mechanical Soft -- Regular Reduced  cricopharyngeal relaxation Multi-consistency -- Pill Reduced cricopharyngeal relaxation Cervical Esophageal Comment -- CHL IP GO 07/07/2016 Functional Assessment Tool Used skilled clinical judgment  Functional Limitations Swallowing Swallow Current Status [phone]([phone]) CI Swallow Goal Status (U0454(G8997) CI Swallow Discharge Status [phone]([phone]) CI Motor Speech Current Status [phone](G8999) (None) Motor Speech Goal Status [phone](G9186) (None) Motor Speech Goal Status (Z3086(G9158) (None) Spoken Language Comprehension Current Status [phone](G9159) (None) Spoken Language Comprehension Goal Status (N6295(G9160) (None) Spoken Language Comprehension Discharge Status [phone](G9161) (None) Spoken Language Expression Current Status [phone]([phone]) (None) Spoken Language Expression Goal Status [phone](G9163) (None) Spoken Language Expression Discharge Status [phone](G9164) (None) Attention Current Status (I3474(G9165) (None) Attention Goal Status (Q5956(G9166) (None) Attention Discharge Status [phone](G9167) (None) Memory Current Status (E3329(G9168) (None) Memory Goal Status (J1884(G9169) (None) Memory Discharge Status (Z6606(G9170) (None) Voice Current Status (T0160(G9171) (None) Voice Goal Status (F0932(G9172) (None) Voice Discharge Status (T5573(G9173) (None) Other Speech-Language Pathology Functional Limitation Current Status (U2025(G9174) (None) Other Speech-Language Pathology Functional Limitation Goal Status (K2706(G9175) (None) Other Speech-Language Pathology Functional Limitation Discharge Status [phone]([phone]) (None) Royce MacadamiaLitaker, Lisa Willis 07/18/2017, 3:23 PM Breck CoonsLisa Willis Lonell FaceLitaker M.Ed CCC-SLP Pager [phone]                   Scheduled Meds: . amoxicillin  500 mg Oral Q8H  . chlorhexidine  15 mL Mouth Rinse BID  . feeding supplement (GLUCERNA SHAKE)  237 mL Oral TID BM  . folic acid  1 mg Intravenous Daily  . insulin aspart  0-5 Units Subcutaneous QHS  . insulin aspart  0-9 Units Subcutaneous TID WC  . levETIRAcetam  250 mg Oral BID  . mouth rinse  15 mL Mouth Rinse q12n4p  . multivitamin with minerals  1 tablet Oral Daily  . nystatin  5 mL Oral QID   . pantoprazole  40 mg Oral BID  . tamsulosin  0.4 mg Oral QPC supper  . [START ON 07/21/2017] thiamine  100 mg Oral Daily   Continuous Infusions: . dextrose 75 mL/hr at 07/19/17 2348     LOS: 4 days     Jacquelin Hawkingalph Ardie Mclennan, MD [facility] 07/20/2017, 1:19 PM Pager: [phone]  If 7PM-7AM, please contact night-coverage [email or web address] Password [email or web address] 07/20/2017, 1:19 PM

## 2017-07-21 LAB — RENAL FUNCTION PANEL
Albumin: 2.3 g/dL — ABNORMAL LOW (ref 3.5–5.0)
Albumin: 2.3 g/dL — ABNORMAL LOW (ref 3.5–5.0)
Albumin: 2.3 g/dL — ABNORMAL LOW (ref 3.5–5.0)
Anion gap: 7 (ref 5–15)
Anion gap: 6 (ref 5–15)
Anion gap: 7 (ref 5–15)
BUN: 28 mg/dL — ABNORMAL HIGH (ref 6–20)
BUN: 28 mg/dL — ABNORMAL HIGH (ref 6–20)
BUN: 28 mg/dL — ABNORMAL HIGH (ref 6–20)
CALCIUM: 7.8 mg/dL — AB (ref 8.9–10.3)
CO2: 23 mmol/L (ref 22–32)
CO2: 23 mmol/L (ref 22–32)
CO2: 21 mmol/L — ABNORMAL LOW (ref 22–32)
CREATININE: 1.2 mg/dL (ref 0.61–1.24)
Calcium: 7.8 mg/dL — ABNORMAL LOW (ref 8.9–10.3)
Calcium: 7.7 mg/dL — ABNORMAL LOW (ref 8.9–10.3)
Chloride: 112 mmol/L — ABNORMAL HIGH (ref 101–111)
Chloride: 113 mmol/L — ABNORMAL HIGH (ref 101–111)
Chloride: 110 mmol/L (ref 101–111)
Creatinine, Ser: 1.2 mg/dL (ref 0.61–1.24)
Creatinine, Ser: 1.23 mg/dL (ref 0.61–1.24)
GFR calc Af Amer: >60 mL/min (ref >60)
GFR calc Af Amer: >60 mL/min (ref >60)
GFR calc non Af Amer: 60 mL/min — ABNORMAL LOW (ref >60)
GFR calc non Af Amer: 60 mL/min — ABNORMAL LOW (ref >60)
GFR calc non Af Amer: 58 mL/min — ABNORMAL LOW (ref >60)
Glucose, Bld: 107 mg/dL — ABNORMAL HIGH (ref 65–99)
Glucose, Bld: 166 mg/dL — ABNORMAL HIGH (ref 65–99)
Glucose, Bld: 153 mg/dL — ABNORMAL HIGH (ref 65–99)
Phosphorus: 3.4 mg/dL (ref 2.5–4.6)
Phosphorus: 3.8 mg/dL (ref 2.5–4.6)
Phosphorus: 3.2 mg/dL (ref 2.5–4.6)
Potassium: 3.2 mmol/L — ABNORMAL LOW (ref 3.5–5.1)
Potassium: 3.5 mmol/L (ref 3.5–5.1)
Potassium: 4.1 mmol/L (ref 3.5–5.1)
SODIUM: 142 mmol/L (ref 135–145)
Sodium: 142 mmol/L (ref 135–145)
Sodium: 138 mmol/L (ref 135–145)

## 2017-07-21 LAB — CBC
HCT: 27 % — ABNORMAL LOW (ref 39.0–52.0)
Hemoglobin: 8.7 g/dL — ABNORMAL LOW (ref 13.0–17.0)
MCH: 27 pg (ref 26.0–34.0)
MCHC: 32.2 g/dL (ref 30.0–36.0)
MCV: 83.9 fL (ref 78.0–100.0)
Platelets: 232 10*3/uL (ref 150–400)
RBC: 3.22 MIL/uL — ABNORMAL LOW (ref 4.22–5.81)
RDW: 13.9 % (ref 11.5–15.5)
WBC: 9.6 10*3/uL (ref 4.0–10.5)

## 2017-07-21 LAB — GLUCOSE, CAPILLARY
GLUCOSE-CAPILLARY: 173 mg/dL — AB (ref 65–99)
Glucose-Capillary: 105 mg/dL — ABNORMAL HIGH (ref 65–99)
Glucose-Capillary: 157 mg/dL — ABNORMAL HIGH (ref 65–99)
Glucose-Capillary: 187 mg/dL — ABNORMAL HIGH (ref 65–99)

## 2017-07-21 LAB — MAGNESIUM: Magnesium: 1.5 mg/dL — ABNORMAL LOW (ref 1.7–2.4)

## 2017-07-21 LAB — PROTIME-INR
INR: 1.14
Prothrombin Time: 14.5 seconds (ref 11.4–15.2)

## 2017-07-21 LAB — BASIC METABOLIC PANEL
Anion gap: 8 (ref 5–15)
BUN: 28 mg/dL — ABNORMAL HIGH (ref 6–20)
CO2: 22 mmol/L (ref 22–32)
Calcium: 7.7 mg/dL — ABNORMAL LOW (ref 8.9–10.3)
Chloride: 112 mmol/L — ABNORMAL HIGH (ref 101–111)
Creatinine, Ser: 1.22 mg/dL (ref 0.61–1.24)
GFR calc Af Amer: >60 mL/min (ref >60)
GFR calc non Af Amer: 58 mL/min — ABNORMAL LOW (ref >60)
Glucose, Bld: 107 mg/dL — ABNORMAL HIGH (ref 65–99)
Potassium: 3.2 mmol/L — ABNORMAL LOW (ref 3.5–5.1)
Sodium: 142 mmol/L (ref 135–145)

## 2017-07-21 MED ORDER — WARFARIN SODIUM 4 MG PO TABS
4.0000 mg | ORAL_TABLET | Freq: Once | ORAL | Status: AC
  Administered 2017-07-21: 4 mg via ORAL
  Filled 2017-07-21: qty 1

## 2017-07-21 MED ORDER — POTASSIUM CHLORIDE CRYS ER 20 MEQ PO TBCR
40.0000 meq | EXTENDED_RELEASE_TABLET | Freq: Once | ORAL | Status: AC
  Administered 2017-07-21: 40 meq via ORAL
  Filled 2017-07-21: qty 2

## 2017-07-21 MED ORDER — MAGNESIUM SULFATE 2 GM/50ML IV SOLN
2.0000 g | Freq: Once | INTRAVENOUS | Status: AC
  Administered 2017-07-21: 2 g via INTRAVENOUS
  Filled 2017-07-21: qty 50

## 2017-07-21 NOTE — Progress Notes (Signed)
ANTICOAGULATION CONSULT NOTE - Follow up Consult  Pharmacy Consult for warfarin restart with Lovenox bridge Indication: hx of DVT and chronic DVT  Allergies  Allergen Reactions  . Latex Other (See Comments)    Per MAR  . Macrobid [employer][Nitrofurantoin Monohyd Macro] Other (See Comments)    Per MAR  . Morphine And Related Other (See Comments)    [facility]    [facility] Measurements: Height: 5\' 11"  (180.3 cm) Weight: 151 lb 14.4 oz (68.9 kg) IBW/kg (Calculated) : 75.3  Vital Signs: Temp: 98.2 F (36.8 C) (10/19 0539) Temp Source: Oral (10/19 0539) BP: 115/63 (10/19 0539) Pulse Rate: 79 (10/19 0539)  Labs:  Recent Labs  07/19/17 0228  07/20/17 1307 07/20/17 1356 07/20/17 2050 07/21/17 0440  HGB 10.1*  --   --   --   --  8.7*  HCT 30.9*  --   --   --   --  27.0*  PLT 175  --   --   --   --  232  LABPROT  --   --   --  14.5  --  14.5  INR  --   --   --  1.13  --  1.14  CREATININE 1.31*  < > 1.19  --  1.25* 1.20  1.22  < > = values in this interval not displayed.  Estimated Creatinine Clearance: 55.8 mL/min (by C-G formula based on SCr of 1.2 mg/dL).   Medical History: Past Medical History:  Diagnosis Date  . Cataract   . CKD (chronic kidney disease)   . Depression   . Diabetes mellitus without complication (HCC)   . Diabetic retinopathy (HCC)   . Hypertension   . Stroke [facility](HCC)     Assessment: 71yo male on warfarin PTA for hx DVT (Pt has history of DVT on Xarelto), warfarin was held at nursing home x 1 week PTA. Patient was not eating or drinking and had blood in his stool. Per nursing home records, patient received vitamin K IM doses 2.5mg  on 10/8 and 5 mg on 10/10 d/t INR of 7.2. INR on admission 10/13 was 1.24, and he was Hemoccult positive. Pt on a dysphagia nectar thick liquid diet and PO intake 20-50%. No evidence of continued bleeding per medical record. Patient will likely require a much lower warfarin dose than PTA d/t drastic change in diet and PO intake.  INR  is subtherapeutic today as expected at 1.14. Hgb is down at 8.7, platelets wnl. No bleeding noted.  PTA dose: warfarin 5 mg on Sat/Sun and 4 mg all other days (per nursing home)  Goal of Therapy:  INR 2-3 Monitor platelets by anticoagulation protocol: Yes   Plan:  Give warfarin 4 mg po x 1 Continue Lovenox 1 mg/kg q12 hours x 5 days and 2 therapeutic INRs Monitor daily INR, CBC, clinical course, s/sx of bleed, PO intake, DDI   Thank you for allowing us to participate in this patients care.  Signe Coltonya C Tamikia Chowning, PharmD Clinical phone for 07/21/2017 from 7a-3:30p: [phone] If after 3:30p, please call main pharmacy at: [phone] 07/21/2017 8:19 AM

## 2017-07-21 NOTE — Progress Notes (Signed)
PROGRESS NOTE    Gilbert Lewis  ZOX:[id number] DOB: 08-24-1946 DOA: 07/15/2017 PCP: Patient, No Pcp Per   Brief Narrative: Gilbert Lewis is a 71 y.o. with a PMH of CVA, hemiplegia, aphasia, type II DM, HTN, DVT; admitted on 07/15/2017, presented with complaint of confusion and poor by mouth intake, was found to have hyponatremia, acute kidney injury, suspected sepsis. Currently further plan is continue his IV antibiotics and IV fluids.   Assessment & Plan:   Active Problems:   Aphasia, late effect of cerebrovascular disease   Anemia, iron deficiency   CKD (chronic kidney disease) stage 3, GFR 30-59 ml/min (HCC)   Pressure ulcer   Diabetes mellitus with complication (HCC)   History of pressure ulcer   Moderate protein-calorie malnutrition (HCC)   Essential hypertension   Acute lower UTI   Melena   Sepsis (HCC)   Hypernatremia   Hypokalemia   Dehydration   AKI (acute kidney injury) (HCC)   GI bleed   Acute urinary retention   Sepsis secondary to UTI VRE UTI -continue amoxicillin (10 day course)  GI bleed Hemoccult positive. Hemoglobin stable. GI consulted without recommendations currently. No evidence of continued bleeding. -continue coumadin with Lovenox bridge  Hypernatremia Likely secondary to poor PO intake. Resolved. -oral nutrition intake  Acute kidney injury on CKD stage III Resolved.  Hypokalemia Resolved with repletion.  Metabolic acidosis Likely secondary to acute kidney injury. Improving.  Type 2 diabetes mellitus Controlled. -continue SSI  Essential hypertension Normotensive  Acute urinary retention Secondary to UTI. S/p urethral catheter -voiding trial today  Right lower leg edema Repeat doppler significant for chronic DVT  History of seizure -continue Keppra  Dysphagia Secondary to history of stroke.    DVT prophylaxis: Coumadin with Lovenox bridge Code Status: Full code Family Communication: None at bedside Disposition Plan:  Discharge when medically stable   Consultants:   Gastroenterology  Procedures:   None  Antimicrobials:  Vancomycin  Zosyn  Meropenem  Ampicillin  Amoxicillin   Subjective: Afebrile.  Objective: Vitals:   07/20/17 0659 07/20/17 1533 07/20/17 2026 07/21/17 0539  BP: 128/67 110/69 125/66 115/63  Pulse:  (!) 103 (!) 102 79  Resp:  18 16 16   Temp: 98 F (36.7 C) 98.5 F (36.9 C) 98.8 F (37.1 C) 98.2 F (36.8 C)  TempSrc: Oral  Oral Oral  SpO2: 97% 99% 100% 100%  Weight:      Height:        Intake/Output Summary (Last 24 hours) at 07/21/17 1345 Last data filed at 07/21/17 1011  Gross per 24 hour  Intake                0 ml  Output              950 ml  Net             -950 ml   Filed Weights   07/15/17 1838 07/16/17 2016  Weight: 72.6 kg (160 lb) 68.9 kg (151 lb 14.4 oz)    Examination:  General exam: Appears calm and comfortable Respiratory system: Clear to auscultation. Respiratory effort normal. Cardiovascular system: Regular rate and rhythm. Normal S1 and S2. No heart murmurs present. No extra heart sounds Gastrointestinal system: Abdomen is nondistended, soft and nontender. Normal bowel sounds heard. Central nervous system: Alert Extremities: No calf tenderness    Data Reviewed: I have personally reviewed following labs and imaging studies  CBC:  Recent Labs Lab 07/15/17 1847  07/16/17 1437 07/16/17 2052 07/18/17  2956 07/19/17 0228 07/21/17 0440  WBC 14.2*  < > 11.3* 12.7* 13.6* 12.2* 9.6  NEUTROABS 10.5*  --   --   --   --  8.8*  --   HGB 12.6*  < > 10.6* 10.7* 10.8* 10.1* 8.7*  HCT 40.9  < > 35.4* 35.2* 33.5* 30.9* 27.0*  MCV 87.6  < > 88.7 88.4 85.9 85.8 83.9  PLT 289  < > 196 205 170 175 232  < > = values in this interval not displayed. Basic Metabolic Panel:  Recent Labs Lab 07/16/17 2052 07/17/17 0336  07/19/17 0228  07/20/17 0502 07/20/17 1307 07/20/17 2050 07/21/17 0440 07/21/17 0823 07/21/17 1208  NA 154* 157*   < > 148*  < > 144 140 142 142  142  --  142  K 3.2* 3.7  < > 3.2*  < > 3.5 3.5 3.5 3.2*  3.2*  --  3.5  CL 129* >130*  < > 122*  < > 116* 114* 113* 112*  112*  --  113*  CO2 19* 19*  < > 21*  < > 22 19* 22 23  22   --  23  GLUCOSE 124* 111*  < > 99  < > 100* 134* 169* 107*  107*  --  166*  BUN 63* 56*  < > 34*  < > 29* 27* 27* 28*  28*  --  28*  CREATININE 1.70* 1.64*  < > 1.31*  < > 1.19 1.19 1.25* 1.20  1.22  --  1.20  CALCIUM 7.7* 7.9*  < > 7.7*  < > 7.8* 7.6* 7.7* 7.8*  7.7*  --  7.8*  MG 1.6* 2.0  --  1.4*  --   --   --   --   --  1.5*  --   PHOS 2.1* 2.1*  < > 2.5  < > 2.4* 2.8 2.7 3.4  --  3.8  < > = values in this interval not displayed. GFR: Estimated Creatinine Clearance: 55.8 mL/min (by C-G formula based on SCr of 1.2 mg/dL). Liver Function Tests:  Recent Labs Lab 07/15/17 1847  07/17/17 0336  07/19/17 0228  07/20/17 0502 07/20/17 1307 07/20/17 2050 07/21/17 0440 07/21/17 1208  AST 19  --  18  --  14*  --   --   --   --   --   --   ALT 10*  --  9*  --  9*  --   --   --   --   --   --   ALKPHOS 67  --  65  --  63  --   --   --   --   --   --   BILITOT 1.0  --  0.9  --  0.6  --   --   --   --   --   --   PROT 7.1  --  6.0*  --  5.8*  --   --   --   --   --   --   ALBUMIN 2.7*  < > 2.4*  < > 2.3*  < > 2.2* 2.5* 2.3* 2.3* 2.3*  < > = values in this interval not displayed.  Recent Labs Lab 07/16/17 0343  LIPASE 55*   No results for input(s): AMMONIA in the last 168 hours. Coagulation Profile:  Recent Labs Lab 07/15/17 1847 07/17/17 0336 07/20/17 1356 07/21/17 0440  INR 1.24 1.24 1.13 1.14   Cardiac  Enzymes:  Recent Labs Lab 07/15/17 2124 07/16/17 2052 07/17/17 0336  CKTOTAL 78  --   --   TROPONINI  --  <0.03 <0.03   BNP (last 3 results) No results for input(s): PROBNP in the last 8760 hours. HbA1C: No results for input(s): HGBA1C in the last 72 hours. CBG:  Recent Labs Lab 07/20/17 1201 07/20/17 1728 07/20/17 2216 07/21/17 0815  07/21/17 1215  GLUCAP 126* 153* 159* 105* 157*   Lipid Profile: No results for input(s): CHOL, HDL, LDLCALC, TRIG, CHOLHDL, LDLDIRECT in the last 72 hours. Thyroid Function Tests: No results for input(s): TSH, T4TOTAL, FREET4, T3FREE, THYROIDAB in the last 72 hours. Anemia Panel: No results for input(s): VITAMINB12, FOLATE, FERRITIN, TIBC, IRON, RETICCTPCT in the last 72 hours. Sepsis Labs:  Recent Labs Lab 07/15/17 2128 07/17/17 0336  LATICACIDVEN 0.66 0.9    Recent Results (from the past 240 hour(s))  Culture, blood (Routine x 2)     Status: None   Collection Time: 07/15/17  6:47 PM  Result Value Ref Range Status   Specimen Description BLOOD LEFT ARM  Final   Special Requests   Final    BOTTLES DRAWN AEROBIC AND ANAEROBIC Blood Culture adequate volume   Culture NO GROWTH 5 DAYS  Final   Report Status 07/20/2017 FINAL  Final  Culture, blood (Routine x 2)     Status: None   Collection Time: 07/15/17  7:00 PM  Result Value Ref Range Status   Specimen Description BLOOD RIGHT FINGER  Final   Special Requests IN PEDIATRIC BOTTLE Blood Culture adequate volume  Final   Culture NO GROWTH 5 DAYS  Final   Report Status 07/20/2017 FINAL  Final  Urine Culture     Status: Abnormal   Collection Time: 07/15/17  8:08 PM  Result Value Ref Range Status   Specimen Description URINE, RANDOM  Final   Special Requests NONE  Final   Culture (A)  Final    >=100,000 COLONIES/mL VANCOMYCIN RESISTANT ENTEROCOCCUS   Report Status 07/18/2017 FINAL  Final   Organism ID, Bacteria VANCOMYCIN RESISTANT ENTEROCOCCUS (A)  Final      Susceptibility   Vancomycin resistant enterococcus - MIC*    AMPICILLIN <=2 SENSITIVE Sensitive     LEVOFLOXACIN >=8 RESISTANT Resistant     NITROFURANTOIN <=16 SENSITIVE Sensitive     VANCOMYCIN >=32 RESISTANT Resistant     LINEZOLID 2 SENSITIVE Sensitive     * >=100,000 COLONIES/mL VANCOMYCIN RESISTANT ENTEROCOCCUS  MRSA PCR Screening     Status: None   Collection  Time: 07/16/17  8:46 PM  Result Value Ref Range Status   MRSA by PCR NEGATIVE NEGATIVE Final    Comment:        The GeneXpert MRSA Assay (FDA approved for NASAL specimens only), is one component of a comprehensive MRSA colonization surveillance program. It is not intended to diagnose MRSA infection nor to guide or monitor treatment for MRSA infections.          Radiology Studies: No results found.      Scheduled Meds: . amoxicillin  500 mg Oral Q8H  . chlorhexidine  15 mL Mouth Rinse BID  . enoxaparin (LOVENOX) injection  1 mg/kg Subcutaneous Q12H  . feeding supplement (GLUCERNA SHAKE)  237 mL Oral TID BM  . folic acid  1 mg Oral Daily  . insulin aspart  0-5 Units Subcutaneous QHS  . insulin aspart  0-9 Units Subcutaneous TID WC  . levETIRAcetam  250 mg Oral BID  .  mouth rinse  15 mL Mouth Rinse q12n4p  . multivitamin with minerals  1 tablet Oral Daily  . nystatin  5 mL Oral QID  . pantoprazole sodium  40 mg Oral BID  . tamsulosin  0.4 mg Oral QPC supper  . thiamine  100 mg Oral Daily  . warfarin  4 mg Oral ONCE-1800  . Warfarin - Pharmacist Dosing Inpatient   Does not apply q1800   Continuous Infusions: . magnesium sulfate 1 - 4 g bolus IVPB       LOS: 5 days     Jacquelin Hawking, MD [facility] 07/21/2017, 1:45 PM Pager: [phone]  If 7PM-7AM, please contact night-coverage [email or web address] Password [email or web address] 07/21/2017, 1:45 PM

## 2017-07-21 NOTE — Discharge Instructions (Signed)

## 2017-07-21 NOTE — Discharge Summary (Addendum)
Physician Discharge Summary  Gilbert Lewis ZOX:[id number] DOB: 21-Apr-1946 DOA: 07/15/2017  PCP: Patient, No Pcp Per  Admit date: 07/15/2017 Discharge date: 07/23/2017  Admitted From: SNF Disposition: SNF  Recommendations for Outpatient Follow-up:  1. Follow up with PCP in 1 week 2. Please obtain BMP/CBC in one week 3. Amoxicillin end date of 07/24/2017 4. Outpatient hospice 5. Please ensure good hygiene for patient to help prevent recurrent UTIs, including toileting hygiene, frequent changes of adult diapers if needed, good oral hygiene to prevent pneumonias, etc. 6. Please follow up on the following pending results: None 7. Warfarin with Lovenox bridge (last INR of 1.13 on 07/23/2017):  Continue Lovenox 1 mg/kg q12 hours x 1 day AND 2 therapeutic INRs  Monitor daily INR, CBC, clinical course, s/sx of bleed, PO intake, DDI  Home Health: SNF Equipment/Devices: SNF  Discharge Condition: Stable CODE STATUS: Full code Diet recommendation:   Diet recommendations: Dysphagia 1 (puree);Nectar-thick liquid Liquids provided via: Cup;Straw Medication Administration: Crushed with puree Supervision: Patient able to self feed;Full supervision/cueing for compensatory strategies;Staff to assist with self feeding Compensations: Minimize environmental distractions;Slow rate;Small sips/bites;Multiple dry swallows after each bite/sip Postural Changes and/or Swallow Maneuvers: Seated upright 90 degrees    Brief/Interim Summary:  Admission HPI written by Therisa Doyne, MD   HPI: Gilbert Lewis is a 71 y.o. male with medical history significant of hereditary spastic hemiplegia, CVA with resultant aphasia,  DM2 , and HTN, history of ESBL infection. , HTN, hx of DVT    Presented with not eating and drinking anything for past 1 week per family have had possible blood in stool and PCP at nursing home have stop his Coumadin one week ago his INR on the 10/08 was 7.2. On October 10 patient was  given vitamin K. Wife is unsure but seems that today his right lower extremity started to swell. He was brought in to emergency department was found to be hypernatremic up to 158 Discussed at length with family issue of dehydration secondary to decreased by mouth intake patient at his baseline is a good eater but for past 1 week stopped. Family states right after his stroke he did require PEG tube placement if needed family would be interested in possibly restarting tube feedings. Discussed possibility of filter placement in case patient does have DVT and ongoing GI bleed. Family at this point would like to avoid over aggressive interventions but agreeable to endoscopy. Had a St. discussion regarding goals of care at this point family would be interested in full code IN  emerged department confirmed to have stools which is Hemoccult positive. CT of abdomen showed urinary retention with bilateral hydronephrosis and a large bladder likely contributing to abdominal discomfort as well as obstipation after that patient did have a large bowel movement significant for maroon stool.   Hospital course:  Sepsis secondary to UTI VRE UTI Patient empirically covered with vancomycin and Zosyn which was transitioned to meropenem.  Once cultures are available, he was transitioned to ampicillin and finally to amoxicillin.  Plan for 10 days total course.  End date of 07/25/2017.  GI bleed Hemoccult positive. Hemoglobin stable. GI consulted without recommendations currently. No evidence of continued bleeding.  Has been started on home Coumadin with a Lovenox bridge.  No evidence of recurrent bleeding.  Hypernatremia Likely secondary to poor PO intake. Resolved with IV hydration and increased oral intake.  Acute kidney injury on CKD stage III Secondary to hypovolemia.  Resolved.  Hypokalemia Resolved with repletion.   Metabolic acidosis  Likely secondary to acute kidney injury.  Resolved.  Type 2  diabetes mellitus Controlled.  Managed with insulin while inpatient.  Continue home regimen on discharge.  Essential hypertension Normotensive. Home antihypertensives held but restarted on discharge.  Please watch blood pressure while on hypertensives.    Acute urinary retention Secondary to UTI. S/p urethral catheter.  Failed voiding trial.  Recommend outpatient follow-up with urology  Right lower leg edema Repeat doppler significant for chronic DVT.  History of seizure Continued Keppra  Dysphagia Secondary to history of stroke. Stable. Speech therapy recommendations above.   Discharge Diagnoses:  Active Problems:   Aphasia, late effect of cerebrovascular disease   Anemia, iron deficiency   CKD (chronic kidney disease) stage 3, GFR 30-59 ml/min (HCC)   Pressure ulcer   Diabetes mellitus with complication (HCC)   History of pressure ulcer   Moderate protein-calorie malnutrition (HCC)   Essential hypertension   Acute lower UTI   Melena   Sepsis (HCC)   Hypernatremia   Hypokalemia   Dehydration   AKI (acute kidney injury) (HCC)   GI bleed   Acute urinary retention    Discharge Instructions   Allergies as of 07/23/2017      Reactions   Latex Other (See Comments)   Per MAR   Macrobid [nitrofurantoin Monohyd Macro] Other (See Comments)   Per MAR   Morphine And Related Other (See Comments)   Per MAR      Medication List    STOP taking these medications   baclofen 10 MG tablet Commonly known as:  LIORESAL   INVANZ 1 g injection Generic drug:  ertapenem   LORazepam 0.5 MG tablet Commonly known as:  ATIVAN   Oxycodone HCl 10 MG Tabs   phytonadione 10 MG/ML injection Commonly known as:  VITAMIN K   phytonadione 2 MG/ML Soln injection Commonly known as:  VITAMIN K     TAKE these medications   acetaminophen 325 MG tablet Commonly known as:  TYLENOL Take 650 mg by mouth every 4 (four) hours as needed for mild pain.   amoxicillin 500 MG  capsule Commonly known as:  AMOXIL Take 1 capsule (500 mg total) by mouth every 8 (eight) hours. End date 07/24/17   atorvastatin 10 MG tablet Commonly known as:  LIPITOR Take 1 tablet (10 mg total) by mouth daily at 6 PM. What changed:  when to take this   CARAFATE 1 g tablet Generic drug:  sucralfate Take 1 g by mouth 4 (four) times daily.   DECUBI-VITE Caps Take 1 capsule by mouth daily.   docusate sodium 100 MG capsule Commonly known as:  COLACE Take 100 mg by mouth 2 (two) times daily.   enoxaparin 80 MG/0.8ML injection Commonly known as:  LOVENOX Inject 0.7 mLs (70 mg total) into the skin every 12 (twelve) hours.   feeding supplement (GLUCERNA SHAKE) Liqd Take 237 mLs by mouth 3 (three) times daily between meals.   feeding supplement (PRO-STAT SUGAR FREE 64) Liqd Take 30 mLs by mouth 2 (two) times daily.   ferrous sulfate 325 (65 FE) MG tablet Take 325 mg by mouth 2 (two) times daily with a meal.   finasteride 5 MG tablet Commonly known as:  PROSCAR Take 5 mg by mouth every evening.   hydrocortisone 2.5 % rectal cream Commonly known as:  ANUSOL-HC Apply 1 application topically 4 (four) times daily as needed for hemorrhoids.   Lavender Oil Oil 1 drop by Does not apply route as needed. Apply  one drop behind each ear and on pillowcase as needed for agitation, anxiety, restlessness, insomnia. Discontinue use if skin irritation occurs   levETIRAcetam 250 MG tablet Commonly known as:  KEPPRA Take 250 mg by mouth 2 (two) times daily.   losartan 50 MG tablet Commonly known as:  COZAAR Take 0.5 tablets (25 mg total) by mouth daily.   Melatonin 10 MG Tabs Take 10 mg by mouth at bedtime.   metFORMIN 1000 MG tablet Commonly known as:  GLUCOPHAGE Take 500 mg by mouth 2 (two) times daily with a meal.   metoprolol tartrate 25 MG tablet Commonly known as:  LOPRESSOR Take 0.5 tablets (12.5 mg total) by mouth 2 (two) times daily.   nystatin powder Commonly known  as:  MYCOSTATIN/NYSTOP Apply topically 4 (four) times daily. to scrotum/perineum.   omeprazole 20 MG capsule Commonly known as:  PRILOSEC Take 20 mg by mouth daily.   ondansetron 4 MG tablet Commonly known as:  ZOFRAN Take 4 mg by mouth every 8 (eight) hours as needed for nausea or vomiting.   polyethylene glycol packet Commonly known as:  MIRALAX / GLYCOLAX Take 17 g by mouth 2 (two) times daily.   PROBIOTIC ACIDOPHILUS Tabs Take 1 tablet by mouth 2 (two) times daily.   PROCEL Powd Take 1 scoop by mouth 2 (two) times daily.   QUEtiapine 50 MG tablet Commonly known as:  SEROQUEL Take 50 mg by mouth at bedtime.   senna 8.6 MG tablet Commonly known as:  SENOKOT Take 2 tablets by mouth 2 (two) times daily.   sertraline 100 MG tablet Commonly known as:  ZOLOFT Take 150 mg by mouth daily.   solifenacin 5 MG tablet Commonly known as:  VESICARE Take 5 mg by mouth daily.   SYSTANE BALANCE 0.6 % Soln Generic drug:  Propylene Glycol Apply 1 drop to eye 2 (two) times daily.   tamsulosin 0.4 MG Caps capsule Commonly known as:  FLOMAX Take 0.4 mg by mouth daily after supper.   ursodiol 500 MG tablet Commonly known as:  ACTIGALL Take 500 mg by mouth 2 (two) times daily.   warfarin 5 MG tablet Commonly known as:  COUMADIN Take 5 mg by mouth See admin instructions. Take 1 tablet (5mg ) by mouth on Sat, Sun   warfarin 4 MG tablet Commonly known as:  COUMADIN Take 4 mg by mouth See admin instructions. Take 1 tablet (4mg ) by mouth everyday on Mon, Tue, Wed, Thur, Fri.       Allergies  Allergen Reactions  . Latex Other (See Comments)    Per MAR  . Macrobid [employer] Macro] Other (See Comments)    Per MAR  . Morphine And Related Other (See Comments)    Per [facility]    Consultations:  Gastroenterology   Procedures/Studies: Ct Abdomen Pelvis Wo Contrast  Result Date: 07/16/2017 CLINICAL DATA:  Lethargic.  Not eating or drinking. EXAM: CT ABDOMEN AND  PELVIS WITHOUT CONTRAST TECHNIQUE: Multidetector CT imaging of the abdomen and pelvis was performed following the standard protocol without IV contrast. COMPARISON:  06/19/2017 FINDINGS: Lower chest: Mild linear atelectatic appearing opacities in the lung bases. No confluent consolidation. No effusions. Hepatobiliary: Subtle low-attenuation liver lesions, seen to better advantage on the contrast enhanced study of 06/19/2017. Multiple calculi within the gallbladder, measuring up to 1.5 cm. No bile duct dilatation. Pancreas: Unremarkable unenhanced appearances of the pancreas. Spleen: Unremarkable unenhanced appearances of the spleen. Adrenals/Urinary Tract: Both adrenals are normal. There is marked distention of the  urinary bladder, up to several cm above the level of the umbilicus. There is prominent ureteral dilatation and hydronephrosis bilaterally. This is new from 06/19/2017. Collecting system calculi are again evident bilaterally. No evidence of ureteral calculi. Low-attenuation right renal lesions are again evident, unchanged and likely cysts. Stomach/Bowel: Marked rectal distention with stool, possible fecal impaction. No focal bowel lesion. No focal bowel inflammation. No extraluminal gas. Vascular/Lymphatic: The abdominal aorta is normal in caliber with moderate atherosclerotic calcification. No adenopathy in the abdomen or pelvis. Reproductive: Unremarkable Musculoskeletal: No significant skeletal lesion. Bridging anterior osteophytes of the lumbar spine IMPRESSION: 1. Marked urinary bladder distention, well above the level of the umbilicus. Associated ureteral dilatation and hydronephrosis bilaterally. 2. Marked rectal distention with stool, possible fecal impaction. 3. Bilateral nephrolithiasis, nonobstructing. 4. Low-attenuation liver lesions, indeterminate. Unchanged from 06/19/17 5. Cholelithiasis. 6. Aortic atherosclerosis Electronically Signed   By: Ellery Plunkaniel R Mitchell M.D.   On: 07/16/2017 02:58    Dg Chest 1 View  Result Date: 07/15/2017 CLINICAL DATA:  Nonverbal patient.  History of stroke. EXAM: CHEST 1 VIEW COMPARISON:  07/07/2016 FINDINGS: Cardiomediastinal silhouette is normal. Mediastinal contours appear intact. Calcific atherosclerotic disease and tortuosity of the aorta. There is no evidence of focal airspace consolidation, pleural effusion or pneumothorax. Osseous structures are without acute abnormality. Soft tissues are grossly normal. IMPRESSION: No active disease. Calcific atherosclerotic disease and tortuosity of the aorta. Electronically Signed   By: Ted Mcalpineobrinka  Dimitrova M.D.   On: 07/15/2017 19:26   Ct Head Wo Contrast  Result Date: 07/16/2017 CLINICAL DATA:  Altered mental status and epistaxis. History of cerebral infarction. EXAM: CT HEAD WITHOUT CONTRAST CT MAXILLOFACIAL WITHOUT CONTRAST TECHNIQUE: Multidetector CT imaging of the head and maxillofacial structures were performed using the standard protocol without intravenous contrast. Multiplanar CT image reconstructions of the maxillofacial structures were also generated. COMPARISON:  CT of the head on 07/07/2016 FINDINGS: CT HEAD FINDINGS Brain: Stable all left-sided MCA territory infarct. The brain demonstrates no evidence of hemorrhage, infarction, edema, mass effect, extra-axial fluid collection, hydrocephalus or mass lesion. The skull is unremarkable. Vascular: No hyperdense vessel or unexpected calcification. Skull: Normal. Negative for fracture or focal lesion. Other: None. CT MAXILLOFACIAL FINDINGS Osseous: No maxillofacial fracture or mandibular dislocation. No destructive process. 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 Orbits: Negative. No traumatic or inflammatory finding. Sinuses: Clear. Soft tissues: No visualized soft tissue masses or enlarged lymph nodes. The visualized airway is normally patent.  IMPRESSION: 1. 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 No acute findings by head CT. 2. No acute findings by a maxillofacial CT. 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 Electronically Signed   By: Irish LackGlenn  Yamagata M.D.   On: 07/16/2017 10:35   Dg Abd Portable 1v  Result Date: 07/18/2017 CLINICAL DATA:  Gastrointestinal bleeding.  Abdominal pain. EXAM: PORTABLE ABDOMEN - 1 VIEW COMPARISON:  CT abdomen and pelvis 07/16/2017. FINDINGS: The bowel gas pattern is normal. No radio-opaque calculi or other significant radiographic abnormality are seen. IMPRESSION: Negative exam. Electronically Signed   By: Drusilla Kannerhomas  Dalessio M.D.   On: 07/18/2017 09:13   Dg Swallowing Func-speech Pathology  Result Date: 07/18/2017 Objective Swallowing Evaluation: Type of Study: MBS-Modified Barium Swallow Study Patient Details Name: Gilbert Lewis MRN: [id number] Date of Birth: 1946/08/21 Today's Date: 07/18/2017 Time: SLP Start Time (ACUTE ONLY): 1413-SLP Stop Time (ACUTE ONLY): 1430 SLP  Time Calculation (min) (ACUTE ONLY): 17 min Past Medical History: Past Medical History: Diagnosis Date . Cataract  . CKD (chronic kidney disease)  . Depression  . Diabetes mellitus without complication (HCC)  . Diabetic retinopathy (HCC)  . Hypertension  . Stroke [facility])  Past Surgical History: Past Surgical History: Procedure Laterality Date . APPENDECTOMY   . SKIN BIOPSY   HPI: Braylin Xu a 71 y.o.malewith medical history significant of hereditary spastic hemiplegia, CVA with resultant aphasia,DM2 , and HTN, history of ESBL infection. , HTN, DVT, DISH (Diffuse idiopathic skeletal hyperostosis), PEG after CVA admitted due to not eating or drinking x 1 week per family. Found to be hypernatremic and CT of abdomen showed urinary retention with bilateral hydronephrosis. 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 No acute findings by head  CT, ankylosis anteriorly of cervical vertebral bodies by a large bridging osteophyte. MBS 07/07/16 mild delay, UES residue, regular/thin recommended.  No Data Recorded Assessment / Plan / Recommendation CHL IP CLINICAL IMPRESSIONS 07/18/2017 Clinical Impression Prolonged mastication and transit with solid texture impacted by xerostomia and dried soft palate secretions that SLP has been performing oral care/decontamination/infection control. Timing and coordination of swallow within functional limits. He exhibited decreased epiglottic inversion resulting in consistent vallecular residue (increased with solid texture). Verbal cues successful with second swallow significantly decreasing pharyngeal residue. Laryngeal penetration of straw sip thin remaining in vestibule and unable to produce volitional cough. Cup sips thin liquids did not intrude laryngeal vestibule however aspiration risk is high with dependent feeder, cognitive deficits and premorbid aphasia (receptive in addition to expressive?). Nectar consistency did not significantly increase pharyngeal residue. Recommend Dys 1 (puree), nectar thick liquids (should be able to upgrade at bedside with cups), no straws, crush pills, FULL supervision and assist, swallow twice. Continue intensive oral care. ST will continue to follow.  SLP Visit Diagnosis Dysphagia, oropharyngeal phase (R13.12) Attention and concentration deficit following -- Frontal lobe and executive function deficit following -- Impact on safety and function Moderate aspiration risk   CHL IP TREATMENT RECOMMENDATION 07/18/2017 Treatment Recommendations Therapy as outlined in treatment plan below   Prognosis 07/18/2017 Prognosis for Safe Diet Advancement (No Data) Barriers to Reach Goals Cognitive deficits Barriers/Prognosis Comment -- CHL IP DIET RECOMMENDATION 07/18/2017 SLP Diet Recommendations Dysphagia 1 (Puree) solids;Nectar thick liquid Liquid Administration via Cup;No straw Medication  Administration Crushed with puree Compensations Slow rate;Small sips/bites;Multiple dry swallows after each bite/sip;Minimize environmental distractions Postural Changes Seated upright at 90 degrees   CHL IP OTHER RECOMMENDATIONS 07/18/2017 Recommended Consults -- Oral Care Recommendations Oral care BID Other Recommendations --   CHL IP FOLLOW UP RECOMMENDATIONS 07/18/2017 Follow up Recommendations Skilled Nursing facility   [facility] IP FREQUENCY AND DURATION 07/18/2017 Speech Therapy Frequency (ACUTE ONLY) min 2x/week Treatment Duration 2 weeks      CHL IP ORAL PHASE 07/18/2017 Oral Phase Impaired Oral - Pudding Teaspoon -- Oral - Pudding Cup -- Oral - Honey Teaspoon -- Oral - Honey Cup -- Oral - Nectar Teaspoon -- Oral - Nectar Cup -- Oral - Nectar Straw -- Oral - Thin Teaspoon -- Oral - Thin Cup -- Oral - Thin Straw -- Oral - Puree -- Oral - Mech Soft -- Oral - Regular Delayed oral transit;Weak lingual manipulation Oral - Multi-Consistency -- Oral - Pill -- Oral Phase - Comment --  CHL IP PHARYNGEAL PHASE 07/18/2017 Pharyngeal Phase Impaired Pharyngeal- Pudding Teaspoon -- Pharyngeal -- Pharyngeal- Pudding Cup -- Pharyngeal -- Pharyngeal- Honey Teaspoon -- Pharyngeal -- Pharyngeal-  Honey Cup -- Pharyngeal -- Pharyngeal- Nectar Teaspoon -- Pharyngeal -- Pharyngeal- Nectar Cup Pharyngeal residue - valleculae;Reduced epiglottic inversion Pharyngeal -- Pharyngeal- Nectar Straw -- Pharyngeal -- Pharyngeal- Thin Teaspoon -- Pharyngeal -- Pharyngeal- Thin Cup Pharyngeal residue - valleculae;Pharyngeal residue - pyriform;Reduced epiglottic inversion;Reduced anterior laryngeal mobility Pharyngeal Material does not enter airway Pharyngeal- Thin Straw Penetration/Aspiration during swallow;Pharyngeal residue - valleculae;Pharyngeal residue - pyriform;Reduced epiglottic inversion;Reduced anterior laryngeal mobility Pharyngeal Material enters airway, remains ABOVE vocal cords and not ejected out Pharyngeal- Puree -- Pharyngeal --  Pharyngeal- Mechanical Soft -- Pharyngeal -- Pharyngeal- Regular Pharyngeal residue - valleculae;Reduced epiglottic inversion;Pharyngeal residue - pyriform;Reduced anterior laryngeal mobility Pharyngeal -- Pharyngeal- Multi-consistency -- Pharyngeal -- Pharyngeal- Pill -- Pharyngeal -- Pharyngeal Comment --  CHL IP CERVICAL ESOPHAGEAL PHASE 07/07/2016 Cervical Esophageal Phase Impaired Pudding Teaspoon -- Pudding Cup -- Honey Teaspoon -- Honey Cup -- Nectar Teaspoon -- Nectar Cup -- Nectar Straw -- Thin Teaspoon -- Thin Cup Reduced cricopharyngeal relaxation Thin Straw Reduced cricopharyngeal relaxation Puree Reduced cricopharyngeal relaxation Mechanical Soft -- Regular Reduced cricopharyngeal relaxation Multi-consistency -- Pill Reduced cricopharyngeal relaxation Cervical Esophageal Comment -- CHL IP GO 07/07/2016 Functional Assessment Tool Used skilled clinical judgment Functional Limitations Swallowing Swallow Current Status (Z6109) CI Swallow Goal Status (U0454) CI Swallow Discharge Status (U9811) CI Motor Speech Current Status (B1478) (None) Motor Speech Goal Status (G9562) (None) Motor Speech Goal Status (Z3086) (None) Spoken Language Comprehension Current Status (V7846) (None) Spoken Language Comprehension Goal Status (N6295) (None) Spoken Language Comprehension Discharge Status (M8413) (None) Spoken Language Expression Current Status (K4401) (None) Spoken Language Expression Goal Status (U2725) (None) Spoken Language Expression Discharge Status (D6644) (None) Attention Current Status (I3474) (None) Attention Goal Status (Q5956) (None) Attention Discharge Status (L8756) (None) Memory Current Status (E3329) (None) Memory Goal Status (J1884) (None) Memory Discharge Status (Z6606) (None) Voice Current Status (T0160) (None) Voice Goal Status (F0932) (None) Voice Discharge Status (T5573) (None) Other Speech-Language Pathology Functional Limitation Current Status (U2025) (None) Other Speech-Language Pathology  Functional Limitation Goal Status (K2706) (None) Other Speech-Language Pathology Functional Limitation Discharge Status [phone]) (None) Gilbert Lewis 07/18/2017, 3:23 PM Breck Coons [name].Ed CCC-SLP Pager [phone]              Ct Maxillofacial Wo Contrast  Result Date: 07/16/2017 CLINICAL DATA:  Altered mental status and epistaxis. History of cerebral infarction. EXAM: CT HEAD WITHOUT CONTRAST CT MAXILLOFACIAL WITHOUT CONTRAST TECHNIQUE: Multidetector CT imaging of the head and maxillofacial structures were performed using the standard protocol without intravenous contrast. Multiplanar CT image reconstructions of the maxillofacial structures were also generated. COMPARISON:  CT of the head on 07/07/2016 FINDINGS: CT HEAD FINDINGS Brain: Stable all left-sided MCA territory infarct. The brain demonstrates no evidence of hemorrhage, infarction, edema, mass effect, extra-axial fluid collection, hydrocephalus or mass lesion. The skull is unremarkable. Vascular: No hyperdense vessel or unexpected calcification. Skull: Normal. Negative for fracture or focal lesion. Other: None. CT MAXILLOFACIAL FINDINGS Osseous: No maxillofacial fracture or mandibular dislocation. No destructive process. 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 Orbits: Negative. No traumatic or inflammatory finding. Sinuses: Clear. Soft tissues: No visualized soft tissue masses or enlarged lymph nodes. The visualized airway is normally patent. IMPRESSION: 1. 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 No acute findings by head CT. 2. No acute findings by a maxillofacial CT. 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 Electronically Signed  By: Irish Lack M.D.   On: 07/16/2017 10:35     Subjective: No concerns today. No pain.  Discharge Exam: Vitals:    07/22/17 2134 07/23/17 0500  BP: 114/66 120/64  Pulse: 91 84  Resp: 20 20  Temp: 98.6 F (37 C) 98.4 F (36.9 C)  SpO2: 99% 99%   Vitals:   07/22/17 0623 07/22/17 1431 07/22/17 2134 07/23/17 0500  BP: 128/70 117/71 114/66 120/64  Pulse: 82 86 91 84  Resp: 18 20 20 20   Temp: 97.8 F (36.6 C) 98.4 F (36.9 C) 98.6 F (37 C) 98.4 F (36.9 C)  TempSrc: Oral Oral Oral Oral  SpO2: 99% 96% 99% 99%  Weight:      Height:        General exam: Appears calm and comfortable Respiratory system: Clear to auscultation. Respiratory effort normal. Cardiovascular system: Regular rate and rhythm. Normal S1 and S2. No heart murmurs present. No extra heart sounds Gastrointestinal system: Abdomen is nondistended, soft and nontender. Normal bowel sounds heard. Central nervous system: Alert Extremities: No calf tenderness   The results of significant diagnostics from this hospitalization (including imaging, microbiology, ancillary and laboratory) are listed below for reference.     Microbiology: Recent Results (from the past 240 hour(s))  Culture, blood (Routine x 2)     Status: None   Collection Time: 07/15/17  6:47 PM  Result Value Ref Range Status   Specimen Description BLOOD LEFT ARM  Final   Special Requests   Final    BOTTLES DRAWN AEROBIC AND ANAEROBIC Blood Culture adequate volume   Culture NO GROWTH 5 DAYS  Final   Report Status 07/20/2017 FINAL  Final  Culture, blood (Routine x 2)     Status: None   Collection Time: 07/15/17  7:00 PM  Result Value Ref Range Status   Specimen Description BLOOD RIGHT FINGER  Final   Special Requests IN PEDIATRIC BOTTLE Blood Culture adequate volume  Final   Culture NO GROWTH 5 DAYS  Final   Report Status 07/20/2017 FINAL  Final  Urine Culture     Status: Abnormal   Collection Time: 07/15/17  8:08 PM  Result Value Ref Range Status   Specimen Description URINE, RANDOM  Final   Special Requests NONE  Final   Culture (A)  Final    >=100,000  COLONIES/mL VANCOMYCIN RESISTANT ENTEROCOCCUS   Report Status 07/18/2017 FINAL  Final   Organism ID, Bacteria VANCOMYCIN RESISTANT ENTEROCOCCUS (A)  Final      Susceptibility   Vancomycin resistant enterococcus - MIC*    AMPICILLIN <=2 SENSITIVE Sensitive     LEVOFLOXACIN >=8 RESISTANT Resistant     NITROFURANTOIN <=16 SENSITIVE Sensitive     VANCOMYCIN >=32 RESISTANT Resistant     LINEZOLID 2 SENSITIVE Sensitive     * >=100,000 COLONIES/mL VANCOMYCIN RESISTANT ENTEROCOCCUS  MRSA PCR Screening     Status: None   Collection Time: 07/16/17  8:46 PM  Result Value Ref Range Status   MRSA by PCR NEGATIVE NEGATIVE Final    Comment:        The GeneXpert MRSA Assay (FDA approved for NASAL specimens only), is one component of a comprehensive MRSA colonization surveillance program. It is not intended to diagnose MRSA infection nor to guide or monitor treatment for MRSA infections.      Labs: BNP (last 3 results) No results for input(s): BNP in the last 8760 hours. Basic Metabolic Panel:  Recent Labs Lab 07/16/17 2052 07/17/17 0336  07/19/17 0228  07/21/17 5366 07/21/17 1208 07/21/17 2055 07/22/17 0429 07/22/17 1318 07/23/17 0521  NA 154* 157*  < > 148*  < >  --  142 138 143 141 142  K 3.2* 3.7  < > 3.2*  < >  --  3.5 4.1 3.6 3.7 3.6  CL 129* >130*  < > 122*  < >  --  113* 110 111 111 111  CO2 19* 19*  < > 21*  < >  --  23 21* 23 24 24   GLUCOSE 124* 111*  < > 99  < >  --  166* 153* 98 129* 100*  BUN 63* 56*  < > 34*  < >  --  28* 28* 26* 25* 24*  CREATININE 1.70* 1.64*  < > 1.31*  < >  --  1.20 1.23 1.11 1.09 1.12  CALCIUM 7.7* 7.9*  < > 7.7*  < >  --  7.8* 7.7* 8.0* 8.0* 8.2*  MG 1.6* 2.0  --  1.4*  --  1.5*  --   --   --   --   --   PHOS 2.1* 2.1*  < > 2.5  < >  --  3.8 3.2 3.2 3.4 3.2  < > = values in this interval not displayed. Liver Function Tests:  Recent Labs Lab 07/17/17 0336  07/19/17 0228  07/21/17 1208 07/21/17 2055 07/22/17 0429 07/22/17 1318  07/23/17 0521  AST 18  --  14*  --   --   --   --   --   --   ALT 9*  --  9*  --   --   --   --   --   --   ALKPHOS 65  --  63  --   --   --   --   --   --   BILITOT 0.9  --  0.6  --   --   --   --   --   --   PROT 6.0*  --  5.8*  --   --   --   --   --   --   ALBUMIN 2.4*  < > 2.3*  < > 2.3* 2.3* 2.3* 2.4* 2.5*  < > = values in this interval not displayed. No results for input(s): LIPASE, AMYLASE in the last 168 hours. No results for input(s): AMMONIA in the last 168 hours. CBC:  Recent Labs Lab 07/18/17 0538 07/19/17 0228 07/21/17 0440 07/22/17 0429 07/23/17 0521  WBC 13.6* 12.2* 9.6 7.9 7.2  NEUTROABS  --  8.8*  --   --   --   HGB 10.8* 10.1* 8.7* 8.8* 9.1*  HCT 33.5* 30.9* 27.0* 27.6* 28.6*  MCV 85.9 85.8 83.9 84.1 85.1  PLT 170 175 232 268 335   Cardiac Enzymes:  Recent Labs Lab 07/16/17 2052 07/17/17 0336  TROPONINI <0.03 <0.03   BNP: Invalid input(s): POCBNP CBG:  Recent Labs Lab 07/22/17 1218 07/22/17 1718 07/22/17 2132 07/23/17 0729 07/23/17 0754  GLUCAP 117* 181* 127* 114* 120*   D-Dimer No results for input(s): DDIMER in the last 72 hours. Hgb A1c No results for input(s): HGBA1C in the last 72 hours. Lipid Profile No results for input(s): CHOL, HDL, LDLCALC, TRIG, CHOLHDL, LDLDIRECT in the last 72 hours. Thyroid function studies No results for input(s): TSH, T4TOTAL, T3FREE, THYROIDAB in the last 72 hours.  Invalid input(s): FREET3 Anemia work up No results for input(s): VITAMINB12, FOLATE, FERRITIN, TIBC,  IRON, RETICCTPCT in the last 72 hours. Urinalysis    Component Value Date/Time   COLORURINE YELLOW 07/15/2017 2008   APPEARANCEUR HAZY (A) 07/15/2017 2008   LABSPEC 1.016 07/15/2017 2008   PHURINE 6.0 07/15/2017 2008   GLUCOSEU NEGATIVE 07/15/2017 2008   HGBUR MODERATE (A) 07/15/2017 2008   BILIRUBINUR NEGATIVE 07/15/2017 2008   KETONESUR NEGATIVE 07/15/2017 2008   PROTEINUR 30 (A) 07/15/2017 2008   NITRITE NEGATIVE 07/15/2017 2008    LEUKOCYTESUR LARGE (A) 07/15/2017 2008   Sepsis Labs Invalid input(s): PROCALCITONIN,  WBC,  LACTICIDVEN Microbiology Recent Results (from the past 240 hour(s))  Culture, blood (Routine x 2)     Status: None   Collection Time: 07/15/17  6:47 PM  Result Value Ref Range Status   Specimen Description BLOOD LEFT ARM  Final   Special Requests   Final    BOTTLES DRAWN AEROBIC AND ANAEROBIC Blood Culture adequate volume   Culture NO GROWTH 5 DAYS  Final   Report Status 07/20/2017 FINAL  Final  Culture, blood (Routine x 2)     Status: None   Collection Time: 07/15/17  7:00 PM  Result Value Ref Range Status   Specimen Description BLOOD RIGHT FINGER  Final   Special Requests IN PEDIATRIC BOTTLE Blood Culture adequate volume  Final   Culture NO GROWTH 5 DAYS  Final   Report Status 07/20/2017 FINAL  Final  Urine Culture     Status: Abnormal   Collection Time: 07/15/17  8:08 PM  Result Value Ref Range Status   Specimen Description URINE, RANDOM  Final   Special Requests NONE  Final   Culture (A)  Final    >=100,000 COLONIES/mL VANCOMYCIN RESISTANT ENTEROCOCCUS   Report Status 07/18/2017 FINAL  Final   Organism ID, Bacteria VANCOMYCIN RESISTANT ENTEROCOCCUS (A)  Final      Susceptibility   Vancomycin resistant enterococcus - MIC*    AMPICILLIN <=2 SENSITIVE Sensitive     LEVOFLOXACIN >=8 RESISTANT Resistant     NITROFURANTOIN <=16 SENSITIVE Sensitive     VANCOMYCIN >=32 RESISTANT Resistant     LINEZOLID 2 SENSITIVE Sensitive     * >=100,000 COLONIES/mL VANCOMYCIN RESISTANT ENTEROCOCCUS  MRSA PCR Screening     Status: None   Collection Time: 07/16/17  8:46 PM  Result Value Ref Range Status   MRSA by PCR NEGATIVE NEGATIVE Final    Comment:        The GeneXpert MRSA Assay (FDA approved for NASAL specimens only), is one component of a comprehensive MRSA colonization surveillance program. It is not intended to diagnose MRSA infection nor to guide or monitor treatment for MRSA  infections.      Time coordinating discharge: Over 30 minutes  SIGNED:   Jacquelin Hawking, MD [facility] 07/23/2017, 11:28 AM Pager [phone]  If 7PM-7AM, please contact night-coverage [email or web address] Password [email or web address]

## 2017-07-21 NOTE — Progress Notes (Signed)
  Speech Language Pathology Treatment: Dysphagia  Patient Details Name: Gilbert OpitzBobby Rosero MRN: [id number] DOB: [birth year] Today's Date: 07/21/2017 Time: [id number] SLP Time Calculation (min) (ACUTE ONLY): 26 min  Assessment / Plan / Recommendation Clinical Impression  Pt was seen for skilled ST targeting dysphagia goals.  Pt was asleep upon therapist's arrival and needed 10 minutes of environmental modifications, stimulation, and encouragement to achieve adequate alertness for safe PO intake.  Pt consumed dys 1 textures and nectar thick liquids via straw without overt s/s of aspiration.  Pt was encouraged to feed himself to maximize swallowing safety but he conveyed preference for therapist to feed him.  Given lethargy and generalized slowness impacting oral phase, self feeding, and mentation, would recommend that pt remain on his currently prescribed diet.  Pt was left in bed with bed alarm set and call bell within reach.  Continue per current plan of care.    HPI HPI: [name] a 71 y.o.malewith medical history significant of hereditary spastic hemiplegia, CVA with resultant aphasia,DM2 , and HTN, history of ESBL infection. , HTN, DVT, DISH (Diffuse idiopathic skeletal hyperostosis), PEG after CVA admitted due to not eating or drinking x 1 week per family. Found to be hypernatremic and CT of abdomen showed urinary retention with bilateral hydronephrosis. 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 MBS 07/07/16 mild delay, UES residue, regular/thin recommended.       SLP Plan  Continue with current plan of care       Recommendations  Diet recommendations: Dysphagia 1 (puree);Nectar-thick liquid Liquids provided via: Cup;Straw Medication Administration: Crushed with puree Supervision: Patient able to self feed;Full supervision/cueing for  compensatory strategies;Staff to assist with self feeding Compensations: Minimize environmental distractions;Slow rate;Small sips/bites;Multiple dry swallows after each bite/sip Postural Changes and/or Swallow Maneuvers: Seated upright 90 degrees                Oral Care Recommendations: Oral care BID Follow up Recommendations: Skilled Nursing facility SLP Visit Diagnosis: Dysphagia, oropharyngeal phase (R13.12) Plan: Continue with current plan of care       GO                PageMelanee Spry, Elisha Mcgruder L 07/21/2017, 9:14 AM

## 2017-07-21 NOTE — NC FL2 (Signed)
[place] MEDICAID FL2 LEVEL OF CARE SCREENING TOOL     IDENTIFICATION  Patient Name: Gilbert Lewis Birthdate: October 31, 1945 Sex: male Admission Date (Current Location): 07/15/2017  [facility] [place]   Facility and Address:  [facility], [address], [place], [place] [address]      Provider Number: [id number]  Attending Physician Name and Address:  Narda BondsNettey, Ralph A, MD  Relative Name and Phone Number:  Jake Samplesthena, daughter, [phone]    Current Level of Care: Hospital Recommended Level of Care: Skilled Nursing Facility Prior Approval Number:    Date Approved/Denied:   PASRR Number:   [id number]  Discharge Plan: SNF    Current Diagnoses: Patient Active Problem List   Diagnosis Date Noted  . GI bleed 07/16/2017  . Acute urinary retention 07/16/2017  . Melena 07/15/2017  . Sepsis (HCC) 07/15/2017  . Hypernatremia 07/15/2017  . Hypokalemia 07/15/2017  . Dehydration 07/15/2017  . AKI (acute kidney injury) (HCC) 07/15/2017  . Acute lower UTI 06/20/2017  . Orchitis 06/20/2017  . Acute metabolic encephalopathy 06/20/2017  . UTI (urinary tract infection) 06/20/2017  . Subacromial impingement, left 01/19/2017  . Constipation, slow transit 01/18/2017  . Hyperlipidemia LDL goal <70 01/18/2017  . Essential hypertension 01/18/2017  . History of pressure ulcer 12/09/2016  . Moderate protein-calorie malnutrition (HCC) 12/09/2016  . Chronic GERD 12/09/2016  . Spastic hemiplegia affecting dominant side (HCC) 11/22/2016  . Elevated BUN   . Normochromic normocytic anemia   . Subtherapeutic international normalized ratio (INR)   . TIA (transient ischemic attack) 07/07/2016  . Diabetes mellitus with complication (HCC) 07/07/2016  . Dysarthria 07/07/2016  . Seizures (HCC) 07/07/2016  . Stroke-like symptoms   . Pressure ulcer 07/01/2016  . DVT (deep venous thrombosis) (HCC) 06/30/2016  . Slurred speech   . Benign hypertensive heart  disease without heart failure 06/13/2016  . 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 Type 2 diabetes mellitus with stage 3 chronic kidney disease, without long-term current use of insulin (HCC) 06/13/2016  . Major depression, chronic 06/13/2016  . BPH (benign prostatic hyperplasia) 06/13/2016  . OAB (overactive bladder) 06/13/2016  . Anemia, iron deficiency 06/13/2016  . 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 Insomnia 06/13/2016  . Chronic gastric ulcer 06/13/2016  . CKD (chronic kidney disease) stage 3, GFR 30-59 ml/min (HCC) 06/13/2016  . Chronic constipation 06/13/2016  . Chronic hyponatremia 06/13/2016  . Chronic pain syndrome 06/13/2016  . Calculus of gallbladder and bile duct with obstruction without cholecystitis 06/13/2016  . History of DVT (deep vein thrombosis) 06/13/2016    Orientation RESPIRATION BLADDER Height & Weight     Self, Place  Normal Continent, Indwelling catheter Weight: 68.9 kg (151 lb 14.4 oz) Height:  5\' 11"  (180.3 cm)  BEHAVIORAL SYMPTOMS/MOOD NEUROLOGICAL BOWEL NUTRITION STATUS      Incontinent Diet (Please see DC Summary)  AMBULATORY STATUS COMMUNICATION OF NEEDS Skin   Extensive Assist Verbally PU Stage and Appropriate Care (Stage I on sacrum)                       Personal Care Assistance Level of Assistance  Bathing, Feeding, Dressing Bathing Assistance: Maximum assistance Feeding assistance: Maximum assistance Dressing Assistance: Maximum assistance     Functional Limitations Info  Speech     Speech Info: Impaired    SPECIAL CARE FACTORS  FREQUENCY   (Oral care)                    Contractures      Additional Factors Info  Code Status, Allergies, Insulin Sliding Scale, Isolation Precautions Code Status Info: Full Allergies Info: Latex, Macrobid Nitrofurantoin Monohyd  Macro, Morphine And Related   Insulin Sliding Scale Info: 3x daily with meals and at bedtime Isolation Precautions Info: VRE     Current Medications (07/21/2017):  This is the current hospital active medication list Current Facility-Administered Medications  Medication Dose Route Frequency Provider Last Rate Last Dose  . acetaminophen (TYLENOL) tablet 650 mg  650 mg Oral Q6H PRN Therisa Doyne, MD   650 mg at 07/21/17 0011   Or  . acetaminophen (TYLENOL) suppository 650 mg  650 mg Rectal Q6H PRN Doutova, Anastassia, MD      . amoxicillin (AMOXIL) capsule 500 mg  500 mg Oral Q8H Scarlett Presto, RPH   500 mg at 07/21/17 0518  . chlorhexidine (PERIDEX) 0.12 % solution 15 mL  15 mL Mouth Rinse BID Rolly Salter, MD   15 mL at 07/21/17 0936  . enoxaparin (LOVENOX) injection 70 mg  1 mg/kg Subcutaneous Q12H Narda Bonds, MD   70 mg at 07/21/17 0518  . feeding supplement (GLUCERNA SHAKE) (GLUCERNA SHAKE) liquid 237 mL  237 mL Oral TID BM Narda Bonds, MD   237 mL at 07/21/17 0933  . folic acid (FOLVITE) tablet 1 mg  1 mg Oral Daily Scarlett Presto, RPH   1 mg at 07/21/17 0936  . hydrocortisone (ANUSOL-HC) 2.5 % rectal cream 1 application  1 application Topical QID PRN Rolly Salter, MD   1 application at 07/18/17 0015  . insulin aspart (novoLOG) injection 0-5 Units  0-5 Units Subcutaneous QHS Lynden Oxford M, MD      . insulin aspart (novoLOG) injection 0-9 Units  0-9 Units Subcutaneous TID WC Rolly Salter, MD   2 Units at 07/20/17 1814  . levETIRAcetam (KEPPRA) 100 MG/ML solution 250 mg  250 mg Oral BID Scarlett Presto, RPH   250 mg at 07/21/17 0935  . lip balm (BLISTEX) ointment 1 application  1 application Topical PRN Rolly Salter, MD   1 application at 07/18/17 1025  . MEDLINE mouth rinse  15 mL Mouth Rinse q12n4p Rolly Salter, MD   15 mL at 07/20/17 1200  . methocarbamol (ROBAXIN) tablet 500 mg  500 mg Oral Q8H PRN Narda Bonds, MD   500 mg at 07/21/17 0115  .  multivitamin with minerals tablet 1 tablet  1 tablet Oral Daily Narda Bonds, MD   1 tablet at 07/21/17 0936  . nystatin (MYCOSTATIN) 100000 UNIT/ML suspension 500,000 Units  5 mL Oral QID Rolly Salter, MD   500,000 Units at 07/21/17 0934  . ondansetron (ZOFRAN) tablet 4 mg  4 mg Oral Q6H PRN Doutova, Anastassia, MD       Or  . ondansetron (ZOFRAN) injection 4 mg  4 mg Intravenous Q6H PRN Doutova, Anastassia, MD   4 mg at 07/20/17 0600  . pantoprazole sodium (PROTONIX) 40 mg/20 mL oral suspension 40 mg  40 mg Oral BID Scarlett Presto, RPH   40 mg at 07/21/17 1610  . RESOURCE THICKENUP CLEAR   Oral PRN Rolly Salter, MD      . tamsulosin [facility]) capsule 0.4 mg  0.4 mg Oral QPC supper Therisa Doyne, MD  0.4 mg at 07/20/17 1815  . thiamine (VITAMIN B-1) tablet 100 mg  100 mg Oral Daily Narda Bonds, MD   100 mg at 07/21/17 0935  . warfarin (COUMADIN) tablet 4 mg  4 mg Oral ONCE-1800 Narda Bonds, MD      . Warfarin - Pharmacist Dosing Inpatient   Does not apply q1800 Narda Bonds, MD         Discharge Medications: Please see discharge summary for a list of discharge medications.  Relevant Imaging Results:  Relevant Lab Results:   Additional Information SSN: [id number]        Patient to be followed by Hospice at [facility].  Mearl Latin, LCSWA

## 2017-07-22 LAB — RENAL FUNCTION PANEL
ANION GAP: 6 (ref 5–15)
Albumin: 2.3 g/dL — ABNORMAL LOW (ref 3.5–5.0)
Albumin: 2.4 g/dL — ABNORMAL LOW (ref 3.5–5.0)
Anion gap: 9 (ref 5–15)
BUN: 26 mg/dL — ABNORMAL HIGH (ref 6–20)
BUN: 25 mg/dL — ABNORMAL HIGH (ref 6–20)
CALCIUM: 8 mg/dL — AB (ref 8.9–10.3)
CALCIUM: 8 mg/dL — AB (ref 8.9–10.3)
CO2: 23 mmol/L (ref 22–32)
CO2: 24 mmol/L (ref 22–32)
CREATININE: 1.11 mg/dL (ref 0.61–1.24)
CREATININE: 1.09 mg/dL (ref 0.61–1.24)
Chloride: 111 mmol/L (ref 101–111)
Chloride: 111 mmol/L (ref 101–111)
Glucose, Bld: 98 mg/dL (ref 65–99)
Glucose, Bld: 129 mg/dL — ABNORMAL HIGH (ref 65–99)
Phosphorus: 3.2 mg/dL (ref 2.5–4.6)
Phosphorus: 3.4 mg/dL (ref 2.5–4.6)
Potassium: 3.6 mmol/L (ref 3.5–5.1)
Potassium: 3.7 mmol/L (ref 3.5–5.1)
SODIUM: 143 mmol/L (ref 135–145)
SODIUM: 141 mmol/L (ref 135–145)

## 2017-07-22 LAB — CBC
HEMATOCRIT: 27.6 % — AB (ref 39.0–52.0)
HEMOGLOBIN: 8.8 g/dL — AB (ref 13.0–17.0)
MCH: 26.8 pg (ref 26.0–34.0)
MCHC: 31.9 g/dL (ref 30.0–36.0)
MCV: 84.1 fL (ref 78.0–100.0)
Platelets: 268 10*3/uL (ref 150–400)
RBC: 3.28 MIL/uL — ABNORMAL LOW (ref 4.22–5.81)
RDW: 14.1 % (ref 11.5–15.5)
WBC: 7.9 10*3/uL (ref 4.0–10.5)

## 2017-07-22 LAB — PROTIME-INR
INR: 1.17
Prothrombin Time: 14.8 seconds (ref 11.4–15.2)

## 2017-07-22 LAB — GLUCOSE, CAPILLARY
GLUCOSE-CAPILLARY: 117 mg/dL — AB (ref 65–99)
GLUCOSE-CAPILLARY: 181 mg/dL — AB (ref 65–99)
GLUCOSE-CAPILLARY: 127 mg/dL — AB (ref 65–99)
Glucose-Capillary: 130 mg/dL — ABNORMAL HIGH (ref 65–99)

## 2017-07-22 MED ORDER — WARFARIN SODIUM 4 MG PO TABS
4.0000 mg | ORAL_TABLET | Freq: Once | ORAL | Status: AC
  Administered 2017-07-22: 4 mg via ORAL
  Filled 2017-07-22: qty 1

## 2017-07-22 NOTE — Progress Notes (Signed)
ANTICOAGULATION CONSULT NOTE - Follow up Consult  Pharmacy Consult for warfarin restart with Lovenox bridge Indication: hx of DVT and chronic DVT  Allergies  Allergen Reactions  . Latex Other (See Comments)    Per MAR  . Macrobid [employer][Nitrofurantoin Monohyd Macro] Other (See Comments)    Per MAR  . Morphine And Related Other (See Comments)    [facility]    Patient Measurements: Height: 5\' 11"  (180.3 cm) Weight: 151 lb 14.4 oz (68.9 kg) IBW/kg (Calculated) : 75.3  Vital Signs: Temp: 97.8 F (36.6 C) (10/20 0623) Temp Source: Oral (10/20 0623) BP: 128/70 (10/20 0623) Pulse Rate: 82 (10/20 0623)  Labs:  Recent Labs  07/20/17 1356  07/21/17 0440 07/21/17 1208 07/21/17 2055 07/22/17 0429  HGB  --   --  8.7*  --   --  8.8*  HCT  --   --  27.0*  --   --  27.6*  PLT  --   --  232  --   --  268  LABPROT 14.5  --  14.5  --   --  14.8  INR 1.13  --  1.14  --   --  1.17  CREATININE  --   < > 1.20  1.22 1.20 1.23 1.11  < > = values in this interval not displayed.  Estimated Creatinine Clearance: 60.3 mL/min (by C-G formula based on SCr of 1.11 mg/dL).   Medical History: Past Medical History:  Diagnosis Date  . Cataract   . CKD (chronic kidney disease)   . Depression   . Diabetes mellitus without complication (HCC)   . Diabetic retinopathy (HCC)   . Hypertension   . Stroke [facility](HCC)     Assessment: 71yo male on warfarin PTA for hx DVT (Pt has history of DVT on Xarelto), warfarin was held at nursing home x 1 week PTA. Patient was not eating or drinking and had blood in his stool. Per nursing home records, patient received vitamin K IM doses 2.5mg  on 10/8 and 5 mg on 10/10 d/t INR of 7.2. INR on admission 10/13 was 1.24, and he was Hemoccult positive. Pt on a dysphagia nectar thick liquid diet and PO intake has increased, had 3 x glucerna shakes 10/19. No evidence of continued bleeding per medical record. Patient will likely require a much lower warfarin dose than PTA d/t drastic  change in diet and PO intake.  INR is subtherapeutic today as expected at 1.17. Hgb seems stable today at 8.8, platelets wnl. No bleeding noted.  PTA dose: warfarin 5 mg on Sat/Sun and 4 mg all other days (per nursing home)  Goal of Therapy:  INR 2-3 Monitor platelets by anticoagulation protocol: Yes   Plan:  Give warfarin 4 mg po x 1 Continue Lovenox 1 mg/kg q12 hours x 5 days and 2 therapeutic INRs Monitor daily INR, CBC, clinical course, s/sx of bleed, PO intake, DDI   Thank you for allowing us to participate in this patients care.  Signe Coltonya C Tyshawn Ciullo, PharmD Clinical phone for 07/22/2017 from 7a-3:30p: [phone] If after 3:30p, please call main pharmacy at: [phone] 07/22/2017 8:07 AM

## 2017-07-22 NOTE — Progress Notes (Addendum)
Removed foley per MD order around 1500. Patient still has not voided, will continue to monitor.

## 2017-07-22 NOTE — Progress Notes (Signed)
PROGRESS NOTE    Gilbert Lewis  ZOX:[id number] DOB: 18-Oct-1945 DOA: 07/15/2017 PCP: Patient, No Pcp Per   Brief Narrative: Gilbert Lewis is a 71 y.o. with a PMH of CVA, hemiplegia, aphasia, type II DM, HTN, DVT; admitted on 07/15/2017, presented with complaint of confusion and poor by mouth intake, was found to have hyponatremia, acute kidney injury, suspected sepsis. Currently further plan is continue his IV antibiotics and IV fluids.   Assessment & Plan:   Active Problems:   Aphasia, late effect of cerebrovascular disease   Anemia, iron deficiency   CKD (chronic kidney disease) stage 3, GFR 30-59 ml/min (HCC)   Pressure ulcer   Diabetes mellitus with complication (HCC)   History of pressure ulcer   Moderate protein-calorie malnutrition (HCC)   Essential hypertension   Acute lower UTI   Melena   Sepsis (HCC)   Hypernatremia   Hypokalemia   Dehydration   AKI (acute kidney injury) (HCC)   GI bleed   Acute urinary retention   Sepsis secondary to UTI VRE UTI -continue amoxicillin (10 day course)  GI bleed Hemoccult positive. Hemoglobin stable. GI consulted without recommendations currently. No evidence of continued bleeding. -continue coumadin with Lovenox bridge  Hypernatremia Likely secondary to poor PO intake. Resolved. -oral nutrition intake  Acute kidney injury on CKD stage III Resolved.  Hypokalemia Resolved with repletion.  Metabolic acidosis Likely secondary to acute kidney injury.  Resolved.  Type 2 diabetes mellitus Controlled. -continue SSI  Essential hypertension Normotensive  Acute urinary retention Secondary to UTI. S/p urethral catheter -voiding trial today (discussed with the nurse)  Right lower leg edema Repeat doppler significant for chronic DVT  History of seizure -continue Keppra  Dysphagia Secondary to history of stroke. Stable.    DVT prophylaxis: Coumadin with Lovenox bridge Code Status: Full code Family Communication:  None at bedside Disposition Plan: Medically stable for discharge, discharge to SNF when bed available   Consultants:   Gastroenterology  Procedures:   None  Antimicrobials:  Vancomycin  Zosyn  Meropenem  Ampicillin  Amoxicillin   Subjective: Afebrile.  He reports no pain.  Objective: Vitals:   07/20/17 2026 07/21/17 0539 07/21/17 2131 07/22/17 0623  BP: 125/66 115/63 127/63 128/70  Pulse: (!) 102 79 91 82  Resp: 16 16 20 18   Temp: 98.8 F (37.1 C) 98.2 F (36.8 C) 99.1 F (37.3 C) 97.8 F (36.6 C)  TempSrc: Oral Oral Oral Oral  SpO2: 100% 100% 100% 99%  Weight:      Height:        Intake/Output Summary (Last 24 hours) at 07/22/17 1338 Last data filed at 07/22/17 1222  Gross per 24 hour  Intake              500 ml  Output             1700 ml  Net            -1200 ml   Filed Weights   07/15/17 1838 07/16/17 2016  Weight: 72.6 kg (160 lb) 68.9 kg (151 lb 14.4 oz)    Examination:  General exam: Appears calm and comfortable Respiratory system: Clear to auscultation. Respiratory effort normal. Cardiovascular system: Regular rate and rhythm. Normal S1 and S2. No heart murmurs present. No extra heart sounds Gastrointestinal system: Abdomen is nondistended, soft and nontender. Normal bowel sounds heard. Central nervous system: Alert Extremities: No calf tenderness    Data Reviewed: I have personally reviewed following labs and imaging studies  CBC:  Recent Labs Lab 07/15/17 1847  07/16/17 2052 07/18/17 0538 07/19/17 0228 07/21/17 0440 07/22/17 0429  WBC 14.2*  < > 12.7* 13.6* 12.2* 9.6 7.9  NEUTROABS 10.5*  --   --   --  8.8*  --   --   HGB 12.6*  < > 10.7* 10.8* 10.1* 8.7* 8.8*  HCT 40.9  < > 35.2* 33.5* 30.9* 27.0* 27.6*  MCV 87.6  < > 88.4 85.9 85.8 83.9 84.1  PLT 289  < > 205 170 175 232 268  < > = values in this interval not displayed. Basic Metabolic Panel:  Recent Labs Lab 07/16/17 2052 07/17/17 0336  07/19/17 0228   07/20/17 2050 07/21/17 0440 07/21/17 0823 07/21/17 1208 07/21/17 2055 07/22/17 0429  NA 154* 157*  < > 148*  < > 142 142  142  --  142 138 143  K 3.2* 3.7  < > 3.2*  < > 3.5 3.2*  3.2*  --  3.5 4.1 3.6  CL 129* >130*  < > 122*  < > 113* 112*  112*  --  113* 110 111  CO2 19* 19*  < > 21*  < > 22 23  22   --  23 21* 23  GLUCOSE 124* 111*  < > 99  < > 169* 107*  107*  --  166* 153* 98  BUN 63* 56*  < > 34*  < > 27* 28*  28*  --  28* 28* 26*  CREATININE 1.70* 1.64*  < > 1.31*  < > 1.25* 1.20  1.22  --  1.20 1.23 1.11  CALCIUM 7.7* 7.9*  < > 7.7*  < > 7.7* 7.8*  7.7*  --  7.8* 7.7* 8.0*  MG 1.6* 2.0  --  1.4*  --   --   --  1.5*  --   --   --   PHOS 2.1* 2.1*  < > 2.5  < > 2.7 3.4  --  3.8 3.2 3.2  < > = values in this interval not displayed. GFR: Estimated Creatinine Clearance: 60.3 mL/min (by C-G formula based on SCr of 1.11 mg/dL). Liver Function Tests:  Recent Labs Lab 07/15/17 1847  07/17/17 0336  07/19/17 0228  07/20/17 2050 07/21/17 0440 07/21/17 1208 07/21/17 2055 07/22/17 0429  AST 19  --  18  --  14*  --   --   --   --   --   --   ALT 10*  --  9*  --  9*  --   --   --   --   --   --   ALKPHOS 67  --  65  --  63  --   --   --   --   --   --   BILITOT 1.0  --  0.9  --  0.6  --   --   --   --   --   --   PROT 7.1  --  6.0*  --  5.8*  --   --   --   --   --   --   ALBUMIN 2.7*  < > 2.4*  < > 2.3*  < > 2.3* 2.3* 2.3* 2.3* 2.3*  < > = values in this interval not displayed.  Recent Labs Lab 07/16/17 0343  LIPASE 55*   No results for input(s): AMMONIA in the last 168 hours. Coagulation Profile:  Recent Labs Lab 07/15/17 1847 07/17/17 0336 07/20/17 1356  07/21/17 0440 07/22/17 0429  INR 1.24 1.24 1.13 1.14 1.17   Cardiac Enzymes:  Recent Labs Lab 07/15/17 2124 07/16/17 2052 07/17/17 0336  CKTOTAL 78  --   --   TROPONINI  --  <0.03 <0.03   BNP (last 3 results) No results for input(s): PROBNP in the last 8760 hours. HbA1C: No results for input(s):  HGBA1C in the last 72 hours. CBG:  Recent Labs Lab 07/21/17 1215 07/21/17 1748 07/21/17 2107 07/22/17 0750 07/22/17 1218  GLUCAP 157* 187* 173* 130* 117*   Lipid Profile: No results for input(s): CHOL, HDL, LDLCALC, TRIG, CHOLHDL, LDLDIRECT in the last 72 hours. Thyroid Function Tests: No results for input(s): TSH, T4TOTAL, FREET4, T3FREE, THYROIDAB in the last 72 hours. Anemia Panel: No results for input(s): VITAMINB12, FOLATE, FERRITIN, TIBC, IRON, RETICCTPCT in the last 72 hours. Sepsis Labs:  Recent Labs Lab 07/15/17 2128 07/17/17 0336  LATICACIDVEN 0.66 0.9    Recent Results (from the past 240 hour(s))  Culture, blood (Routine x 2)     Status: None   Collection Time: 07/15/17  6:47 PM  Result Value Ref Range Status   Specimen Description BLOOD LEFT ARM  Final   Special Requests   Final    BOTTLES DRAWN AEROBIC AND ANAEROBIC Blood Culture adequate volume   Culture NO GROWTH 5 DAYS  Final   Report Status 07/20/2017 FINAL  Final  Culture, blood (Routine x 2)     Status: None   Collection Time: 07/15/17  7:00 PM  Result Value Ref Range Status   Specimen Description BLOOD RIGHT FINGER  Final   Special Requests IN PEDIATRIC BOTTLE Blood Culture adequate volume  Final   Culture NO GROWTH 5 DAYS  Final   Report Status 07/20/2017 FINAL  Final  Urine Culture     Status: Abnormal   Collection Time: 07/15/17  8:08 PM  Result Value Ref Range Status   Specimen Description URINE, RANDOM  Final   Special Requests NONE  Final   Culture (A)  Final    >=100,000 COLONIES/mL VANCOMYCIN RESISTANT ENTEROCOCCUS   Report Status 07/18/2017 FINAL  Final   Organism ID, Bacteria VANCOMYCIN RESISTANT ENTEROCOCCUS (A)  Final      Susceptibility   Vancomycin resistant enterococcus - MIC*    AMPICILLIN <=2 SENSITIVE Sensitive     LEVOFLOXACIN >=8 RESISTANT Resistant     NITROFURANTOIN <=16 SENSITIVE Sensitive     VANCOMYCIN >=32 RESISTANT Resistant     LINEZOLID 2 SENSITIVE Sensitive      * >=100,000 COLONIES/mL VANCOMYCIN RESISTANT ENTEROCOCCUS  MRSA PCR Screening     Status: None   Collection Time: 07/16/17  8:46 PM  Result Value Ref Range Status   MRSA by PCR NEGATIVE NEGATIVE Final    Comment:        The GeneXpert MRSA Assay (FDA approved for NASAL specimens only), is one component of a comprehensive MRSA colonization surveillance program. It is not intended to diagnose MRSA infection nor to guide or monitor treatment for MRSA infections.          Radiology Studies: No results found.      Scheduled Meds: . amoxicillin  500 mg Oral Q8H  . chlorhexidine  15 mL Mouth Rinse BID  . enoxaparin (LOVENOX) injection  1 mg/kg Subcutaneous Q12H  . feeding supplement (GLUCERNA SHAKE)  237 mL Oral TID BM  . folic acid  1 mg Oral Daily  . insulin aspart  0-5 Units Subcutaneous QHS  . insulin aspart  0-9 Units Subcutaneous TID WC  . levETIRAcetam  250 mg Oral BID  . mouth rinse  15 mL Mouth Rinse q12n4p  . multivitamin with minerals  1 tablet Oral Daily  . nystatin  5 mL Oral QID  . pantoprazole sodium  40 mg Oral BID  . tamsulosin  0.4 mg Oral QPC supper  . thiamine  100 mg Oral Daily  . warfarin  4 mg Oral ONCE-1800  . Warfarin - Pharmacist Dosing Inpatient   Does not apply q1800   Continuous Infusions:    LOS: 6 days     Jacquelin Hawking, MD [facility] 07/22/2017, 1:38 PM Pager: [phone]  If 7PM-7AM, please contact night-coverage [email or web address] Password [email or web address] 07/22/2017, 1:38 PM

## 2017-07-22 NOTE — Progress Notes (Signed)
Per the first shift nurse Elisa pt urethra catheter was taken out around 3pm pt has"nt  pee up to this time bladder scan done retaining 390 on call physician page put an order for foley placement,  on physical assessment penis is swollen, redness and sore at the tip of penis, order carried out  will continue to monitor pt

## 2017-07-22 NOTE — Progress Notes (Signed)
[facility]. CSW will check back in tomorrow.  Osborne Cascoadia Cyara Devoto LCSWA [phone]

## 2017-07-23 LAB — CBC
HCT: 28.6 % — ABNORMAL LOW (ref 39.0–52.0)
Hemoglobin: 9.1 g/dL — ABNORMAL LOW (ref 13.0–17.0)
MCH: 27.1 pg (ref 26.0–34.0)
MCHC: 31.8 g/dL (ref 30.0–36.0)
MCV: 85.1 fL (ref 78.0–100.0)
PLATELETS: 335 10*3/uL (ref 150–400)
RBC: 3.36 MIL/uL — AB (ref 4.22–5.81)
RDW: 14.2 % (ref 11.5–15.5)
WBC: 7.2 10*3/uL (ref 4.0–10.5)

## 2017-07-23 LAB — RENAL FUNCTION PANEL
ALBUMIN: 2.5 g/dL — AB (ref 3.5–5.0)
ALBUMIN: 2.4 g/dL — AB (ref 3.5–5.0)
ANION GAP: 7 (ref 5–15)
Anion gap: 2 — ABNORMAL LOW (ref 5–15)
BUN: 24 mg/dL — AB (ref 6–20)
BUN: 24 mg/dL — AB (ref 6–20)
CO2: 24 mmol/L (ref 22–32)
CO2: 26 mmol/L (ref 22–32)
CREATININE: 1.13 mg/dL (ref 0.61–1.24)
Calcium: 8.2 mg/dL — ABNORMAL LOW (ref 8.9–10.3)
Calcium: 8.1 mg/dL — ABNORMAL LOW (ref 8.9–10.3)
Chloride: 111 mmol/L (ref 101–111)
Chloride: 111 mmol/L (ref 101–111)
Creatinine, Ser: 1.12 mg/dL (ref 0.61–1.24)
GFR calc Af Amer: >60 mL/min (ref >60)
Glucose, Bld: 100 mg/dL — ABNORMAL HIGH (ref 65–99)
Glucose, Bld: 142 mg/dL — ABNORMAL HIGH (ref 65–99)
PHOSPHORUS: 3.2 mg/dL (ref 2.5–4.6)
PHOSPHORUS: 3.2 mg/dL (ref 2.5–4.6)
POTASSIUM: 3.6 mmol/L (ref 3.5–5.1)
Potassium: 3.8 mmol/L (ref 3.5–5.1)
Sodium: 142 mmol/L (ref 135–145)
Sodium: 139 mmol/L (ref 135–145)

## 2017-07-23 LAB — PROTIME-INR
INR: 1.13
PROTHROMBIN TIME: 14.4 s (ref 11.4–15.2)

## 2017-07-23 LAB — GLUCOSE, CAPILLARY
GLUCOSE-CAPILLARY: 114 mg/dL — AB (ref 65–99)
Glucose-Capillary: 120 mg/dL — ABNORMAL HIGH (ref 65–99)
Glucose-Capillary: 137 mg/dL — ABNORMAL HIGH (ref 65–99)

## 2017-07-23 MED ORDER — HYDROCORTISONE 2.5 % RE CREA: 1.0000 "application " | TOPICAL_CREAM | Freq: Four times a day (QID) | RECTAL | 0 refills | Status: AC | PRN

## 2017-07-23 MED ORDER — WARFARIN SODIUM 7.5 MG PO TABS: 7.5000 mg | ORAL_TABLET | Freq: Once | ORAL | Status: DC

## 2017-07-23 MED ORDER — GLUCERNA SHAKE PO LIQD: 237.0000 mL | Freq: Three times a day (TID) | ORAL | 0 refills | Status: AC

## 2017-07-23 MED ORDER — AMOXICILLIN 500 MG PO CAPS: 500.0000 mg | ORAL_CAPSULE | Freq: Three times a day (TID) | ORAL | Status: AC

## 2017-07-23 MED ORDER — ENOXAPARIN SODIUM 80 MG/0.8ML ~~LOC~~ SOLN: 1.0000 mg/kg | Freq: Two times a day (BID) | SUBCUTANEOUS | Status: AC

## 2017-07-23 NOTE — Progress Notes (Signed)
[facility] Anticipated discharge date: 07/23/17 Family notified: Yes, by phone Transportation by: PTAR  Report #: [phone], Room 802; in [facility]  CSW signing off.  Blenda Nicelylizabeth Tyreon Frigon LCSW [phone]

## 2017-07-23 NOTE — Progress Notes (Signed)
[name] to be D/C'd to [facility] per MD order.   Allergies as of 07/23/2017      Reactions   Latex Other (See Comments)   Per MAR   Macrobid [nitrofurantoin Monohyd Macro] Other (See Comments)   Per MAR   Morphine And Related Other (See Comments)   Per MAR      Medication List    STOP taking these medications   baclofen 10 MG tablet Commonly known as:  LIORESAL   INVANZ 1 g injection Generic drug:  ertapenem   LORazepam 0.5 MG tablet Commonly known as:  ATIVAN   Oxycodone HCl 10 MG Tabs   phytonadione 10 MG/ML injection Commonly known as:  VITAMIN K   phytonadione 2 MG/ML Soln injection Commonly known as:  VITAMIN K     TAKE these medications   acetaminophen 325 MG tablet Commonly known as:  TYLENOL Take 650 mg by mouth every 4 (four) hours as needed for mild pain.   amoxicillin 500 MG capsule Commonly known as:  AMOXIL Take 1 capsule (500 mg total) by mouth every 8 (eight) hours. End date 07/24/17   atorvastatin 10 MG tablet Commonly known as:  LIPITOR Take 1 tablet (10 mg total) by mouth daily at 6 PM. What changed:  when to take this   CARAFATE 1 g tablet Generic drug:  sucralfate Take 1 g by mouth 4 (four) times daily.   DECUBI-VITE Caps Take 1 capsule by mouth daily.   docusate sodium 100 MG capsule Commonly known as:  COLACE Take 100 mg by mouth 2 (two) times daily.   enoxaparin 80 MG/0.8ML injection Commonly known as:  LOVENOX Inject 0.7 mLs (70 mg total) into the skin every 12 (twelve) hours.   feeding supplement (GLUCERNA SHAKE) Liqd Take 237 mLs by mouth 3 (three) times daily between meals.   feeding supplement (PRO-STAT SUGAR FREE 64) Liqd Take 30 mLs by mouth 2 (two) times daily.   ferrous sulfate 325 (65 FE) MG tablet Take 325 mg by mouth 2 (two) times daily with a meal.   finasteride 5 MG tablet Commonly known as:  PROSCAR Take 5 mg by mouth every evening.   hydrocortisone 2.5 % rectal cream Commonly known as:   ANUSOL-HC Apply 1 application topically 4 (four) times daily as needed for hemorrhoids.   Lavender Oil Oil 1 drop by Does not apply route as needed. Apply one drop behind each ear and on pillowcase as needed for agitation, anxiety, restlessness, insomnia. Discontinue use if skin irritation occurs   levETIRAcetam 250 MG tablet Commonly known as:  KEPPRA Take 250 mg by mouth 2 (two) times daily.   losartan 50 MG tablet Commonly known as:  COZAAR Take 0.5 tablets (25 mg total) by mouth daily.   Melatonin 10 MG Tabs Take 10 mg by mouth at bedtime.   metFORMIN 1000 MG tablet Commonly known as:  GLUCOPHAGE Take 500 mg by mouth 2 (two) times daily with a meal.   metoprolol tartrate 25 MG tablet Commonly known as:  LOPRESSOR Take 0.5 tablets (12.5 mg total) by mouth 2 (two) times daily.   nystatin powder Commonly known as:  MYCOSTATIN/NYSTOP Apply topically 4 (four) times daily. to scrotum/perineum.   omeprazole 20 MG capsule Commonly known as:  PRILOSEC Take 20 mg by mouth daily.   ondansetron 4 MG tablet Commonly known as:  ZOFRAN Take 4 mg by mouth every 8 (eight) hours as needed for nausea or vomiting.   polyethylene glycol packet Commonly  known as:  MIRALAX / GLYCOLAX Take 17 g by mouth 2 (two) times daily.   PROBIOTIC ACIDOPHILUS Tabs Take 1 tablet by mouth 2 (two) times daily.   PROCEL Powd Take 1 scoop by mouth 2 (two) times daily.   QUEtiapine 50 MG tablet Commonly known as:  SEROQUEL Take 50 mg by mouth at bedtime.   senna 8.6 MG tablet Commonly known as:  SENOKOT Take 2 tablets by mouth 2 (two) times daily.   sertraline 100 MG tablet Commonly known as:  ZOLOFT Take 150 mg by mouth daily.   solifenacin 5 MG tablet Commonly known as:  VESICARE Take 5 mg by mouth daily.   SYSTANE BALANCE 0.6 % Soln Generic drug:  Propylene Glycol Apply 1 drop to eye 2 (two) times daily.   tamsulosin 0.4 MG Caps capsule Commonly known as:  FLOMAX Take 0.4 mg by  mouth daily after supper.   ursodiol 500 MG tablet Commonly known as:  ACTIGALL Take 500 mg by mouth 2 (two) times daily.   warfarin 5 MG tablet Commonly known as:  COUMADIN Take 5 mg by mouth See admin instructions. Take 1 tablet (5mg ) by mouth on Sat, Sun   warfarin 4 MG tablet Commonly known as:  COUMADIN Take 4 mg by mouth See admin instructions. Take 1 tablet (4mg ) by mouth everyday on Mon, Tue, Wed, Thur, Fri.       VVS, Skin clean, dry and intact. IV catheters discontinued intact. Site without signs and symptoms of complications. Dressing and pressure applied.  An After Visit Summary was printed and given to transport.  Patient escorted via stretcher, and [facility]. Jon Gillslisa R Mona Ayars  07/23/2017 4:43 PM

## 2017-07-23 NOTE — Progress Notes (Signed)
Pt refused lab drawn.

## 2017-07-23 NOTE — Progress Notes (Signed)
ANTICOAGULATION CONSULT NOTE - Follow up Consult  Pharmacy Consult for warfarin restart with Lovenox bridge Indication: hx of DVT and chronic DVT  Allergies  Allergen Reactions  . Latex Other (See Comments)    Per MAR  . Macrobid [employer][Nitrofurantoin Monohyd Macro] Other (See Comments)    Per MAR  . Morphine And Related Other (See Comments)    [facility]    Patient Measurements: Height: 5\' 11"  (180.3 cm) Weight: 151 lb 14.4 oz (68.9 kg) IBW/kg (Calculated) : 75.3  Vital Signs: Temp: 98.4 F (36.9 C) (10/21 0500) Temp Source: Oral (10/21 0500) BP: 120/64 (10/21 0500) Pulse Rate: 84 (10/21 0500)  Labs:  Recent Labs  07/21/17 0440  07/22/17 0429 07/22/17 1318 07/23/17 0521  HGB 8.7*  --  8.8*  --  9.1*  HCT 27.0*  --  27.6*  --  28.6*  PLT 232  --  268  --  335  LABPROT 14.5  --  14.8  --  14.4  INR 1.14  --  1.17  --  1.13  CREATININE 1.20  1.22  < > 1.11 1.09 1.12  < > = values in this interval not displayed.  Estimated Creatinine Clearance: 59.8 mL/min (by C-G formula based on SCr of 1.12 mg/dL).   Medical History: Past Medical History:  Diagnosis Date  . Cataract   . CKD (chronic kidney disease)   . Depression   . Diabetes mellitus without complication (HCC)   . Diabetic retinopathy (HCC)   . Hypertension   . Stroke [facility](HCC)     Assessment: 71yo male on warfarin PTA for hx DVT (Pt has history of DVT on Xarelto), warfarin was held at nursing home x 1 week PTA. Patient was not eating or drinking and had blood in his stool. Per nursing home records, patient received vitamin K IM doses 2.5mg  on 10/8 and 5 mg on 10/10 d/t INR of 7.2. INR on admission 10/13 was 1.24, and he was Hemoccult positive. Pt on a dysphagia nectar thick liquid diet and PO intake has increased, had 3 x glucerna shakes 10/19. No evidence of continued bleeding per medical record.  INR remains subtherapeutic at 1.13. Hgb stable, platelets wnl. No bleeding noted.  PTA dose: warfarin 5 mg on Sat/Sun  and 4 mg all other days (per nursing home)  Goal of Therapy:  INR 2-3 Monitor platelets by anticoagulation protocol: Yes   Plan:  Give warfarin 7.5 mg po x 1 Continue Lovenox 1 mg/kg q12 hours x 5 days and 2 therapeutic INRs Monitor daily INR, CBC, clinical course, s/sx of bleed, PO intake, DDI   Thank you for allowing us to participate in this patients care.  Signe Coltonya C Analeya Luallen, PharmD Clinical phone for 07/23/2017 from 7a-3:30p: [phone] If after 3:30p, please call main pharmacy at: [phone] 07/23/2017 7:31 AM

## 2017-07-25 ENCOUNTER — Ambulatory Visit: Payer: Medicare Other | Admitting: Physical Medicine & Rehabilitation

## 2017-08-15 ENCOUNTER — Ambulatory Visit: Payer: Medicare Other | Admitting: Physical Medicine & Rehabilitation

## 2017-09-02 DEATH — deceased

## 2018-03-14 IMAGING — CT CT HEAD W/O CM
6 of 14 series · 16 of 47 positions shown, 17 images · non-contrast
Comparison: CT of the head on 07/07/2016

CLINICAL DATA: Altered mental status and epistaxis. History of
cerebral infarction.

EXAM:
CT HEAD WITHOUT CONTRAST
CT MAXILLOFACIAL WITHOUT CONTRAST
TECHNIQUE: Multidetector CT imaging of the head and maxillofacial structures
were performed using the standard protocol without intravenous
contrast. Multiplanar CT image reconstructions of the maxillofacial
structures were also generated.

[Series 9: maxilllofacial 2.0 hr40 3 · axial · 0.43mm/px · z∈[-372,-284]mm · 3 of 90 slices shown, 4 images]
[im 23/90  brain]
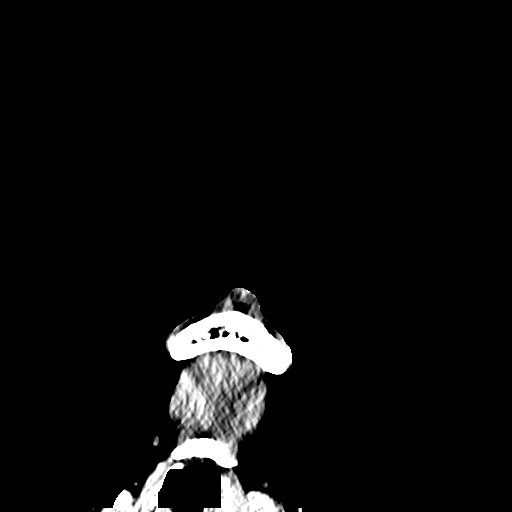
[im 23/90  bone]
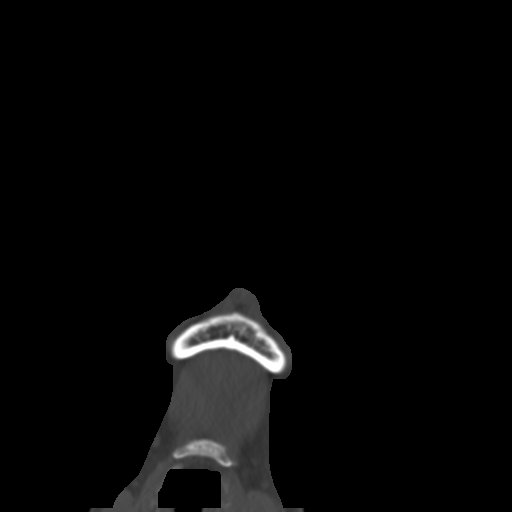
[im 45/90  brain]
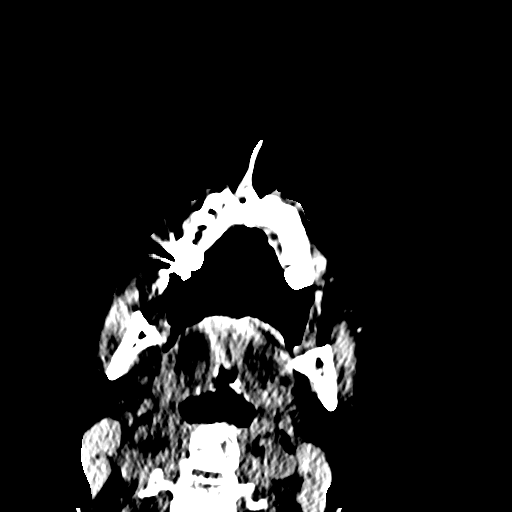
[im 67/90  brain]
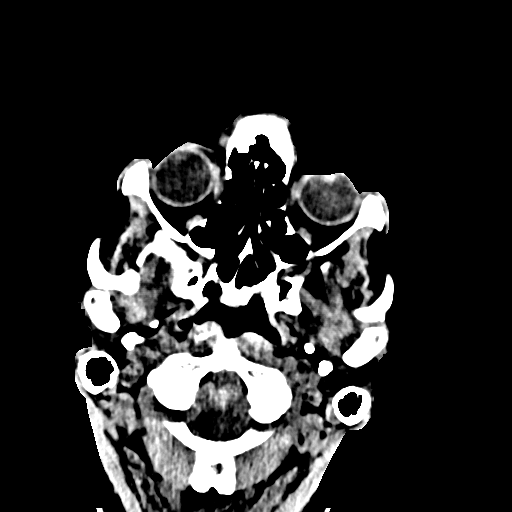

[Series 11: maxilllofacial 2.0 hr59 3 · axial · 0.43mm/px · z∈[-372,-284]mm · 3 of 90 slices shown]
[im 23/90  brain]
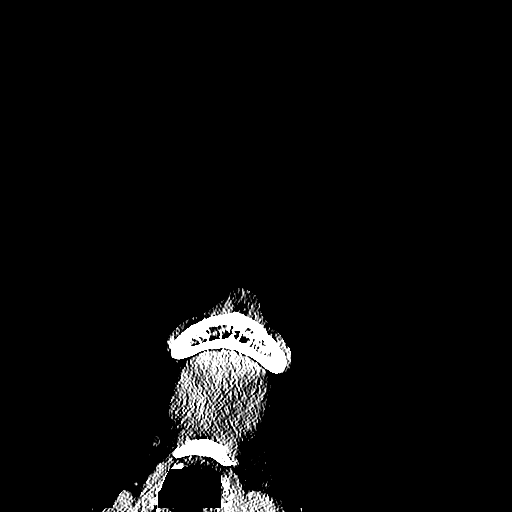
[im 45/90  brain]
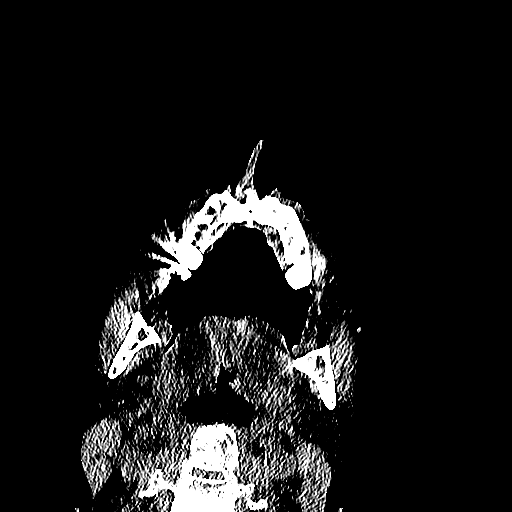
[im 67/90  brain]
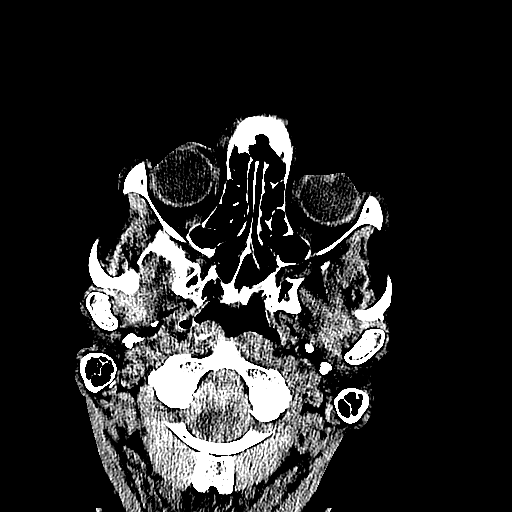

[Series 13: st cor · coronal · 0.38mm/px · 3 of 80 slices shown]
[im 43/80  brain]
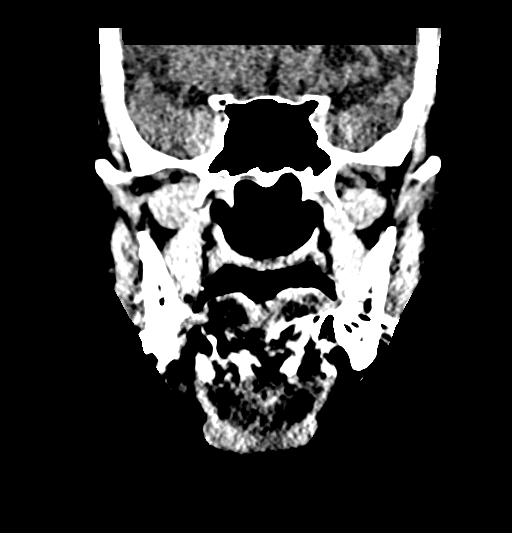
[im 55/80  brain]
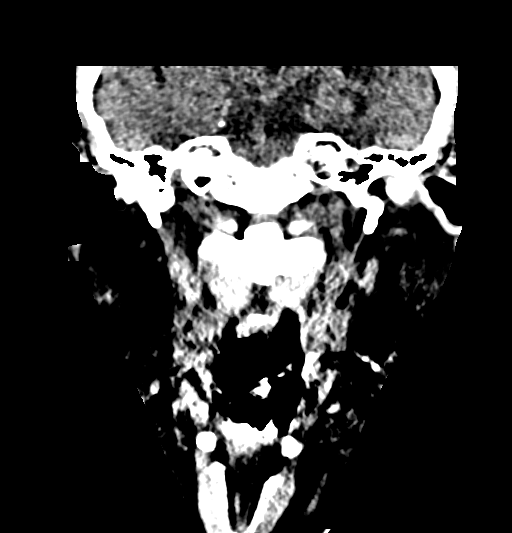
[im 67/80  brain]
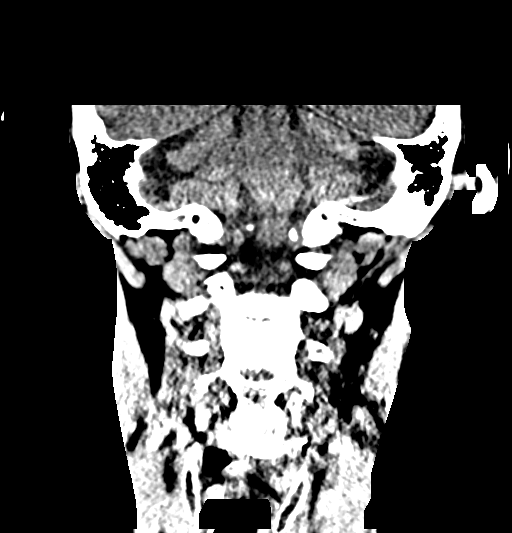

[Series 14: st sag · sagittal · 0.51mm/px · 1 of 73 slices shown]
[im 37/73  brain]
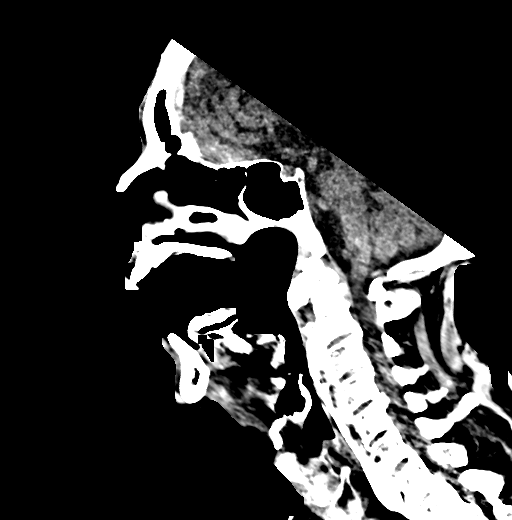

[Series 17: axial · axial · 0.42mm/px · z∈[-409,-329]mm · 3 of 97 slices shown (1 of 2)]
[im 25/97  brain]
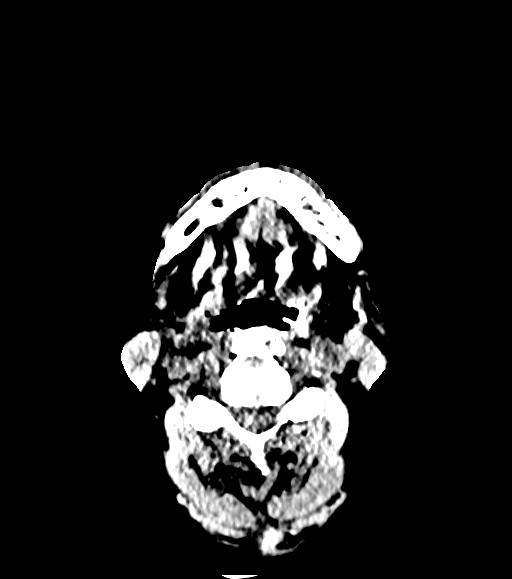
[im 49/97  brain]
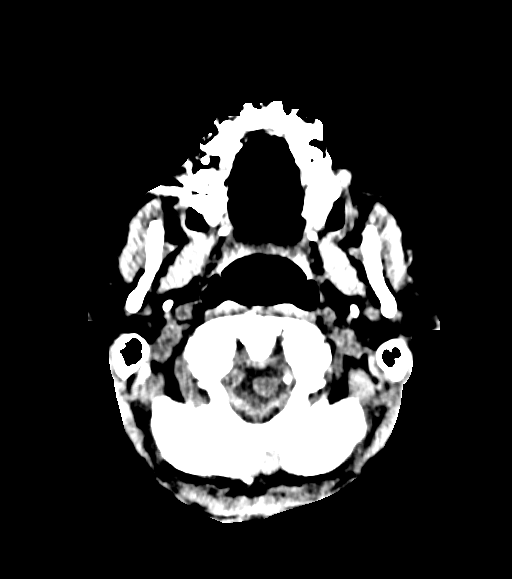
[im 73/97  brain]
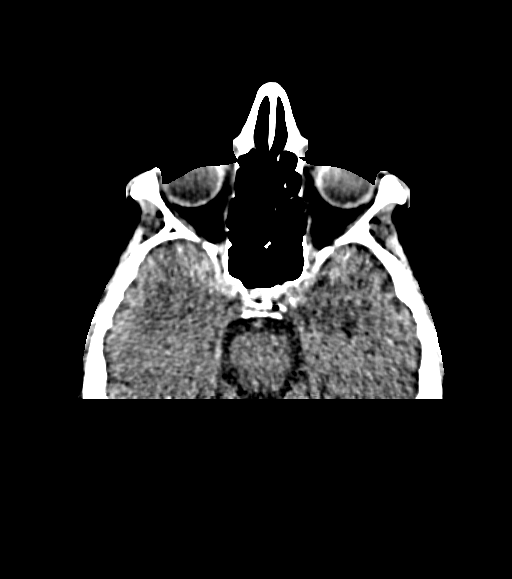

[Series 18: axial · axial · 0.42mm/px · z∈[-409,-329]mm · 3 of 97 slices shown (2 of 2)]
[im 25/97  brain]
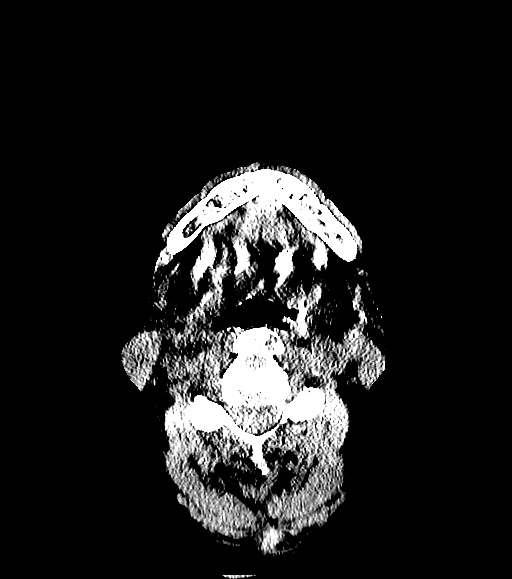
[im 49/97  brain]
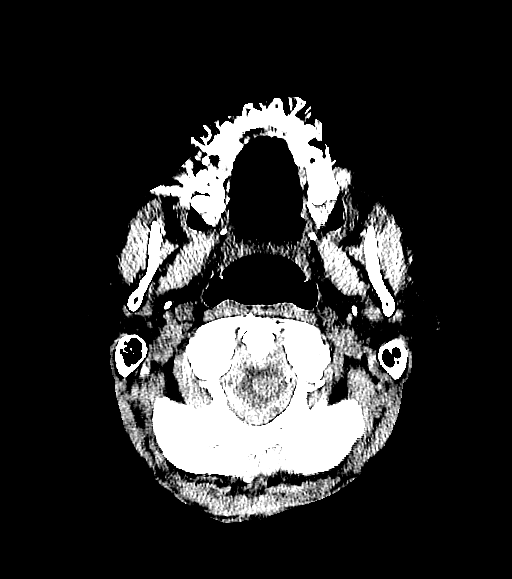
[im 73/97  brain]
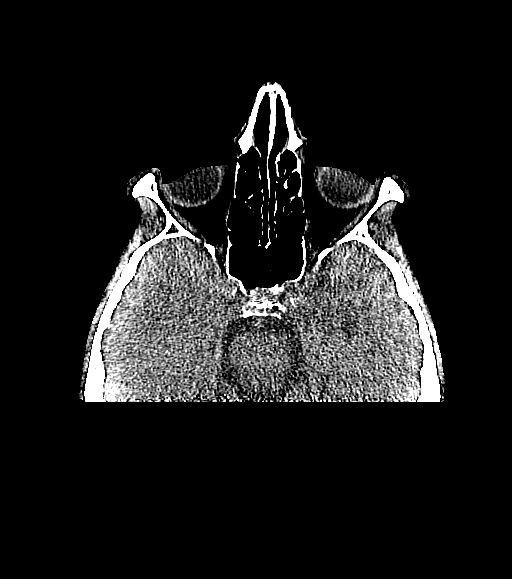

[16 of 47 positions shown; findings below may reference images not displayed]

FINDINGS: CT HEAD FINDINGS

Brain: Stable all left-sided MCA territory infarct. The brain
demonstrates no evidence of hemorrhage, infarction, edema, mass
effect, extra-axial fluid collection, hydrocephalus or mass lesion.
The skull is unremarkable.

Vascular: No hyperdense vessel or unexpected calcification.

Skull: Normal. Negative for fracture or focal lesion.

Other: None.

CT MAXILLOFACIAL FINDINGS

Osseous: No maxillofacial fracture or mandibular dislocation. No
destructive process. There is note made of a large bridging
osteophyte of the anterior cervical spine causing ankylosis of all
of the cervical vertebral bodies anteriorly.

Orbits: Negative. No traumatic or inflammatory finding.

Sinuses: Clear.

Soft tissues: No visualized soft tissue masses or enlarged lymph
nodes. The visualized airway is normally patent.
IMPRESSION: 1. Stable evidence of old left-sided MCA territory cerebral
infarction. No acute findings by head CT.
2. No acute findings by a maxillofacial CT. There is ankylosis
anteriorly of cervical vertebral bodies by a large bridging
osteophyte.

## 2019-10-04 DEATH — deceased
